# Patient Record
Sex: Male | Born: 1965 | Race: White | Hispanic: No | Marital: Married | Smoking: Never smoker
Health system: Southern US, Community
[De-identification: ages and names within clinical notes are randomized; demographics above are authoritative.]

## PROBLEM LIST (undated history)

## (undated) DIAGNOSIS — M545 Low back pain, unspecified: Secondary | ICD-10-CM

## (undated) DIAGNOSIS — F329 Major depressive disorder, single episode, unspecified: Secondary | ICD-10-CM

## (undated) DIAGNOSIS — K279 Peptic ulcer, site unspecified, unspecified as acute or chronic, without hemorrhage or perforation: Secondary | ICD-10-CM

## (undated) DIAGNOSIS — N19 Unspecified kidney failure: Secondary | ICD-10-CM

## (undated) DIAGNOSIS — D649 Anemia, unspecified: Secondary | ICD-10-CM

## (undated) DIAGNOSIS — E119 Type 2 diabetes mellitus without complications: Secondary | ICD-10-CM

## (undated) DIAGNOSIS — K219 Gastro-esophageal reflux disease without esophagitis: Secondary | ICD-10-CM

## (undated) DIAGNOSIS — I10 Essential (primary) hypertension: Secondary | ICD-10-CM

## (undated) DIAGNOSIS — F32A Depression, unspecified: Secondary | ICD-10-CM

## (undated) HISTORY — DX: Type 2 diabetes mellitus without complications: E11.9

## (undated) HISTORY — DX: Essential (primary) hypertension: I10

## (undated) HISTORY — PX: EXPLORATORY LAPAROTOMY: SUR591

## (undated) HISTORY — DX: Major depressive disorder, single episode, unspecified: F32.9

## (undated) HISTORY — DX: Low back pain: M54.5

## (undated) HISTORY — DX: Unspecified kidney failure: N19

## (undated) HISTORY — PX: LUMBAR FUSION: SHX111

## (undated) HISTORY — DX: Gastro-esophageal reflux disease without esophagitis: K21.9

## (undated) HISTORY — DX: Peptic ulcer, site unspecified, unspecified as acute or chronic, without hemorrhage or perforation: K27.9

## (undated) HISTORY — DX: Low back pain, unspecified: M54.50

## (undated) HISTORY — DX: Anemia, unspecified: D64.9

## (undated) HISTORY — DX: Depression, unspecified: F32.A

---

## 2005-11-07 ENCOUNTER — Emergency Department (HOSPITAL_COMMUNITY): Admission: EM | Admit: 2005-11-07 | Discharge: 2005-11-07 | Payer: Self-pay | Admitting: Emergency Medicine

## 2006-09-01 ENCOUNTER — Ambulatory Visit: Payer: Self-pay | Admitting: Specialist

## 2007-06-30 ENCOUNTER — Ambulatory Visit (HOSPITAL_COMMUNITY): Admission: RE | Admit: 2007-06-30 | Discharge: 2007-06-30 | Payer: Self-pay | Admitting: Neurosurgery

## 2008-10-28 ENCOUNTER — Encounter: Admission: RE | Admit: 2008-10-28 | Discharge: 2008-10-28 | Payer: Self-pay | Admitting: Neurosurgery

## 2008-12-23 ENCOUNTER — Ambulatory Visit (HOSPITAL_COMMUNITY): Admission: RE | Admit: 2008-12-23 | Discharge: 2008-12-24 | Payer: Self-pay | Admitting: Neurosurgery

## 2008-12-24 ENCOUNTER — Ambulatory Visit: Payer: Self-pay | Admitting: Pulmonary Disease

## 2008-12-24 ENCOUNTER — Ambulatory Visit: Payer: Self-pay | Admitting: Surgery

## 2008-12-24 ENCOUNTER — Inpatient Hospital Stay (HOSPITAL_COMMUNITY): Admission: EM | Admit: 2008-12-24 | Discharge: 2009-01-20 | Payer: Self-pay | Admitting: Emergency Medicine

## 2009-01-09 ENCOUNTER — Ambulatory Visit: Payer: Self-pay | Admitting: Physical Medicine & Rehabilitation

## 2009-01-12 ENCOUNTER — Ambulatory Visit: Payer: Self-pay | Admitting: Gastroenterology

## 2009-01-20 ENCOUNTER — Inpatient Hospital Stay (HOSPITAL_COMMUNITY)
Admission: RE | Admit: 2009-01-20 | Discharge: 2009-01-25 | Payer: Self-pay | Admitting: Physical Medicine & Rehabilitation

## 2009-01-21 ENCOUNTER — Ambulatory Visit: Payer: Self-pay | Admitting: Physical Medicine & Rehabilitation

## 2009-01-23 ENCOUNTER — Ambulatory Visit: Payer: Self-pay | Admitting: Vascular Surgery

## 2009-02-06 ENCOUNTER — Ambulatory Visit: Payer: Self-pay | Admitting: Surgery

## 2009-02-08 ENCOUNTER — Encounter
Admission: RE | Admit: 2009-02-08 | Discharge: 2009-05-09 | Payer: Self-pay | Admitting: Physical Medicine & Rehabilitation

## 2009-02-22 ENCOUNTER — Ambulatory Visit: Payer: Self-pay | Admitting: Internal Medicine

## 2009-02-22 DIAGNOSIS — D6489 Other specified anemias: Secondary | ICD-10-CM | POA: Insufficient documentation

## 2009-02-22 DIAGNOSIS — F418 Other specified anxiety disorders: Secondary | ICD-10-CM | POA: Insufficient documentation

## 2009-02-22 DIAGNOSIS — I10 Essential (primary) hypertension: Secondary | ICD-10-CM | POA: Insufficient documentation

## 2009-02-22 DIAGNOSIS — D649 Anemia, unspecified: Secondary | ICD-10-CM | POA: Insufficient documentation

## 2009-02-22 DIAGNOSIS — M109 Gout, unspecified: Secondary | ICD-10-CM | POA: Insufficient documentation

## 2009-02-22 DIAGNOSIS — M545 Low back pain, unspecified: Secondary | ICD-10-CM | POA: Insufficient documentation

## 2009-02-22 DIAGNOSIS — K219 Gastro-esophageal reflux disease without esophagitis: Secondary | ICD-10-CM | POA: Insufficient documentation

## 2009-02-22 LAB — CONVERTED CEMR LAB
ALT: 32 units/L (ref 0–53)
Basophils Relative: 0.5 % (ref 0.0–3.0)
Bilirubin, Direct: 0.3 mg/dL (ref 0.0–0.3)
Chloride: 110 meq/L (ref 96–112)
Eosinophils Relative: 1.8 % (ref 0.0–5.0)
HCT: 34.5 % — ABNORMAL LOW (ref 39.0–52.0)
Hemoglobin: 11.7 g/dL — ABNORMAL LOW (ref 13.0–17.0)
Leukocytes, UA: NEGATIVE
Lymphs Abs: 2.2 10*3/uL (ref 0.7–4.0)
MCV: 86.9 fL (ref 78.0–100.0)
Monocytes Absolute: 0.5 10*3/uL (ref 0.1–1.0)
Neutro Abs: 3.2 10*3/uL (ref 1.4–7.7)
Neutrophils Relative %: 52.6 % (ref 43.0–77.0)
Potassium: 4 meq/L (ref 3.5–5.1)
RBC: 3.98 M/uL — ABNORMAL LOW (ref 4.22–5.81)
Saturation Ratios: 33.1 % (ref 20.0–50.0)
Sodium: 147 meq/L — ABNORMAL HIGH (ref 135–145)
Specific Gravity, Urine: 1.02 (ref 1.000–1.030)
Total Protein: 6.8 g/dL (ref 6.0–8.3)
Urine Glucose: NEGATIVE mg/dL
Urobilinogen, UA: 0.2 (ref 0.0–1.0)
Vitamin B-12: 585 pg/mL (ref 211–911)
WBC: 6 10*3/uL (ref 4.5–10.5)

## 2009-03-06 ENCOUNTER — Encounter
Admission: RE | Admit: 2009-03-06 | Discharge: 2009-04-20 | Payer: Self-pay | Admitting: Physical Medicine & Rehabilitation

## 2009-03-07 ENCOUNTER — Ambulatory Visit: Payer: Self-pay | Admitting: Internal Medicine

## 2009-03-08 ENCOUNTER — Ambulatory Visit: Payer: Self-pay | Admitting: Physical Medicine & Rehabilitation

## 2009-03-27 ENCOUNTER — Telehealth: Payer: Self-pay | Admitting: Internal Medicine

## 2009-03-29 ENCOUNTER — Telehealth: Payer: Self-pay | Admitting: Internal Medicine

## 2009-05-10 ENCOUNTER — Ambulatory Visit: Payer: Self-pay | Admitting: Internal Medicine

## 2009-05-16 ENCOUNTER — Telehealth: Payer: Self-pay | Admitting: Internal Medicine

## 2009-06-07 ENCOUNTER — Ambulatory Visit: Payer: Self-pay | Admitting: Physical Medicine & Rehabilitation

## 2009-06-07 ENCOUNTER — Encounter
Admission: RE | Admit: 2009-06-07 | Discharge: 2009-09-05 | Payer: Self-pay | Admitting: Physical Medicine & Rehabilitation

## 2009-07-10 ENCOUNTER — Ambulatory Visit: Payer: Self-pay | Admitting: Internal Medicine

## 2009-07-13 ENCOUNTER — Ambulatory Visit: Payer: Self-pay | Admitting: Psychology

## 2009-07-24 ENCOUNTER — Ambulatory Visit: Payer: Self-pay | Admitting: Internal Medicine

## 2009-07-28 ENCOUNTER — Ambulatory Visit: Payer: Self-pay | Admitting: Psychology

## 2009-07-31 ENCOUNTER — Other Ambulatory Visit (HOSPITAL_COMMUNITY): Admission: RE | Admit: 2009-07-31 | Discharge: 2009-08-16 | Payer: Self-pay | Admitting: Psychiatry

## 2009-07-31 ENCOUNTER — Ambulatory Visit: Payer: Self-pay | Admitting: Psychiatry

## 2009-08-11 ENCOUNTER — Ambulatory Visit: Payer: Self-pay | Admitting: Psychology

## 2009-08-18 ENCOUNTER — Telehealth: Payer: Self-pay | Admitting: Internal Medicine

## 2009-08-21 ENCOUNTER — Ambulatory Visit: Payer: Self-pay | Admitting: Psychology

## 2009-09-01 ENCOUNTER — Ambulatory Visit: Payer: Self-pay | Admitting: Psychology

## 2009-09-25 ENCOUNTER — Telehealth: Payer: Self-pay | Admitting: Internal Medicine

## 2009-09-27 ENCOUNTER — Telehealth: Payer: Self-pay | Admitting: Internal Medicine

## 2009-12-11 ENCOUNTER — Encounter
Admission: RE | Admit: 2009-12-11 | Discharge: 2009-12-13 | Payer: Self-pay | Admitting: Physical Medicine & Rehabilitation

## 2009-12-13 ENCOUNTER — Ambulatory Visit: Payer: Self-pay | Admitting: Physical Medicine & Rehabilitation

## 2009-12-18 ENCOUNTER — Ambulatory Visit: Payer: Self-pay | Admitting: Internal Medicine

## 2009-12-31 IMAGING — CR DG ABDOMEN 1V
1 series · 1 of 1 positions shown · non-contrast
Comparison: [DATE]

CLINICAL DATA: Hypotension, anemia, tachycardia, postoperative
bleeding evaluate for foreign body

ABDOMEN - 1 VIEW

[view not recorded]
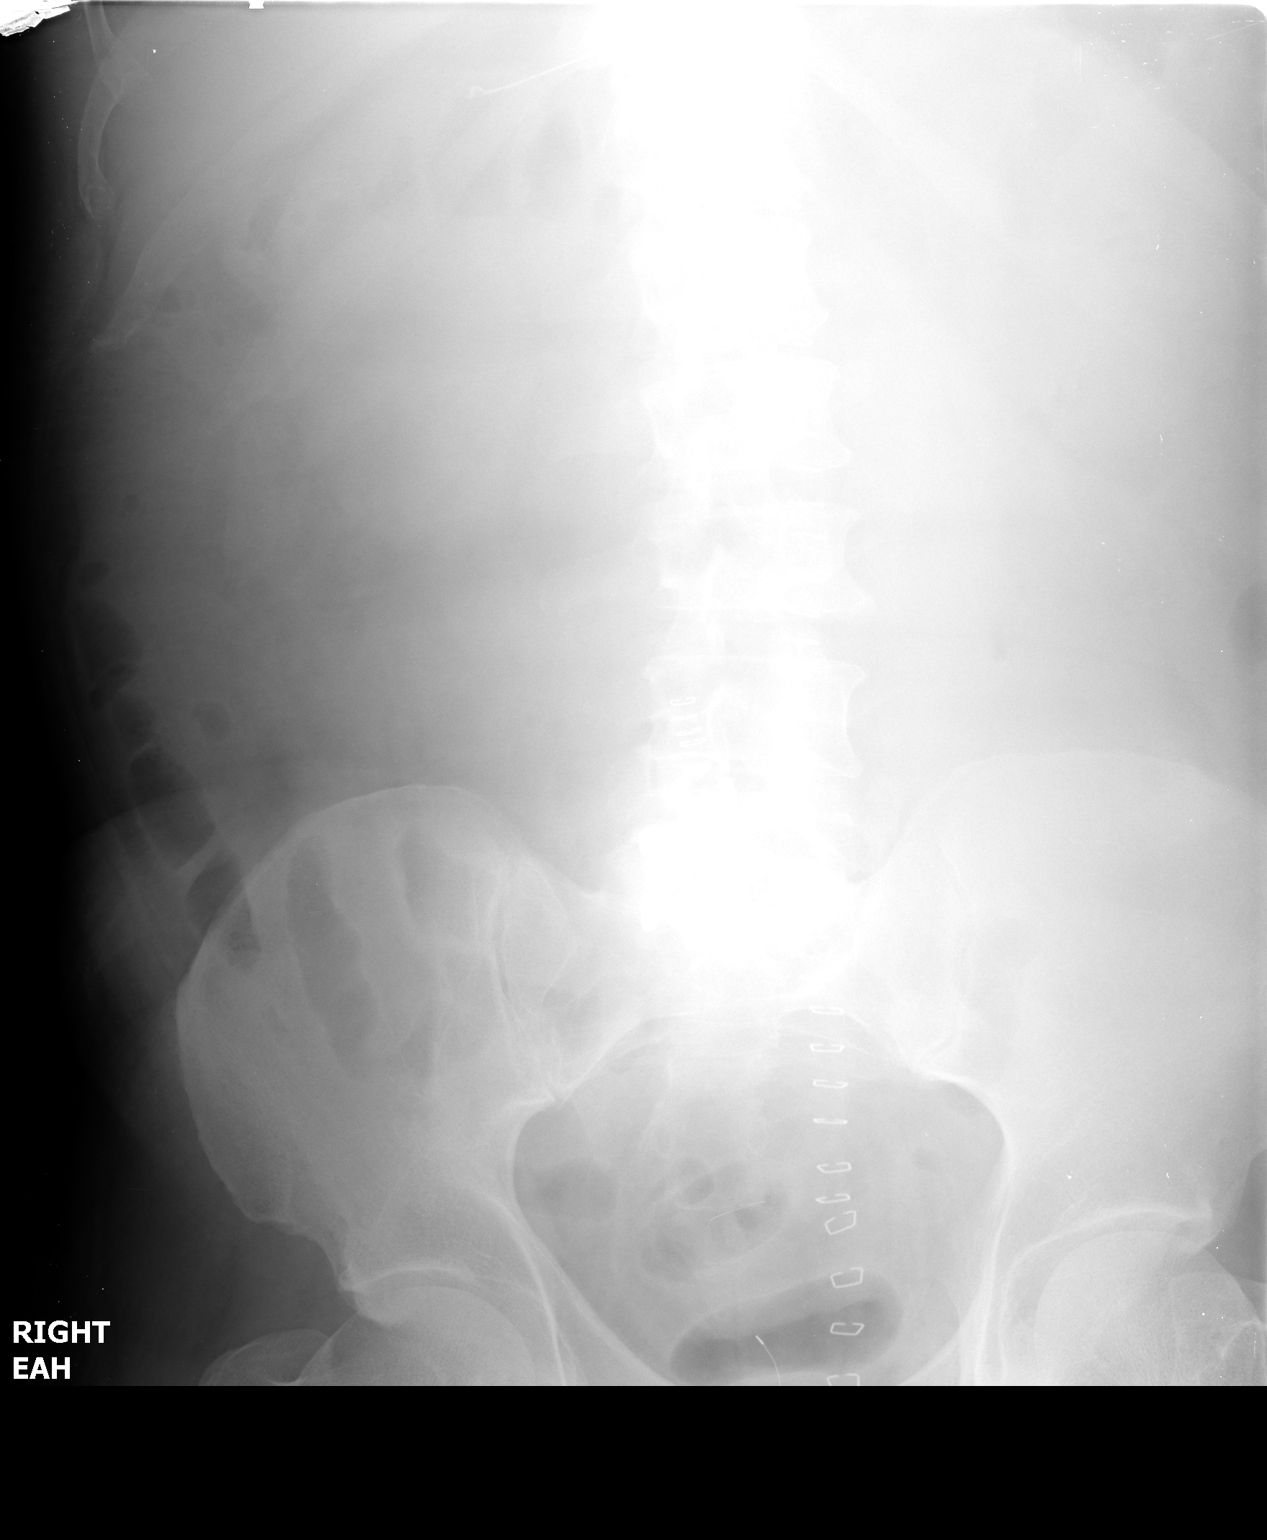

[1 of 1 positions shown; findings below may reference images not displayed]

FINDINGS: Midline staples are noted.  Previous fusion hardware at
L5- S1.  NG tube tip is seen in the distal stomach.  Imaged portion
of the abdomen demonstrates no other unexpected radiopaque foreign
body.  Entire left abdomen is not included on the study.  A rectal
temperature probe is noted.
IMPRESSION: Postop changes, as described.
No radiopaque unexpected foreign bodies in the imaged field.

## 2010-01-02 IMAGING — CR DG ABD PORTABLE 1V
1 series · 1 of 1 positions shown · non-contrast
Comparison: 12/31/2008

CLINICAL DATA: NG tube placement

ABDOMEN - 1 VIEW

[view not recorded]
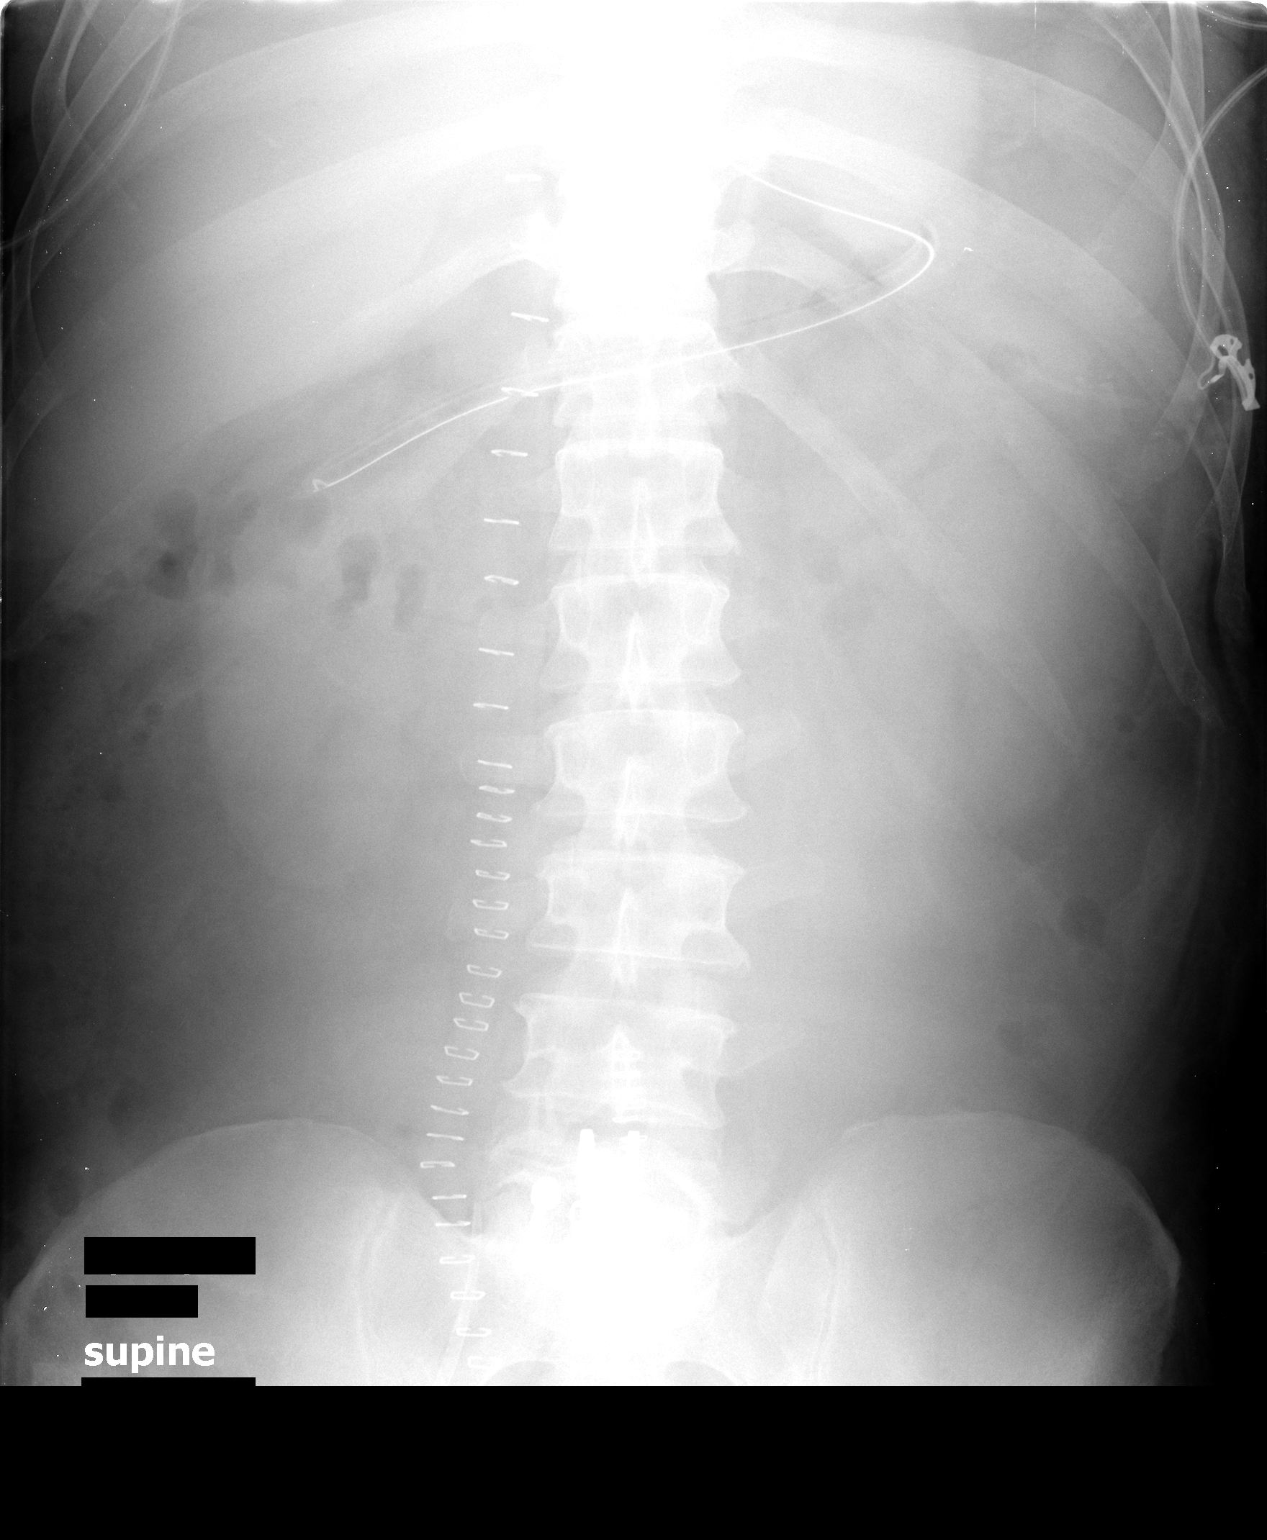

[1 of 1 positions shown; findings below may reference images not displayed]

FINDINGS: An NG tube is in place with its tip in the region of the
duodenal bulb or proximal duodenum.  New skin staples are noted
along the right abdomen and pelvis.  No acute or specific
abnormality of the bowel gas pattern.  A right femoral venous
catheter is in place with its tip in the right common iliac vein.
IMPRESSION: 1.  Tip of the NG tube in the region of the duodenal bulb or
proximal duodenum.
2.  Postoperative changes as above.
3.  No acute findings.

## 2010-01-02 IMAGING — CR DG CHEST 1V PORT
1 series · 1 of 1 positions shown · non-contrast
Comparison: 01/01/2009

CLINICAL DATA: Hypotension/tachycardia

PORTABLE CHEST - 1 VIEW

[view not recorded]
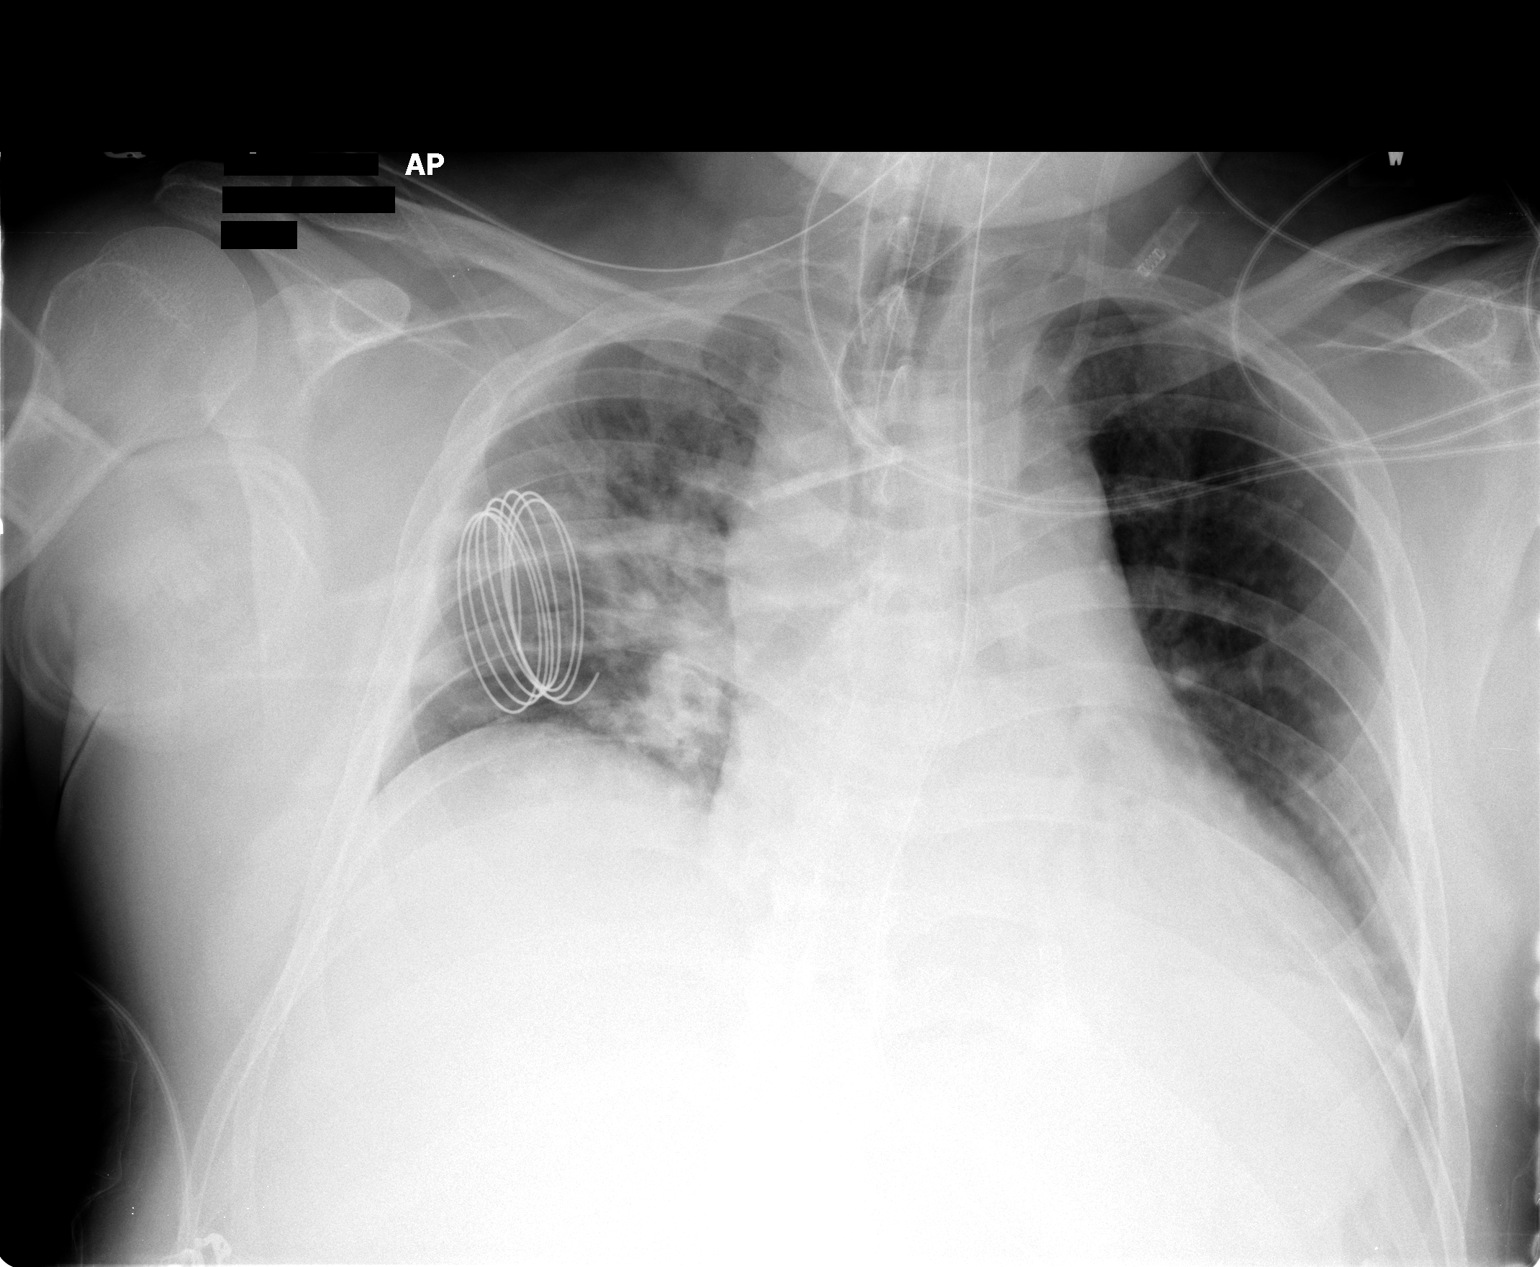

[1 of 1 positions shown; findings below may reference images not displayed]

FINDINGS: Bilateral airspace densities persist with slight interval
improvement of right basilar aeration.  Little overall change.
Support apparatus stable.
IMPRESSION: Slight interval improvement in right lung base aeration - otherwise
no significant change.

## 2010-01-05 IMAGING — CR DG CHEST 1V PORT
1 series · 1 of 1 positions shown · non-contrast
Comparison: 01/02/2009

CLINICAL DATA: Pulmonary edema.  Tachycardia.

PORTABLE CHEST - 1 VIEW

[view not recorded]
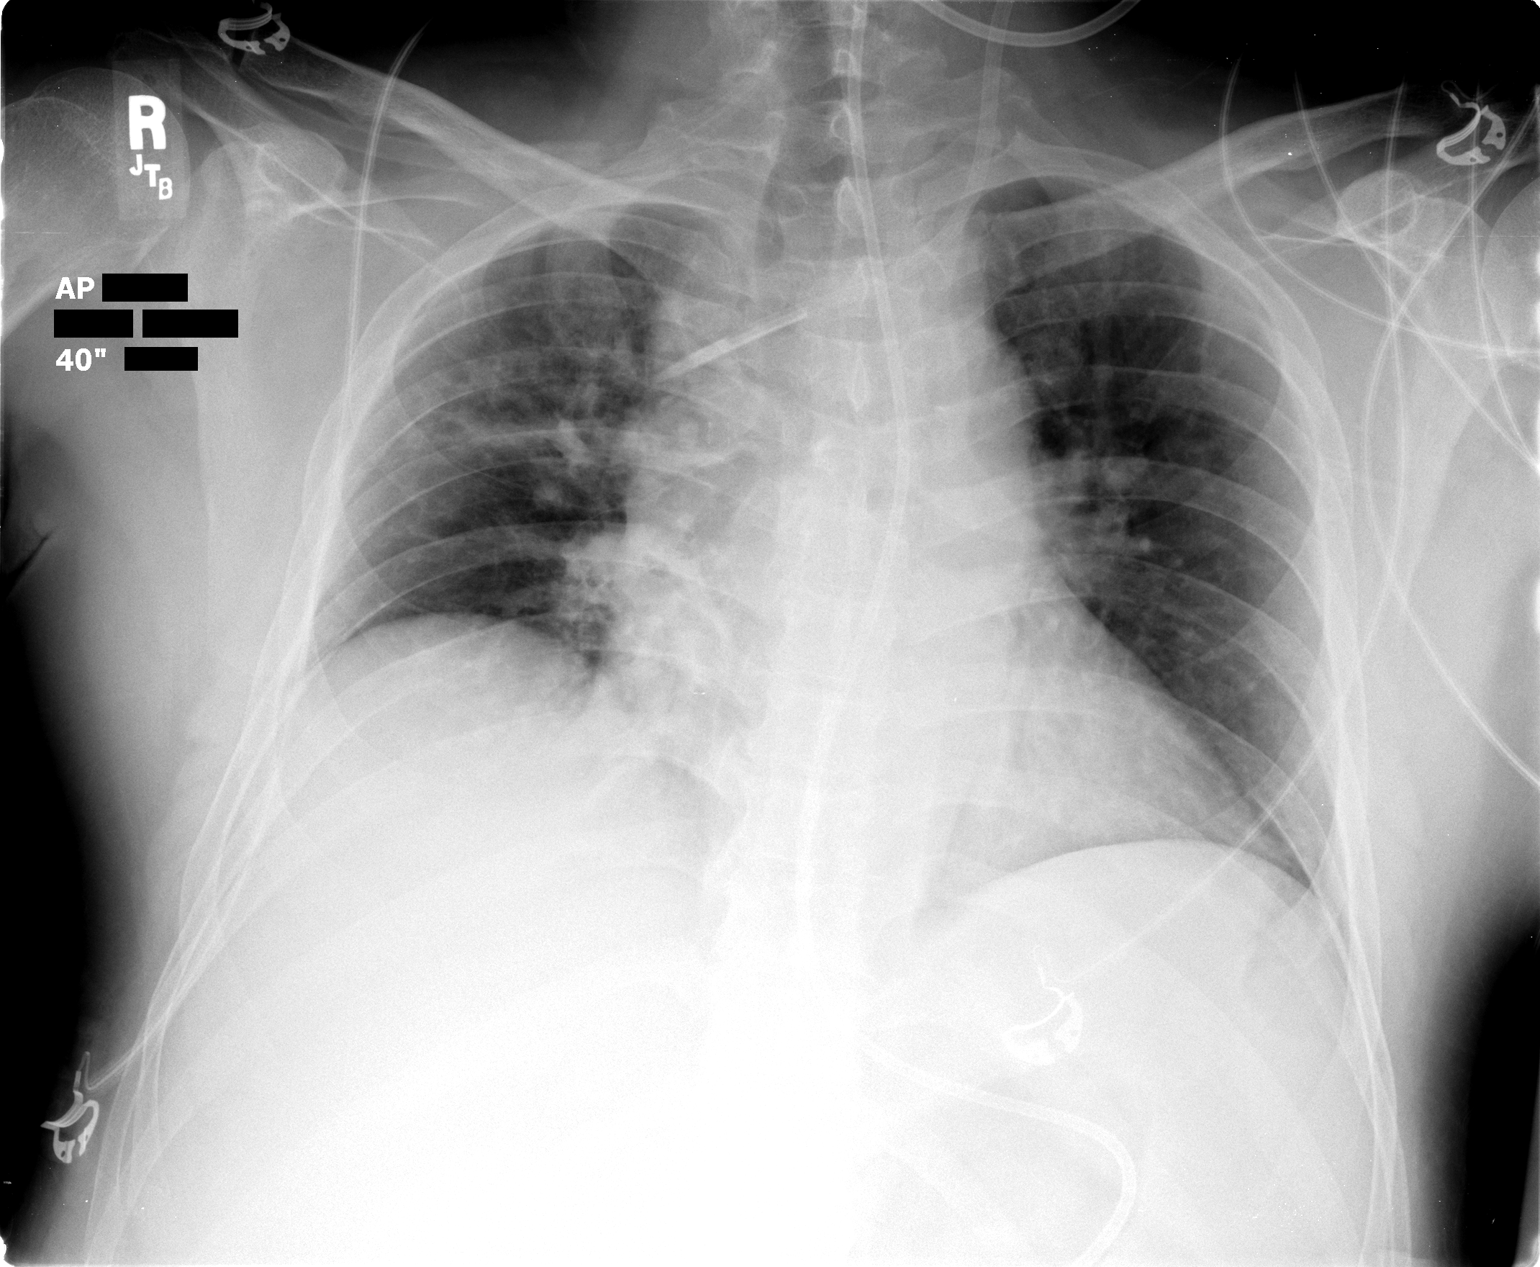

[1 of 1 positions shown; findings below may reference images not displayed]

FINDINGS: Feeding tube extends beyond the  inferior aspect of the
film.  Interval extubation.  Removal of nasogastric tube.

Left IJ central line unchanged tip at high SVC.

Midline trachea. Mild cardiomegaly.  Moderate right hemidiaphragm
elevation. No pneumothorax.  Improved left base air space disease.
Patchy right-sided airspace disease is slightly improved. No
congestive failure.
IMPRESSION: 1.  Improvement in right upper lobe and left lower lobe airspace
disease.
2.  Cardiomegaly without congestive failure.
3.  Interval extubation.

## 2010-01-08 ENCOUNTER — Telehealth: Payer: Self-pay | Admitting: Internal Medicine

## 2010-01-09 IMAGING — CR DG CHEST 1V PORT
1 series · 1 of 1 positions shown · non-contrast
Comparison: 01/05/2009

CLINICAL DATA: Shortness of breath and postoperative bleeding.

PORTABLE CHEST - 1 VIEW

[AP]
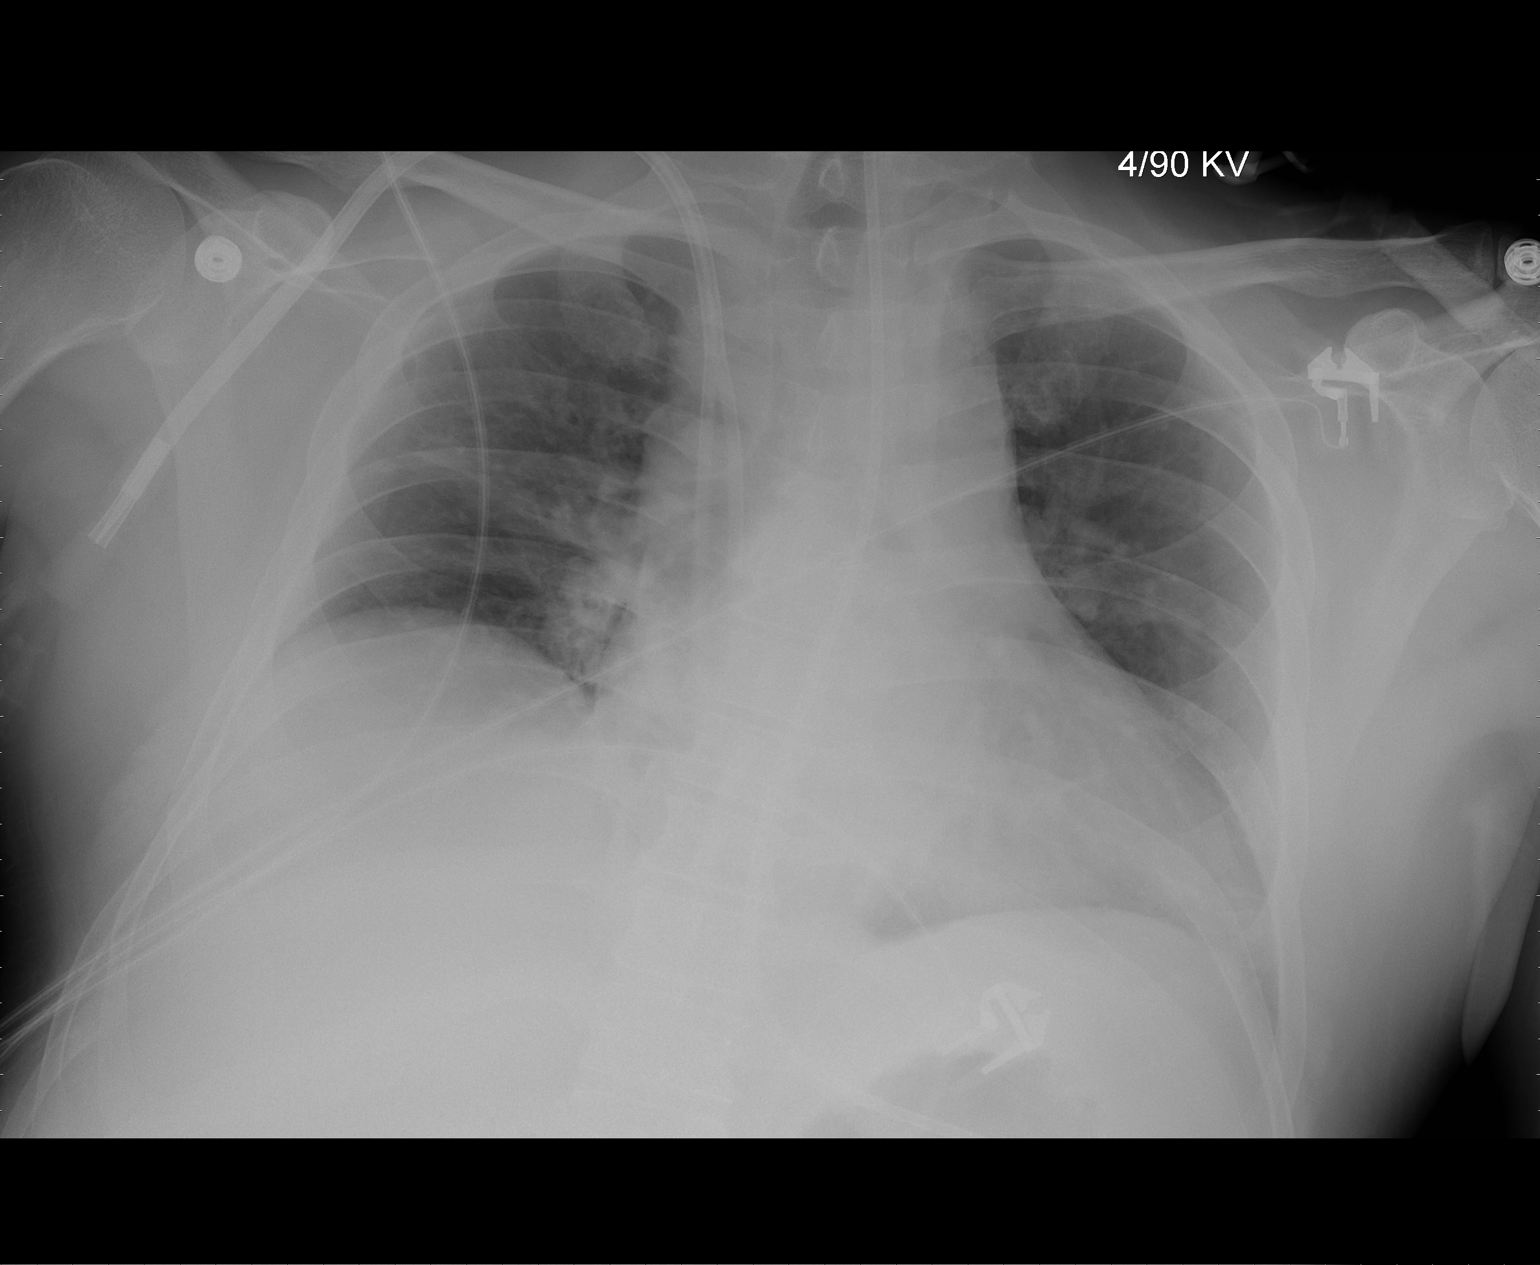

[1 of 1 positions shown; findings below may reference images not displayed]

FINDINGS: A right IJ catheter is noted with tips overlying the
lower SVC and upper right atrium.
A small bore feeding tube entering the stomach is again identified.
Slight improved aeration bilaterally is noted. Continued right lung
atelectasis again noted.
The cardiomediastinal silhouette is stable.
No large pleural effusions or evidence of pneumothorax is noted.
IMPRESSION: Slight improved bilateral aeration otherwise stable chest.

## 2010-02-19 ENCOUNTER — Ambulatory Visit: Payer: Self-pay | Admitting: Internal Medicine

## 2010-04-05 ENCOUNTER — Ambulatory Visit: Payer: Self-pay | Admitting: Internal Medicine

## 2010-04-05 DIAGNOSIS — M542 Cervicalgia: Secondary | ICD-10-CM | POA: Insufficient documentation

## 2010-04-05 DIAGNOSIS — M5412 Radiculopathy, cervical region: Secondary | ICD-10-CM | POA: Insufficient documentation

## 2010-04-06 ENCOUNTER — Telehealth: Payer: Self-pay | Admitting: Internal Medicine

## 2010-04-26 ENCOUNTER — Telehealth: Payer: Self-pay | Admitting: Internal Medicine

## 2010-05-04 ENCOUNTER — Ambulatory Visit: Payer: Self-pay | Admitting: Internal Medicine

## 2010-05-04 DIAGNOSIS — L03039 Cellulitis of unspecified toe: Secondary | ICD-10-CM

## 2010-05-04 DIAGNOSIS — L02619 Cutaneous abscess of unspecified foot: Secondary | ICD-10-CM | POA: Insufficient documentation

## 2010-05-18 ENCOUNTER — Ambulatory Visit: Payer: Self-pay | Admitting: Internal Medicine

## 2010-07-16 ENCOUNTER — Telehealth: Payer: Self-pay | Admitting: Internal Medicine

## 2010-08-01 ENCOUNTER — Telehealth: Payer: Self-pay | Admitting: Internal Medicine

## 2010-08-29 ENCOUNTER — Telehealth: Payer: Self-pay | Admitting: Internal Medicine

## 2010-09-19 ENCOUNTER — Ambulatory Visit: Payer: Self-pay | Admitting: Internal Medicine

## 2010-09-19 DIAGNOSIS — F528 Other sexual dysfunction not due to a substance or known physiological condition: Secondary | ICD-10-CM | POA: Insufficient documentation

## 2010-10-05 ENCOUNTER — Telehealth: Payer: Self-pay | Admitting: Internal Medicine

## 2010-11-13 NOTE — Progress Notes (Signed)
  Phone Note Outgoing Call   Summary of Call: LA: please let him know that his neck xray is abnormal and I think he should have an MRI done TJ Initial call taken by: Etta Grandchild MD,  April 06, 2010 6:48 AM  Follow-up for Phone Call        Patient wife notified.Marland KitchenMarland KitchenAlvy Beal Archie CMA  April 06, 2010 2:11 PM  Follow-up by: Etta Grandchild MD,  April 06, 2010 6:47 AM

## 2010-11-13 NOTE — Assessment & Plan Note (Signed)
Summary: neck & left shoulder pain/#/cd   Vital Signs:  Patient profile:   45 year old male Height:      70 inches Weight:      195.2 pounds O2 Sat:      98 % on Room air Temp:     97.7 degrees F oral Pulse rate:   70 / minute Pulse rhythm:   regular Resp:     16 per minute BP sitting:   138 / 80  (left arm) Cuff size:   regular  Vitals Entered By: Lanier Prude, CMA(AAMA) (April 05, 2010 3:51 PM)  O2 Flow:  Room air CC: lt side neck/shoulder painx 6 weeks, Neck pain Is Patient Diabetic? No Pain Assessment Patient in pain? no      Comments pt states he takes Adderrall 3 once daily.   Primary Care Provider:  Etta Grandchild MD  CC:  lt side neck/shoulder painx 6 weeks and Neck pain.  History of Present Illness:  Neck Pain      This is a 45 year old man who presents with Neck pain.  The problem began 4-8 weeks ago.  The intensity is described as moderate.  The patient reports left neck pain, but denies right neck pain and midline neck pain.  The patient denies the following associated symptoms: numbness, weakness, impaired coordination, gait disturbance, tingling/parasthesias, fever, bladder dysfunction, bowel dysfunction, locking, clicking, and impaired neck ROM.  The pain is described as dull, intermittent, and radiates to the left arm.  The pain is better with rest and muscle relaxants.    Preventive Screening-Counseling & Management  Alcohol-Tobacco     Alcohol drinks/day: 0     Smoking Status: never  Hep-HIV-STD-Contraception     Hepatitis Risk: no risk noted     HIV Risk: no risk noted     STD Risk: no risk noted      Sexual History:  currently monogamous.        Drug Use:  no.        Blood Transfusions:  yes.    Medications Prior to Update: 1)  Toprol Xl 100 Mg Xr24h-Tab (Metoprolol Succinate) .... One By Mouth Once Daily 2)  Alprazolam 0.5 Mg Tabs (Alprazolam) .... Take 1 Tab By Mouth At Bedtime 3)  Trazodone Hcl 50 Mg Tabs (Trazodone Hcl) .... 2-4 Tabs  Once Daily 4)  Adderall 20 Mg Tabs (Amphetamine-Dextroamphetamine) .... Take 2 Tablet By Mouth Two Times A Day 5)  Hydrocodone-Acetaminophen 10-650 Mg Tabs (Hydrocodone-Acetaminophen) .... One By Mouth Three Times A Day As Needed For Pain 6)  Lexapro 20 Mg Tabs (Escitalopram Oxalate) .... One By Mouth Once Daily  Current Medications (verified): 1)  Toprol Xl 100 Mg Xr24h-Tab (Metoprolol Succinate) .... One By Mouth Once Daily 2)  Alprazolam 0.5 Mg Tabs (Alprazolam) .... Take 1 Tab By Mouth At Bedtime 3)  Trazodone Hcl 50 Mg Tabs (Trazodone Hcl) .... 2-4 Tabs Once Daily 4)  Adderall 20 Mg Tabs (Amphetamine-Dextroamphetamine) .... Take 2 Tablet By Mouth Two Times A Day 5)  Hydrocodone-Acetaminophen 10-650 Mg Tabs (Hydrocodone-Acetaminophen) .... One By Mouth Three Times A Day As Needed For Pain 6)  Lexapro 20 Mg Tabs (Escitalopram Oxalate) .... One By Mouth Once Daily 7)  Amrix 15 Mg Xr24h-Cap (Cyclobenzaprine Hcl) .... One By Mouth Once Daily As Needed For Neck Pain  Allergies (verified): 1)  ! Nsaids  Past History:  Past Medical History: Last updated: 07/10/2009 Anemia-NOS GERD Gout Hypertension Low back pain- Dr. Mikal Plane Peptic ulcer  disease Renal failure- Dr. Kathrene Bongo depression  Past Surgical History: Last updated: 02/22/2009 Lumbar fusion Laparotomy-exploratory  Family History: Last updated: 02/22/2009 Family History Diabetes 1st degree relative Family History Hypertension  Social History: Last updated: 02/22/2009 Occupation: Chartered certified accountant Married Never Smoked Alcohol use-no Drug use-no Regular exercise-no  Risk Factors: Alcohol Use: 0 (04/05/2010) Exercise: no (02/22/2009)  Risk Factors: Smoking Status: never (04/05/2010)  Family History: Reviewed history from 02/22/2009 and no changes required. Family History Diabetes 1st degree relative Family History Hypertension  Social History: Reviewed history from 02/22/2009 and no changes  required. Occupation: Chartered certified accountant Married Never Smoked Alcohol use-no Drug use-no Regular exercise-no Hepatitis Risk:  no risk noted HIV Risk:  no risk noted STD Risk:  no risk noted  Review of Systems  The patient denies anorexia, fever, weight loss, chest pain, syncope, dyspnea on exertion, peripheral edema, prolonged cough, headaches, hemoptysis, abdominal pain, hematuria, suspicious skin lesions, and enlarged lymph nodes.    Physical Exam  General:  alert, well-developed, well-nourished, and cooperative to examination.   appropriate dress, normal appearance, healthy-appearing, cooperative to examination, and good hygiene.   Head:  normocephalic, atraumatic, and no abnormalities observed.   Mouth:  Oral mucosa and oropharynx without lesions or exudates.  Teeth in good repair. Neck:  supple, full ROM, and no masses.   Lungs:  normal respiratory effort, no intercostal retractions, no accessory muscle use, normal breath sounds, no dullness, and no fremitus.   Heart:  normal rate, regular rhythm, no murmur, no gallop, no rub, and no JVD.   Abdomen:  Bowel sounds positive,abdomen soft and non-tender without masses, organomegaly or hernias noted. Msk:  normal ROM, no joint tenderness, no joint swelling, no joint warmth, no redness over joints, no joint deformities, no joint instability, and no crepitation.   Pulses:  R and L carotid,radial,femoral,dorsalis pedis and posterior tibial pulses are full and equal bilaterally Extremities:  No clubbing, cyanosis, edema, or deformity noted with normal full range of motion of all joints.   Neurologic:  No cranial nerve deficits noted. Station and gait are normal. Plantar reflexes are down-going bilaterally. DTRs are symmetrical throughout. Sensory, motor and coordinative functions appear intact. Skin:  turgor normal, color normal, no rashes, no suspicious lesions, no ecchymoses, no petechiae, no purpura, no ulcerations, and no edema.   Cervical Nodes:   No lymphadenopathy noted Axillary Nodes:  No palpable lymphadenopathy Psych:  Cognition and judgment appear intact. Alert and cooperative with normal attention span and concentration. No apparent delusions, illusions, hallucinations   Detailed Back/Spine Exam  General:    Well-developed, well-nourished, in no acute distress; alert and oriented x 3.    Gait:    Normal heel-toe gait pattern bilaterally.    Skin:    Intact with no erythema; no scarring.    Cervical Exam:  Inspection-deformity:    Normal Palpation-spinal tenderness:  Normal Range of Motion:    Forward Flexion:   60 degrees    Hyperextension:   75 degrees    Right Lat. Flexion:   45 degrees    Left Lat. Flexion:   45 degrees    Right Lat. Rotation:   80 degrees    Left Lat. Rotation:   80 degrees Spurling Maneuver:    negative Hoffman's Sign:    Right:  negative    Left:  negative   Impression & Recommendations:  Problem # 1:  NECK PAIN, LEFT (ICD-723.1) Assessment New  His updated medication list for this problem includes:    Hydrocodone-acetaminophen 10-650 Mg  Tabs (Hydrocodone-acetaminophen) ..... One by mouth three times a day as needed for pain    Amrix 15 Mg Xr24h-cap (Cyclobenzaprine hcl) ..... One by mouth once daily as needed for neck pain  Orders: T-Cervical Spine Comp 4 Views (72050TC)  Problem # 2:  CERVICAL RADICULOPATHY, LEFT (ICD-723.4) Assessment: New will start with plain films but he may need an MRI Orders: T-Cervical Spine Comp 4 Views (72050TC)  Complete Medication List: 1)  Toprol Xl 100 Mg Xr24h-tab (Metoprolol succinate) .... One by mouth once daily 2)  Alprazolam 0.5 Mg Tabs (Alprazolam) .... Take 1 tab by mouth at bedtime 3)  Trazodone Hcl 50 Mg Tabs (Trazodone hcl) .... 2-4 tabs once daily 4)  Adderall 20 Mg Tabs (Amphetamine-dextroamphetamine) .... Take 2 tablet by mouth two times a day 5)  Hydrocodone-acetaminophen 10-650 Mg Tabs (Hydrocodone-acetaminophen) .... One by  mouth three times a day as needed for pain 6)  Lexapro 20 Mg Tabs (Escitalopram oxalate) .... One by mouth once daily 7)  Amrix 15 Mg Xr24h-cap (Cyclobenzaprine hcl) .... One by mouth once daily as needed for neck pain  Patient Instructions: 1)  Please schedule a follow-up appointment in 2 weeks. Prescriptions: HYDROCODONE-ACETAMINOPHEN 10-650 MG TABS (HYDROCODONE-ACETAMINOPHEN) One by mouth three times a day as needed for pain  #90 x 2   Entered and Authorized by:   Etta Grandchild MD   Signed by:   Etta Grandchild MD on 04/05/2010   Method used:   Print then Give to Patient   RxID:   [id number] AMRIX 15 MG XR24H-CAP (CYCLOBENZAPRINE HCL) One by mouth once daily as needed for neck pain  #10 x 0   Entered and Authorized by:   Etta Grandchild MD   Signed by:   Etta Grandchild MD on 04/05/2010   Method used:   Samples Given   RxID:   [id number]

## 2010-11-13 NOTE — Progress Notes (Signed)
Summary: refill request  Phone Note Refill Request Message from:  Fax from Pharmacy on July 16, 2010 3:08 PM  Refills Requested: Medication #1:  HYDROCODONE-ACETAMINOPHEN 10-650 MG TABS One by mouth three times a day as needed for pain   Dosage confirmed as above?Dosage Confirmed   Supply Requested: 1 month   Last Refilled: 06/14/2010  Is this ok to refill for pt?  [facility]  Initial call taken by: Rock Nephew CMA,  July 16, 2010 3:09 PM  Follow-up for Phone Call        ok RF X three Follow-up by: Etta Grandchild MD,  July 16, 2010 3:21 PM    Prescriptions: HYDROCODONE-ACETAMINOPHEN 10-650 MG TABS (HYDROCODONE-ACETAMINOPHEN) One by mouth three times a day as needed for pain  #90 x 3   Entered by:   Rock Nephew CMA   Authorized by:   Etta Grandchild MD   Signed by:   Rock Nephew CMA on 07/16/2010   Method used:   Telephoned to ...       [id number] ([facility]       [address]       [place], [place]  [address]       Ph: [phone]       Fax: [phone]   RxID:   [id number]

## 2010-11-13 NOTE — Assessment & Plan Note (Signed)
Summary: left big toe ?infection/inj/cd   Vital Signs:  Patient profile:   45 year old male Height:      70 inches Weight:      197 pounds BMI:     28.37 O2 Sat:      97 % on Room air Temp:     97.0 degrees F oral Pulse rate:   64 / minute Pulse rhythm:   regular Resp:     16 per minute BP sitting:   112 / 70  (left arm) Cuff size:   large  Vitals Entered By: Rock Nephew CMA (May 04, 2010 11:20 AM)  Nutrition Counseling: Patient's BMI is greater than 25 and therefore counseled on weight management options.  O2 Flow:  Room air  Primary Care Provider:  Etta Grandchild MD   History of Present Illness: He returns c/o 3 week hx. of worsening pain, redness, and swelling in his left great toe.  Current Medications (verified): 1)  Toprol Xl 100 Mg Xr24h-Tab (Metoprolol Succinate) .... One By Mouth Once Daily 2)  Alprazolam 0.5 Mg Tabs (Alprazolam) .... Take 1 Tab By Mouth At Bedtime 3)  Trazodone Hcl 50 Mg Tabs (Trazodone Hcl) .... 2-4 Tabs Once Daily 4)  Adderall 20 Mg Tabs (Amphetamine-Dextroamphetamine) .... Take 2 Tablet By Mouth Two Times A Day 5)  Hydrocodone-Acetaminophen 10-650 Mg Tabs (Hydrocodone-Acetaminophen) .... One By Mouth Three Times A Day As Needed For Pain 6)  Lexapro 20 Mg Tabs (Escitalopram Oxalate) .... One By Mouth Once Daily 7)  Amrix 15 Mg Xr24h-Cap (Cyclobenzaprine Hcl) .... One By Mouth Once Daily As Needed For Neck Pain  Allergies (verified): 1)  ! Nsaids  Review of Systems  The patient denies anorexia, fever, and abdominal pain.   General:  Denies chills, fatigue, fever, loss of appetite, and malaise.  Physical Exam  General:  Well-developed, well-nourished, in no acute distress; alert and oriented x 3.   Head:  normocephalic, atraumatic, no abnormalities observed, and no abnormalities palpated.   Eyes:  vision grossly intact and pupils equal.   Mouth:  Oral mucosa and oropharynx without lesions or exudates.  Teeth in good repair. Neck:   supple, full ROM, and no masses.   Lungs:  normal respiratory effort, no intercostal retractions, no accessory muscle use, normal breath sounds, no dullness, and no fremitus.   Heart:  normal rate, regular rhythm, no murmur, no gallop, no rub, and no JVD.   Abdomen:  Bowel sounds positive,abdomen soft and non-tender without masses, organomegaly or hernias noted. Msk:  normal ROM, no joint deformities, no joint instability, and no crepitation.   Pulses:  R and L carotid,radial,femoral,dorsalis pedis and posterior tibial pulses are full and equal bilaterally Extremities:  there is diffuse erythema and warmth on the dorsum of the left great toe but the skin is intact and there is no induration, fluctuance, paronychia, or exudate. There is no proximal streaking. The nail bed and nail plate are intact with no pus. Neurologic:  No cranial nerve deficits noted. Station and gait are normal. Plantar reflexes are down-going bilaterally. DTRs are symmetrical throughout. Sensory, motor and coordinative functions appear intact. Skin:  no rashes, no suspicious lesions, no ecchymoses, no petechiae, no purpura, no ulcerations, and no edema.   Psych:  Cognition and judgment appear intact. Alert and cooperative with normal attention span and concentration. No apparent delusions, illusions, hallucinations   Impression & Recommendations:  Problem # 1:  CELLULITIS/ABSCESS, TOE NOS (ICD-681.10) Assessment New  His updated medication list  for this problem includes:    Ceftin 500 Mg Tab (Cefuroxime axetil) .Marland Kitchen... Take one (1) tablet by mouth two (2) times a day x 14 days    Sulfamethoxazole-tmp Ds 800-160 Mg Tab (Trimethoprim-sulfamethoxazole) .Marland Kitchen... Take 1 tablet by mouth morning and night for 14 days  Elevate affected area. Warm moist compresses for 20 minutes every 2 hours while awake. Take antibiotics as directed and take acetaminophen as needed. To be seen in 48-72 hours if no improvement, sooner if  worse.  Complete Medication List: 1)  Toprol Xl 100 Mg Xr24h-tab (Metoprolol succinate) .... One by mouth once daily 2)  Alprazolam 0.5 Mg Tabs (Alprazolam) .... Take 1 tab by mouth at bedtime 3)  Trazodone Hcl 50 Mg Tabs (Trazodone hcl) .... 2-4 tabs once daily 4)  Adderall 20 Mg Tabs (Amphetamine-dextroamphetamine) .... Take 2 tablet by mouth two times a day 5)  Hydrocodone-acetaminophen 10-650 Mg Tabs (Hydrocodone-acetaminophen) .... One by mouth three times a day as needed for pain 6)  Lexapro 20 Mg Tabs (Escitalopram oxalate) .... One by mouth once daily 7)  Amrix 15 Mg Xr24h-cap (Cyclobenzaprine hcl) .... One by mouth once daily as needed for neck pain 8)  Ceftin 500 Mg Tab (Cefuroxime axetil) .... Take one (1) tablet by mouth two (2) times a day x 14 days 9)  Sulfamethoxazole-tmp Ds 800-160 Mg Tab (Trimethoprim-sulfamethoxazole) .... Take 1 tablet by mouth morning and night for 14 days  Patient Instructions: 1)  Please schedule a follow-up appointment in 2 weeks. 2)  Take your antibiotic as prescribed until ALL of it is gone, but stop if you develop a rash or swelling and contact our office as soon as possible. Prescriptions: SULFAMETHOXAZOLE-TMP DS 800-160 MG TAB (TRIMETHOPRIM-SULFAMETHOXAZOLE) Take 1 tablet by mouth morning and night for 14 days  #28 x 1   Entered and Authorized by:   Etta Grandchild MD   Signed by:   Etta Grandchild MD on 05/04/2010   Method used:   Electronically to        [facility]. [phone]* (retail)       [address]       [place], [place]  [address]       Ph: [phone]       Fax: [phone]   RxID:   [id number] CEFTIN 500 MG TAB (CEFUROXIME AXETIL) Take one (1) tablet by mouth two (2) times a day X 14 days  #28 x 1   Entered and Authorized by:   Etta Grandchild MD   Signed by:   Etta Grandchild MD on 05/04/2010   Method used:   Electronically to        [facility]. [phone]* (retail)        [address]       [place], [place]  [address]       Ph: [phone]       Fax: [phone]   RxID:   [id number]    Immunization History:  Tetanus/Td Immunization History:    Tetanus/Td:  historical (10/14/2008)

## 2010-11-13 NOTE — Assessment & Plan Note (Signed)
Summary: 2 wk rov [name]   #   Vital Signs:  Patient profile:   45 year old male Height:      70 inches Weight:      203 pounds BMI:     29.23 O2 Sat:      98 % on Room air Temp:     97.2 degrees F oral Pulse rate:   80 / minute Pulse rhythm:   regular Resp:     16 per minute BP sitting:   124 / 80  (left arm) Cuff size:   large  Vitals Entered By: Rock Nephew CMA (May 18, 2010 3:44 PM)  O2 Flow:  Room air CC: follow-up visit// Left toe recheck Is Patient Diabetic? No   Primary Care Provider:  Etta Grandchild MD  CC:  follow-up visit// Left toe recheck.  History of Present Illness: He returns for f/up on left great toe infection-it is doing better with no pain, redness, swelling, or drainage. He has tolerated the antibiotics well.  Preventive Screening-Counseling & Management  Alcohol-Tobacco     Alcohol drinks/day: 0     Smoking Status: never  Medications Prior to Update: 1)  Toprol Xl 100 Mg Xr24h-Tab (Metoprolol Succinate) .... One By Mouth Once Daily 2)  Alprazolam 0.5 Mg Tabs (Alprazolam) .... Take 1 Tab By Mouth At Bedtime 3)  Trazodone Hcl 50 Mg Tabs (Trazodone Hcl) .... 2-4 Tabs Once Daily 4)  Adderall 20 Mg Tabs (Amphetamine-Dextroamphetamine) .... Take 2 Tablet By Mouth Two Times A Day 5)  Hydrocodone-Acetaminophen 10-650 Mg Tabs (Hydrocodone-Acetaminophen) .... One By Mouth Three Times A Day As Needed For Pain 6)  Lexapro 20 Mg Tabs (Escitalopram Oxalate) .... One By Mouth Once Daily 7)  Amrix 15 Mg Xr24h-Cap (Cyclobenzaprine Hcl) .... One By Mouth Once Daily As Needed For Neck Pain  Current Medications (verified): 1)  Toprol Xl 100 Mg Xr24h-Tab (Metoprolol Succinate) .... One By Mouth Once Daily 2)  Alprazolam 0.5 Mg Tabs (Alprazolam) .... Take 1 Tab By Mouth At Bedtime 3)  Trazodone Hcl 50 Mg Tabs (Trazodone Hcl) .... 2-4 Tabs Once Daily 4)  Adderall 20 Mg Tabs (Amphetamine-Dextroamphetamine) .... Take 2 Tablet By Mouth Two Times A Day 5)   Hydrocodone-Acetaminophen 10-650 Mg Tabs (Hydrocodone-Acetaminophen) .... One By Mouth Three Times A Day As Needed For Pain 6)  Lexapro 20 Mg Tabs (Escitalopram Oxalate) .... One By Mouth Once Daily 7)  Amrix 15 Mg Xr24h-Cap (Cyclobenzaprine Hcl) .... One By Mouth Once Daily As Needed For Neck Pain  Allergies (verified): 1)  ! Nsaids  Past History:  Past Medical History: Last updated: 07/10/2009 Anemia-NOS GERD Gout Hypertension Low back pain- Dr. Mikal Plane Peptic ulcer disease Renal failure- Dr. Kathrene Bongo depression  Past Surgical History: Last updated: 02/22/2009 Lumbar fusion Laparotomy-exploratory  Family History: Last updated: 02/22/2009 Family History Diabetes 1st degree relative Family History Hypertension  Social History: Last updated: 02/22/2009 Occupation: Chartered certified accountant Married Never Smoked Alcohol use-no Drug use-no Regular exercise-no  Risk Factors: Alcohol Use: 0 (05/18/2010) Exercise: no (02/22/2009)  Risk Factors: Smoking Status: never (05/18/2010)  Family History: Reviewed history from 02/22/2009 and no changes required. Family History Diabetes 1st degree relative Family History Hypertension  Social History: Reviewed history from 02/22/2009 and no changes required. 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 MS:  Denies joint pain, joint redness, joint swelling, low back pain,  mid back pain, muscle aches, muscle weakness, and stiffness.  Physical Exam  General:  Well-developed, well-nourished, in no acute distress; alert and oriented x 3.   Head:  normocephalic, atraumatic, no abnormalities observed, and no abnormalities palpated.   Mouth:  Oral mucosa and oropharynx without lesions or exudates.  Teeth in good repair. Neck:  supple, full ROM, and no masses.     Lungs:  normal respiratory effort, no intercostal retractions, no accessory muscle use, normal breath sounds, no dullness, and no fremitus.   Heart:  normal rate, regular rhythm, no murmur, no gallop, no rub, and no JVD.   Abdomen:  Bowel sounds positive,abdomen soft and non-tender without masses, organomegaly or hernias noted. Msk:  normal ROM, no joint deformities, no joint instability, and no crepitation.   Pulses:  R and L carotid,radial,femoral,dorsalis pedis and posterior tibial pulses are full and equal bilaterally Extremities:  there is no erythema or warmth on the dorsum of the left great toe but the skin has a small area that has scaled and there is no induration, fluctuance, paronychia, or exudate. There is no proximal streaking. The nail bed and nail plate are intact with no pus. Neurologic:  No cranial nerve deficits noted. Station and gait are normal. Plantar reflexes are down-going bilaterally. DTRs are symmetrical throughout. Sensory, motor and coordinative functions appear intact. Skin:  no rashes, no suspicious lesions, no ecchymoses, no petechiae, no purpura, no ulcerations, and no edema.   Cervical Nodes:  no anterior cervical adenopathy and no posterior cervical adenopathy.   Axillary Nodes:  no R axillary adenopathy and no L axillary adenopathy.   Psych:  Cognition and judgment appear intact. Alert and cooperative with normal attention span and concentration. No apparent delusions, illusions, hallucinations   Impression & Recommendations:  Problem # 1:  CELLULITIS/ABSCESS, TOE NOS (ICD-681.10) Assessment Improved  Problem # 2:  NECK PAIN, LEFT (ICD-723.1) Assessment: Improved  His updated medication list for this problem includes:    Hydrocodone-acetaminophen 10-650 Mg Tabs (Hydrocodone-acetaminophen) ..... One by mouth three times a day as needed for pain    Amrix 15 Mg Xr24h-cap (Cyclobenzaprine hcl) ..... One by mouth once daily as needed for neck pain  Problem #  3:  HYPERTENSION (ICD-401.9) Assessment: Improved  His updated medication list for this problem includes:    Toprol Xl 100 Mg Xr24h-tab (Metoprolol succinate) ..... One by mouth once daily  BP today: 124/80 Prior BP: 112/70 (05/04/2010)  Prior 10 Yr Risk Heart Disease: Not enough information (02/22/2009)  Labs Reviewed: K+: 4.0 (02/22/2009) Creat: : 0.8 (02/22/2009)     Complete Medication List: 1)  Toprol Xl 100 Mg Xr24h-tab (Metoprolol succinate) .... One by mouth once daily 2)  Alprazolam 0.5 Mg Tabs (Alprazolam) .... Take 1 tab by mouth at bedtime 3)  Trazodone Hcl 50 Mg Tabs (Trazodone hcl) .... 2-4 tabs once daily 4)  Adderall 20 Mg Tabs (Amphetamine-dextroamphetamine) .... Take 2 tablet by mouth two times a day 5)  Hydrocodone-acetaminophen 10-650 Mg Tabs (Hydrocodone-acetaminophen) .... One by mouth three times a day as needed for pain 6)  Lexapro 20 Mg Tabs (Escitalopram oxalate) .... One by mouth once daily 7)  Amrix 15 Mg Xr24h-cap (Cyclobenzaprine hcl) .... One by mouth once daily as needed for neck pain  Patient Instructions: 1)  Please schedule a follow-up appointment in 4 months. Prescriptions: AMRIX 15 MG XR24H-CAP (CYCLOBENZAPRINE HCL) One by mouth once daily as needed for neck pain  #30 x 11   Entered and Authorized by:  Etta Grandchild MD   Signed by:   Etta Grandchild MD on 05/18/2010   Method used:   Print then Give to Patient   RxID:   [id number]

## 2010-11-13 NOTE — Assessment & Plan Note (Signed)
Summary: 2 mos f/u // # / cd   Vital Signs:  Patient profile:   45 year old male Height:      70 inches Weight:      198 pounds BMI:     28.51 O2 Sat:      98 % on Room air Temp:     98.1 degrees F oral Pulse rate:   66 / minute Pulse rhythm:   regular Resp:     16 per minute BP sitting:   140 / 84  (left arm) Cuff size:   large  Vitals Entered By: Rock Nephew CMA (Feb 19, 2010 4:03 PM)  Nutrition Counseling: Patient's BMI is greater than 25 and therefore counseled on weight management options.  O2 Flow:  Room air CC: follow-up visit   Primary Care Provider:  Etta Grandchild MD  CC:  follow-up visit.  History of Present Illness: He returns for f/up and states hat he feels well and has no complaints.  Current Medications (verified): 1)  Toprol Xl 100 Mg Xr24h-Tab (Metoprolol Succinate) .... One By Mouth Once Daily 2)  Alprazolam 0.5 Mg Tabs (Alprazolam) .... Take 1 Tab By Mouth At Bedtime 3)  Lexapro 10 Mg Tabs (Escitalopram Oxalate) .... 3qd 4)  Trazodone Hcl 50 Mg Tabs (Trazodone Hcl) .... 2-4 Tabs Once Daily 5)  Adderall 20 Mg Tabs (Amphetamine-Dextroamphetamine) .... Take 2 Tablet By Mouth Two Times A Day 6)  Hydrocodone-Acetaminophen 10-650 Mg Tabs (Hydrocodone-Acetaminophen) .... One By Mouth Three Times A Day As Needed For Pain  Allergies (verified): 1)  ! Nsaids  Past History:  Past Medical History: Reviewed history from 07/10/2009 and no changes required. Anemia-NOS GERD Gout Hypertension Low back pain- Dr. Mikal Plane Peptic ulcer disease Renal failure- Dr. Kathrene Bongo depression  Past Surgical History: Reviewed history from 02/22/2009 and no changes required. Lumbar fusion Laparotomy-exploratory  Family History: Reviewed history from 02/22/2009 and no changes required. Family History Diabetes 1st degree relative Family History Hypertension  Social History: Reviewed history from 02/22/2009 and no changes required. 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   Heme:  Denies abnormal bruising, bleeding, enlarge lymph nodes, fevers, pallor, and skin discoloration.  Physical Exam  General:  alert, well-developed, well-nourished, and cooperative to examination.   appropriate dress, normal appearance, healthy-appearing, cooperative to examination, and good hygiene.   Head:  normocephalic, atraumatic, and no abnormalities observed.   Mouth:  Oral mucosa and oropharynx without lesions or exudates.  Teeth in good repair. Neck:  supple, full ROM, and no masses.   Lungs:  normal respiratory effort, no intercostal retractions, no accessory muscle use, normal breath sounds, no dullness, and no fremitus.   Heart:  normal rate, regular rhythm, no murmur, no gallop, no rub, and no JVD.   Abdomen:  Bowel sounds positive,abdomen soft and non-tender without masses, organomegaly or hernias noted. Msk:  No deformity or scoliosis noted of thoracic or lumbar spine.   Pulses:  R and L carotid,radial,femoral,dorsalis pedis and posterior tibial pulses are full and equal bilaterally Extremities:  No clubbing, cyanosis, edema, or deformity noted with normal full range of motion of all joints.   Neurologic:  No cranial nerve deficits noted. Station and gait are normal. Plantar reflexes are  down-going bilaterally. DTRs are symmetrical throughout. Sensory, motor and coordinative functions appear intact. Skin:  Intact without suspicious lesions or rashes Cervical Nodes:  no anterior cervical adenopathy and no posterior cervical adenopathy.   Axillary Nodes:  no R axillary  adenopathy and no L axillary adenopathy.   Psych:  Cognition and judgment appear intact. Alert and cooperative with normal attention span and concentration. No apparent delusions, illusions, hallucinations   Impression & Recommendations:  Problem # 1:  HYPERTENSION (ICD-401.9) Assessment Improved  His updated medication list for this problem includes:    Toprol Xl 100 Mg Xr24h-tab (Metoprolol succinate) ..... One by mouth once daily  BP today: 140/84 Prior BP: 108/72 (12/18/2009)  Prior 10 Yr Risk Heart Disease: Not enough information (02/22/2009)  Labs Reviewed: K+: 4.0 (02/22/2009) Creat: : 0.8 (02/22/2009)     Problem # 2:  ANEMIA-NOS (ICD-285.9) Assessment: Improved  Hgb: 11.7 (02/22/2009)   Hct: 34.5 (02/22/2009)   Platelets: 291.0 (02/22/2009) RBC: 3.98 (02/22/2009)   RDW: 14.6 (02/22/2009)   WBC: 6.0 (02/22/2009) MCV: 86.9 (02/22/2009)   MCHC: 33.8 (02/22/2009) Iron: 66 (02/22/2009)   % Sat: 33.1 (02/22/2009) B12: 585 (02/22/2009)   Folate: 14.5 (02/22/2009)   TSH: 0.90 (02/22/2009)  Problem # 3:  DEPRESSIVE DISORDER (ICD-311) Assessment: Improved  The following medications were removed from the medication list:    Lexapro 10 Mg Tabs (Escitalopram oxalate) .Marland Kitchen... 3qd His updated medication list for this problem includes:    Alprazolam 0.5 Mg Tabs (Alprazolam) .Marland Kitchen... Take 1 tab by mouth at bedtime    Trazodone Hcl 50 Mg Tabs (Trazodone hcl) .Marland Kitchen... 2-4 tabs once daily    Lexapro 20 Mg Tabs (Escitalopram oxalate) ..... One by mouth once daily  Complete Medication List: 1)  Toprol Xl 100 Mg Xr24h-tab (Metoprolol succinate) .... One by mouth once daily 2)  Alprazolam 0.5 Mg Tabs (Alprazolam) .... Take 1 tab by mouth at bedtime 3)  Trazodone Hcl 50 Mg Tabs (Trazodone hcl) .... 2-4 tabs once daily 4)  Adderall 20 Mg Tabs (Amphetamine-dextroamphetamine) .... Take 2 tablet by mouth two times a day 5)  Hydrocodone-acetaminophen 10-650 Mg Tabs (Hydrocodone-acetaminophen) ....  One by mouth three times a day as needed for pain 6)  Lexapro 20 Mg Tabs (Escitalopram oxalate) .... One by mouth once daily  Patient Instructions: 1)  Please schedule a follow-up appointment in 2 months. 2)  It is important that you exercise regularly at least 20 minutes 5 times a week. If you develop chest pain, have severe difficulty breathing, or feel very tired , stop exercising immediately and seek medical attention. 3)  You need to lose weight. Consider a lower calorie diet and regular exercise.  4)  Check your Blood Pressure regularly. If it is above 140/90: you should make an appointment.

## 2010-11-13 NOTE — Assessment & Plan Note (Signed)
Summary: DISCUSS ISSUES/NWS #   Vital Signs:  Patient profile:   45 year old male Height:      70 inches Weight:      204 pounds BMI:     29.38 O2 Sat:      97 % on Room air Temp:     98.4 degrees F oral Pulse rate:   66 / minute Pulse rhythm:   regular Resp:     16 per minute BP sitting:   108 / 72  (left arm) Cuff size:   large  Vitals Entered By: Rock Nephew CMA (December 18, 2009 4:04 PM)  O2 Flow:  Room air  Primary Care Provider:  Etta Grandchild MD   History of Present Illness: He returns and reports that he has had some episodes of BP down to 105/60 and fatigue and he wants to adjust his meds.  Also, his pain doc. has changed him to Voltaren Gel and hydrocodone for LBP.  Hypertension History:      He complains of side effects from treatment, but denies headache, chest pain, palpitations, dyspnea with exertion, orthopnea, peripheral edema, and syncope.  He notes the following problems with antihypertensive medication side effects: fatigue.        Positive major cardiovascular risk factors include diabetes and hypertension.  Negative major cardiovascular risk factors include male age less than 23 years old, no history of hyperlipidemia, negative family history for ischemic heart disease, and non-tobacco-user status.        Positive history for target organ damage include renal insufficiency.  Further assessment for target organ damage reveals no history of ASHD, cardiac end-organ damage (CHF/LVH), stroke/TIA, peripheral vascular disease, or hypertensive retinopathy.     Medications Prior to Update: 1)  Norvasc 5 Mg Tabs (Amlodipine Besylate) .... Take 1 Tablet By Mouth Once A Day 2)  Toprol Xl 100 Mg Xr24h-Tab (Metoprolol Succinate) .... 2 Tabs Qd 3)  Colchicine 0.6 Mg Tabs (Colchicine) .... One By Mouth Two Times A Day For Gout 4)  Alprazolam 0.5 Mg Tabs (Alprazolam) .... Take 1 Tab By Mouth At Bedtime 5)  Oxycodone Hcl 10 Mg Tabs (Oxycodone Hcl) .... Take 1 Q 4 Hours  Prn 6)  Lexapro 10 Mg Tabs (Escitalopram Oxalate) .Marland Kitchen.. 1 By Mouth Once Daily 7)  Trazodone Hcl 100 Mg Tabs (Trazodone Hcl) .... One By Mouth Qhs 8)  Adderall Xr 10 Mg Xr24h-Cap (Amphetamine-Dextroamphetamine) .Marland Kitchen.. 1 Two Times A Day  Current Medications (verified): 1)  Toprol Xl 100 Mg Xr24h-Tab (Metoprolol Succinate) .... One By Mouth Once Daily 2)  Alprazolam 0.5 Mg Tabs (Alprazolam) .... Take 1 Tab By Mouth At Bedtime 3)  Lexapro 10 Mg Tabs (Escitalopram Oxalate) .... 3qd 4)  Trazodone Hcl 50 Mg Tabs (Trazodone Hcl) .... 2-4 Tabs Once Daily 5)  Adderall 20 Mg Tabs (Amphetamine-Dextroamphetamine) .... Take 2 Tablet By Mouth Two Times A Day  Allergies (verified): 1)  ! Nsaids  Past History:  Past Medical History: Reviewed history from 07/10/2009 and no changes required. Anemia-NOS GERD Gout Hypertension Low back pain- Dr. Mikal Plane Peptic ulcer disease Renal failure- Dr. Kathrene Bongo depression  Past Surgical History: Reviewed history from 02/22/2009 and no changes required. Lumbar fusion Laparotomy-exploratory  Family History: Reviewed history from 02/22/2009 and no changes required. Family History Diabetes 1st degree relative Family History Hypertension  Social History: Reviewed history from 02/22/2009 and no changes required. 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   GU:  Denies dysuria, hematuria, incontinence, nocturia, urinary frequency, and urinary hesitancy. MS:  Denies joint pain, joint redness, joint swelling, loss of strength, low back pain, muscle aches, muscle, cramps, and muscle weakness. Heme:  Denies abnormal bruising, bleeding, enlarge  lymph nodes, fevers, pallor, and skin discoloration.  Physical Exam  General:  alert, well-developed, well-nourished, and cooperative to examination.   wife here Head:  normocephalic and atraumatic.   Mouth:  Oral mucosa and oropharynx without lesions or exudates.  Teeth in good repair. Neck:  supple, full ROM, no masses, no carotid bruits, and no neck tenderness.   Lungs:  Normal respiratory effort, chest expands symmetrically. Lungs are clear to auscultation, no crackles or wheezes. Heart:  Normal rate and regular rhythm. S1 and S2 normal without gallop, murmur, click, rub or other extra sounds. Abdomen:  Bowel sounds positive,abdomen soft and non-tender without masses, organomegaly or hernias noted. Msk:  No deformity or scoliosis noted of thoracic or lumbar spine.   Pulses:  R and L carotid,radial,femoral,dorsalis pedis and posterior tibial pulses are full and equal bilaterally Extremities:  No clubbing, cyanosis, edema, or deformity noted with normal full range of motion of all joints.   Neurologic:  No cranial nerve deficits noted. Station and gait are normal. Plantar reflexes are down-going bilaterally. DTRs are symmetrical throughout. Sensory, motor and coordinative functions appear intact.   Impression & Recommendations:  Problem # 1:  LOW BACK PAIN (ICD-724.2) Assessment Improved  The following medications were removed from the medication list:    Oxycodone Hcl 10 Mg Tabs (Oxycodone hcl) .Marland Kitchen... Take 1 q 4 hours prn  Problem # 2:  HYPERTENSION (ICD-401.9) Assessment: Improved  The following medications were removed from the medication list:    Norvasc 5 Mg Tabs (Amlodipine besylate) .Marland Kitchen... Take 1 tablet by mouth once a day His updated medication list for this problem includes:    Toprol Xl 100 Mg Xr24h-tab (Metoprolol succinate) ..... One by mouth once daily  BP today: 108/72 Prior BP: 130/92 (07/10/2009)  Prior 10 Yr Risk Heart Disease: Not enough information  (02/22/2009)  Labs Reviewed: K+: 4.0 (02/22/2009) Creat: : 0.8 (02/22/2009)     Problem # 3:  ANEMIA-NOS (ICD-285.9) Assessment: Improved  Problem # 4:  GOUT (ICD-274.9) Assessment: Improved  The following medications were removed from the medication list:    Colchicine 0.6 Mg Tabs (Colchicine) ..... One by mouth two times a day for gout  Complete Medication List: 1)  Toprol Xl 100 Mg Xr24h-tab (Metoprolol succinate) .... One by mouth once daily 2)  Alprazolam 0.5 Mg Tabs (Alprazolam) .... Take 1 tab by mouth at bedtime 3)  Lexapro 10 Mg Tabs (Escitalopram oxalate) .... 3qd 4)  Trazodone Hcl 50 Mg Tabs (Trazodone hcl) .... 2-4 tabs once daily 5)  Adderall 20 Mg Tabs (Amphetamine-dextroamphetamine) .... Take 2 tablet by mouth two times a day  Hypertension Assessment/Plan:      The patient's hypertensive risk group is category C: Target organ damage and/or diabetes.  Today's blood pressure is 108/72.  His blood pressure goal is < 130/80.  Patient Instructions: 1)  Please schedule a follow-up appointment in 2 months. 2)  Check your Blood Pressure regularly. If it is above 140/90: you should make an appointment.

## 2010-11-13 NOTE — Assessment & Plan Note (Signed)
Summary: 4 mo rov /nws  #   Vital Signs:  Patient profile:   45 year old male Height:      70 inches Weight:      196 pounds BMI:     28.22 O2 Sat:      97 % on Room air Temp:     97.7 degrees F oral Pulse rate:   66 / minute Pulse rhythm:   regular Resp:     16 per minute BP sitting:   102 / 72  (left arm) Cuff size:   large  Vitals Entered By: Rock Nephew CMA (September 19, 2010 3:38 PM)  Nutrition Counseling: Patient's BMI is greater than 25 and therefore counseled on weight management options.  O2 Flow:  Room air  Primary Care Sary Bogie:  Etta Grandchild MD   History of Present Illness: He returns and says that he feels well for the most but complains of intermittent ED.  Hypertension History:      He complains of side effects from treatment, but denies headache, chest pain, palpitations, dyspnea with exertion, orthopnea, PND, peripheral edema, visual symptoms, neurologic problems, and syncope.  He notes the following problems with antihypertensive medication side effects: ED.        Positive major cardiovascular risk factors include diabetes and hypertension.  Negative major cardiovascular risk factors include male age less than 30 years old, no history of hyperlipidemia, negative family history for ischemic heart disease, and non-tobacco-user status.        Positive history for target organ damage include renal insufficiency.  Further assessment for target organ damage reveals no history of ASHD, cardiac end-organ damage (CHF/LVH), stroke/TIA, peripheral vascular disease, or hypertensive retinopathy.     Preventive Screening-Counseling & Management  Alcohol-Tobacco     Alcohol drinks/day: 0     Alcohol Counseling: not indicated; patient does not drink     Smoking Status: never     Tobacco Counseling: not indicated; no tobacco use  Hep-HIV-STD-Contraception     Hepatitis Risk: no risk noted     HIV Risk: no risk noted     STD Risk: no risk noted      Sexual  History:  currently monogamous.        Drug Use:  no.        Blood Transfusions:  yes.    Medications Prior to Update: 1)  Toprol Xl 100 Mg Xr24h-Tab (Metoprolol Succinate) .... One By Mouth Once Daily 2)  Alprazolam 0.5 Mg Tabs (Alprazolam) .... Take 1 Tab By Mouth At Bedtime 3)  Trazodone Hcl 50 Mg Tabs (Trazodone Hcl) .... 2-4 Tabs Once Daily 4)  Adderall 20 Mg Tabs (Amphetamine-Dextroamphetamine) .... Take 2 Tablet By Mouth Two Times A Day 5)  Hydrocodone-Acetaminophen 10-650 Mg Tabs (Hydrocodone-Acetaminophen) .... One By Mouth Three Times A Day As Needed For Pain 6)  Lexapro 20 Mg Tabs (Escitalopram Oxalate) .... One By Mouth Once Daily 7)  Robaxin-750 750 Mg Tabs (Methocarbamol) .... One By Mouth By Mouth Three Times A Day For Muscle Spasms  Current Medications (verified): 1)  Toprol Xl 100 Mg Xr24h-Tab (Metoprolol Succinate) .... One By Mouth Once Daily 2)  Alprazolam 0.5 Mg Tabs (Alprazolam) .... Take 1 Tab By Mouth At Bedtime 3)  Trazodone Hcl 50 Mg Tabs (Trazodone Hcl) .... 2-4 Tabs Once Daily 4)  Adderall 20 Mg Tabs (Amphetamine-Dextroamphetamine) .... Take 2 Tablet By Mouth Two Times A Day 5)  Hydrocodone-Acetaminophen 10-650 Mg Tabs (Hydrocodone-Acetaminophen) .... One By Mouth Three  Times A Day As Needed For Pain 6)  Lexapro 20 Mg Tabs (Escitalopram Oxalate) .... One By Mouth Once Daily 7)  Robaxin-750 750 Mg Tabs (Methocarbamol) .... One By Mouth By Mouth Three Times A Day For Muscle Spasms 8)  Cialis 5 Mg Tabs (Tadalafil) .... Take One By Mouth Once Daily As Directed  Allergies (verified): 1)  ! Nsaids  Past History:  Past Medical History: Last updated: 07/10/2009 Anemia-NOS GERD Gout Hypertension Low back pain- Dr. Mikal Plane Peptic ulcer disease Renal failure- Dr. Kathrene Bongo depression  Past Surgical History: Last updated: 02/22/2009 Lumbar fusion Laparotomy-exploratory  Family History: Last updated: 02/22/2009 Family History Diabetes 1st degree  relative Family History Hypertension  Social History: Last updated: 02/22/2009 Occupation: Chartered certified accountant Married Never Smoked Alcohol use-no Drug use-no Regular exercise-no  Risk Factors: Alcohol Use: 0 (09/19/2010) Exercise: no (02/22/2009)  Risk Factors: Smoking Status: never (09/19/2010)  Family History: Reviewed history from 02/22/2009 and no changes required. Family History Diabetes 1st degree relative Family History Hypertension  Social History: Reviewed history from 02/22/2009 and no changes required. Occupation: Chartered certified accountant Married Never Smoked Alcohol use-no Drug use-no Regular exercise-no  Review of Systems  The patient denies anorexia, fever, weight loss, weight gain, chest pain, syncope, peripheral edema, prolonged cough, headaches, hemoptysis, abdominal pain, hematuria, and depression.   GU:  Complains of erectile dysfunction; denies decreased libido, discharge, dysuria, hematuria, incontinence, nocturia, urinary frequency, and urinary hesitancy.  Physical Exam  General:  Well-developed, well-nourished, in no acute distress; alert and oriented x 3.   Mouth:  Oral mucosa and oropharynx without lesions or exudates.  Teeth in good repair. Neck:  supple, full ROM, and no masses.   Lungs:  normal respiratory effort, no intercostal retractions, no accessory muscle use, normal breath sounds, no dullness, and no fremitus.   Heart:  normal rate, regular rhythm, no murmur, no gallop, no rub, and no JVD.   Abdomen:  Bowel sounds positive,abdomen soft and non-tender without masses, organomegaly or hernias noted. Msk:  normal ROM, no joint deformities, no joint instability, and no crepitation.   Pulses:  R and L carotid,radial,femoral,dorsalis pedis and posterior tibial pulses are full and equal bilaterally Extremities:  there is no erythema or warmth on the dorsum of the left great toe but the skin has a small area that has scaled and there is no induration, fluctuance,  paronychia, or exudate. There is no proximal streaking. The nail bed and nail plate are intact with no pus. Neurologic:  No cranial nerve deficits noted. Station and gait are normal. Plantar reflexes are down-going bilaterally. DTRs are symmetrical throughout. Sensory, motor and coordinative functions appear intact. Skin:  no rashes, no suspicious lesions, no ecchymoses, no petechiae, no purpura, no ulcerations, and no edema.   Cervical Nodes:  no anterior cervical adenopathy and no posterior cervical adenopathy.   Psych:  Cognition and judgment appear intact. Alert and cooperative with normal attention span and concentration. No apparent delusions, illusions, hallucinations   Impression & Recommendations:  Problem # 1:  ERECTILE DYSFUNCTION (ICD-302.72) Assessment New  His updated medication list for this problem includes:    Cialis 5 Mg Tabs (Tadalafil) .Marland Kitchen... Take one by mouth once daily as directed  Discussed proper use of medications, as well as side effects.   Problem # 2:  HYPERTENSION (ICD-401.9) Assessment: Improved  His updated medication list for this problem includes:    Toprol Xl 100 Mg Xr24h-tab (Metoprolol succinate) ..... One by mouth once daily  BP today: 102/72 Prior BP: 124/80 (05/18/2010)  Prior 10 Yr Risk Heart Disease: Not enough information (02/22/2009)  Labs Reviewed: K+: 4.0 (02/22/2009) Creat: : 0.8 (02/22/2009)     Complete Medication List: 1)  Toprol Xl 100 Mg Xr24h-tab (Metoprolol succinate) .... One by mouth once daily 2)  Alprazolam 0.5 Mg Tabs (Alprazolam) .... Take 1 tab by mouth at bedtime 3)  Trazodone Hcl 50 Mg Tabs (Trazodone hcl) .... 2-4 tabs once daily 4)  Adderall 20 Mg Tabs (Amphetamine-dextroamphetamine) .... Take 2 tablet by mouth two times a day 5)  Hydrocodone-acetaminophen 10-650 Mg Tabs (Hydrocodone-acetaminophen) .... One by mouth three times a day as needed for pain 6)  Lexapro 20 Mg Tabs (Escitalopram oxalate) .... One by mouth  once daily 7)  Robaxin-750 750 Mg Tabs (Methocarbamol) .... One by mouth by mouth three times a day for muscle spasms 8)  Cialis 5 Mg Tabs (Tadalafil) .... Take one by mouth once daily as directed  Hypertension Assessment/Plan:      The patient's hypertensive risk group is category C: Target organ damage and/or diabetes.  Today's blood pressure is 102/72.  His blood pressure goal is < 130/80.  Patient Instructions: 1)  Please schedule a follow-up appointment in 4 months. 2)  It is important that you exercise regularly at least 20 minutes 5 times a week. If you develop chest pain, have severe difficulty breathing, or feel very tired , stop exercising immediately and seek medical attention. 3)  You need to lose weight. Consider a lower calorie diet and regular exercise.  Prescriptions: CIALIS 5 MG TABS (TADALAFIL) take one by mouth once daily as directed  #30 x 11   Entered and Authorized by:   Etta Grandchild MD   Signed by:   Etta Grandchild MD on 09/19/2010   Method used:   Print then Give to Patient   RxID:   [id number]    Orders Added: 1)  Est. Patient Level IV [43329]

## 2010-11-13 NOTE — Progress Notes (Signed)
Summary: Rf Alprazolam/Jones Pt  Phone Note Refill Request Message from:  Fax from Pharmacy  Refills Requested: Medication #1:  ALPRAZOLAM 0.5 MG TABS Take 1 tab by mouth at bedtime   Dosage confirmed as above?Dosage Confirmed   Supply Requested: 1 month   Last Refilled: 03/22/2010   Notes: 5 RF  Method Requested: Telephone to Pharmacy Next Appointment Scheduled: 05-23-10 Initial call taken by: Lanier Prude, [facility]),  April 26, 2010 8:37 AM    New/Updated Medications: ALPRAZOLAM 0.5 MG TABS (ALPRAZOLAM) Take 1 tab by mouth at bedtime Prescriptions: ALPRAZOLAM 0.5 MG TABS (ALPRAZOLAM) Take 1 tab by mouth at bedtime  #30 x 2   Entered and Authorized by:   Corwin Levins MD   Signed by:   Corwin Levins MD on 04/26/2010   Method used:   Print then Give to Patient   RxID:   [id number]   done hardcopy to LIM side B - dahlia Corwin Levins MD  April 26, 2010 9:06 AM   [facility] Margaret Pyle, [place]  April 26, 2010 9:17 AM

## 2010-11-13 NOTE — Progress Notes (Signed)
Summary: REFILL   Phone Note Call from Patient Call back at Home Phone [phone] Call back at [phone]   Summary of Call: Pt is req refill of hydrocodone 10/650 1 three times a day.  Initial call taken by: Lamar Sprinkles, CMA,  January 08, 2010 4:06 PM  Follow-up for Phone Call        his pain doctor should be prescribing this Follow-up by: Etta Grandchild MD,  January 08, 2010 4:46 PM  Additional Follow-up for Phone Call Additional follow up Details #1::        Per pt, at last office visit he discussed that he no longer was seeing his pain dr and Dr Yetta Barre was prescribing.  Additional Follow-up by: Lamar Sprinkles, CMA,  January 08, 2010 5:51 PM    Additional Follow-up for Phone Call Additional follow up Details #2::    Left mess on cell # to check w/pharm, no answer on hm #, [facility]  Follow-up by: Lamar Sprinkles, CMA,  January 09, 2010 5:33 PM  New/Updated Medications: HYDROCODONE-ACETAMINOPHEN 10-650 MG TABS (HYDROCODONE-ACETAMINOPHEN) One by mouth three times a day as needed for pain Prescriptions: HYDROCODONE-ACETAMINOPHEN 10-650 MG TABS (HYDROCODONE-ACETAMINOPHEN) One by mouth three times a day as needed for pain  #90 x 2   Entered and Authorized by:   Etta Grandchild MD   Signed by:   Etta Grandchild MD on 01/09/2010   Method used:   Historical   RxID:   [id number]

## 2010-11-13 NOTE — Progress Notes (Signed)
Summary: refill  Phone Note Refill Request Message from:  Fax from Pharmacy  Refills Requested: Medication #1:  AMRIX 15 MG XR24H-CAP One by mouth once daily as needed for neck pain.   Last Refilled: 05/18/2010 [facility] [address] that cyclobenzaprine is on backorder. Would you please advise an alternative?  Initial call taken by: Alysia Penna,  August 01, 2010 2:02 PM    New/Updated Medications: ROBAXIN-750 750 MG TABS (METHOCARBAMOL) One by mouth by mouth three times a day for muscle spasms Prescriptions: ROBAXIN-750 750 MG TABS (METHOCARBAMOL) One by mouth by mouth three times a day for muscle spasms  #75 x 11   Entered and Authorized by:   Etta Grandchild MD   Signed by:   Etta Grandchild MD on 08/01/2010   Method used:   [facility]ronically [facility]        [facility]  [address] [phone][facility] (retail)       [address]       [place], [place]  [address]       Ph: [phone]       Fax: [phone]   RxID:   [id number]

## 2010-11-13 NOTE — Progress Notes (Signed)
Summary: xanax refill request  Phone Note Refill Request Message from:  Pharmacy on August 29, 2010 8:15 AM  Refills Requested: Medication #1:  ALPRAZOLAM 0.5 MG TABS Take 1 tab by mouth at bedtime   Dosage confirmed as above?Dosage Confirmed   Supply Requested: 1 month   Last Refilled: 04/26/2010 Is this ok to refill? [facility]  Initial call taken by: Rock Nephew CMA,  August 29, 2010 8:15 AM  Follow-up for Phone Call        yes Follow-up by: Etta Grandchild MD,  August 29, 2010 8:26 AM    Prescriptions: ALPRAZOLAM 0.5 MG TABS (ALPRAZOLAM) Take 1 tab by mouth at bedtime  #30 x 5   Entered by:   Rock Nephew CMA   Authorized by:   Etta Grandchild MD   Signed by:   Rock Nephew CMA on 08/29/2010   Method used:   Telephoned to ...       [facility]  [address] [id number]       [address]       [place], [place]  [address]       Ph: [phone]       Fax: [phone]   RxID:   [id number]

## 2010-11-15 NOTE — Progress Notes (Signed)
Summary: PA - CIALIS  Phone Note From Pharmacy   Caller: PA # [phone] Pt ID# [id number]  Summary of Call: Cialis req PA.  Initial call taken by: Lamar Sprinkles, CMA,  October 05, 2010 11:40 AM  Follow-up for Phone Call        Pharm gave wrong PA #, Correct # [phone].  Follow-up by: Lamar Sprinkles, CMA,  October 12, 2010 11:19 AM  Additional Follow-up for Phone Call Additional follow up Details #1::        Spoke w/ insurance (20 min) it is going for clinical review and they will fax decision.  Additional Follow-up by: Lamar Sprinkles, CMA,  October 12, 2010 11:40 AM    Additional Follow-up for Phone Call Additional follow up Details #2::    PA was denied, Ins may cover levitra or viagra. To have cialis daily covered pt must have failed w/prn useage of alt meds........................Marland KitchenLamar Sprinkles, CMA  October 17, 2010 9:29 AM

## 2010-11-16 ENCOUNTER — Telehealth: Payer: Self-pay | Admitting: Internal Medicine

## 2010-11-21 NOTE — Progress Notes (Signed)
  Phone Note Refill Request Message from:  Fax from Pharmacy on November 16, 2010 9:24 AM  Refills Requested: Medication #1:  HYDROCODONE-ACETAMINOPHEN 10-650 MG TABS One by mouth three times a day as needed for pain   Last Refilled: 07/16/2010 Is this ok to refill?  Initial call taken by: Rock Nephew CMA,  November 16, 2010 9:25 AM  Follow-up for Phone Call        yes Follow-up by: Etta Grandchild MD,  November 16, 2010 9:26 AM    Prescriptions: HYDROCODONE-ACETAMINOPHEN 10-650 MG TABS (HYDROCODONE-ACETAMINOPHEN) One by mouth three times a day as needed for pain  #90 x 3   Entered by:   Rock Nephew CMA   Authorized by:   Etta Grandchild MD   Signed by:   Rock Nephew CMA on 11/16/2010   Method used:   Telephoned to ...       [facility]  [address] [id number]       [address]       [place], [place]  [address]       Ph: [phone]       Fax: [phone]   RxID:   [id number]

## 2011-01-23 ENCOUNTER — Ambulatory Visit (INDEPENDENT_AMBULATORY_CARE_PROVIDER_SITE_OTHER): Payer: Managed Care, Other (non HMO) | Admitting: Internal Medicine

## 2011-01-23 ENCOUNTER — Other Ambulatory Visit (INDEPENDENT_AMBULATORY_CARE_PROVIDER_SITE_OTHER): Payer: Managed Care, Other (non HMO)

## 2011-01-23 ENCOUNTER — Encounter: Payer: Self-pay | Admitting: Internal Medicine

## 2011-01-23 DIAGNOSIS — I10 Essential (primary) hypertension: Secondary | ICD-10-CM

## 2011-01-23 DIAGNOSIS — D649 Anemia, unspecified: Secondary | ICD-10-CM

## 2011-01-23 DIAGNOSIS — M5412 Radiculopathy, cervical region: Secondary | ICD-10-CM

## 2011-01-23 LAB — CBC
HCT: 24 % — ABNORMAL LOW (ref 39.0–52.0)
HCT: 27 % — ABNORMAL LOW (ref 39.0–52.0)
Hemoglobin: 9.2 g/dL — ABNORMAL LOW (ref 13.0–17.0)
MCHC: 34 g/dL (ref 30.0–36.0)
MCHC: 35.5 g/dL (ref 30.0–36.0)
MCV: 86.8 fL (ref 78.0–100.0)
MCV: 87.9 fL (ref 78.0–100.0)
MCV: 88.1 fL (ref 78.0–100.0)
Platelets: 338 10*3/uL (ref 150–400)
Platelets: 479 10*3/uL — ABNORMAL HIGH (ref 150–400)
Platelets: 518 10*3/uL — ABNORMAL HIGH (ref 150–400)
RBC: 2.77 MIL/uL — ABNORMAL LOW (ref 4.22–5.81)
RDW: 14.7 % (ref 11.5–15.5)
RDW: 15.3 % (ref 11.5–15.5)
WBC: 11 10*3/uL — ABNORMAL HIGH (ref 4.0–10.5)

## 2011-01-23 LAB — BASIC METABOLIC PANEL
BUN: 17 mg/dL (ref 6–23)
BUN: 26 mg/dL — ABNORMAL HIGH (ref 6–23)
BUN: 26 mg/dL — ABNORMAL HIGH (ref 6–23)
BUN: 55 mg/dL — ABNORMAL HIGH (ref 6–23)
BUN: 60 mg/dL — ABNORMAL HIGH (ref 6–23)
CO2: 21 mEq/L (ref 19–32)
CO2: 24 mEq/L (ref 19–32)
CO2: 24 mEq/L (ref 19–32)
Calcium: 8.5 mg/dL (ref 8.4–10.5)
Calcium: 8.7 mg/dL (ref 8.4–10.5)
Chloride: 101 mEq/L (ref 96–112)
Chloride: 100 mEq/L (ref 96–112)
Chloride: 96 mEq/L (ref 96–112)
Chloride: 99 mEq/L (ref 96–112)
Creatinine, Ser: 1 mg/dL (ref 0.4–1.5)
Creatinine, Ser: 4.12 mg/dL — ABNORMAL HIGH (ref 0.4–1.5)
Creatinine, Ser: 6.03 mg/dL — ABNORMAL HIGH (ref 0.4–1.5)
Creatinine, Ser: 6.7 mg/dL — ABNORMAL HIGH (ref 0.4–1.5)
GFR calc Af Amer: 12 mL/min — ABNORMAL LOW (ref >60)
GFR calc non Af Amer: 10 mL/min — ABNORMAL LOW (ref >60)
GFR calc non Af Amer: 11 mL/min — ABNORMAL LOW (ref >60)
GFR calc non Af Amer: 31 mL/min — ABNORMAL LOW (ref >60)
GFR: 85.01 mL/min (ref >60.00)
Glucose, Bld: 101 mg/dL — ABNORMAL HIGH (ref 70–99)
Glucose, Bld: 114 mg/dL — ABNORMAL HIGH (ref 70–99)
Glucose, Bld: 133 mg/dL — ABNORMAL HIGH (ref 70–99)
Glucose, Bld: 152 mg/dL — ABNORMAL HIGH (ref 70–99)
Potassium: 3.6 mEq/L (ref 3.5–5.1)
Potassium: 4.7 mEq/L (ref 3.5–5.1)

## 2011-01-23 LAB — RENAL FUNCTION PANEL
Albumin: 1.9 g/dL — ABNORMAL LOW (ref 3.5–5.2)
Albumin: 1.9 g/dL — ABNORMAL LOW (ref 3.5–5.2)
Albumin: 1.9 g/dL — ABNORMAL LOW (ref 3.5–5.2)
Albumin: 2 g/dL — ABNORMAL LOW (ref 3.5–5.2)
BUN: 38 mg/dL — ABNORMAL HIGH (ref 6–23)
BUN: 46 mg/dL — ABNORMAL HIGH (ref 6–23)
BUN: 65 mg/dL — ABNORMAL HIGH (ref 6–23)
CO2: 23 mEq/L (ref 19–32)
CO2: 27 mEq/L (ref 19–32)
Calcium: 8.3 mg/dL — ABNORMAL LOW (ref 8.4–10.5)
Calcium: 8.3 mg/dL — ABNORMAL LOW (ref 8.4–10.5)
Calcium: 8.4 mg/dL (ref 8.4–10.5)
Chloride: 106 mEq/L (ref 96–112)
Chloride: 92 mEq/L — ABNORMAL LOW (ref 96–112)
Creatinine, Ser: 4.68 mg/dL — ABNORMAL HIGH (ref 0.4–1.5)
Creatinine, Ser: 4.82 mg/dL — ABNORMAL HIGH (ref 0.4–1.5)
Creatinine, Ser: 6.84 mg/dL — ABNORMAL HIGH (ref 0.4–1.5)
Creatinine, Ser: 7.5 mg/dL — ABNORMAL HIGH (ref 0.4–1.5)
GFR calc Af Amer: 10 mL/min — ABNORMAL LOW (ref >60)
GFR calc Af Amer: 10 mL/min — ABNORMAL LOW (ref >60)
GFR calc Af Amer: 16 mL/min — ABNORMAL LOW (ref >60)
GFR calc non Af Amer: 13 mL/min — ABNORMAL LOW (ref >60)
GFR calc non Af Amer: 8 mL/min — ABNORMAL LOW (ref >60)
GFR calc non Af Amer: 8 mL/min — ABNORMAL LOW (ref >60)
Glucose, Bld: 100 mg/dL — ABNORMAL HIGH (ref 70–99)
Glucose, Bld: 123 mg/dL — ABNORMAL HIGH (ref 70–99)
Phosphorus: 4.7 mg/dL — ABNORMAL HIGH (ref 2.3–4.6)
Phosphorus: 6.6 mg/dL — ABNORMAL HIGH (ref 2.3–4.6)
Phosphorus: 7.4 mg/dL — ABNORMAL HIGH (ref 2.3–4.6)
Phosphorus: 7.9 mg/dL — ABNORMAL HIGH (ref 2.3–4.6)
Sodium: 133 mEq/L — ABNORMAL LOW (ref 135–145)

## 2011-01-23 LAB — DIFFERENTIAL
Basophils Relative: 0 % (ref 0–1)
Lymphocytes Relative: 17 % (ref 12–46)
Lymphs Abs: 1.3 10*3/uL (ref 0.7–4.0)
Monocytes Absolute: 0.6 10*3/uL (ref 0.1–1.0)
Monocytes Relative: 8 % (ref 3–12)
Neutro Abs: 5.9 10*3/uL (ref 1.7–7.7)
Neutrophils Relative %: 75 % (ref 43–77)

## 2011-01-23 LAB — COMPREHENSIVE METABOLIC PANEL
Albumin: 2.5 g/dL — ABNORMAL LOW (ref 3.5–5.2)
BUN: 22 mg/dL (ref 6–23)
Calcium: 8.9 mg/dL (ref 8.4–10.5)
Creatinine, Ser: 1.71 mg/dL — ABNORMAL HIGH (ref 0.4–1.5)
Glucose, Bld: 139 mg/dL — ABNORMAL HIGH (ref 70–99)
Potassium: 3.7 mEq/L (ref 3.5–5.1)
Total Protein: 6.9 g/dL (ref 6.0–8.3)

## 2011-01-23 LAB — CBC WITH DIFFERENTIAL/PLATELET
Basophils Relative: 0.2 % (ref 0.0–3.0)
Eosinophils Absolute: 0.2 10*3/uL (ref 0.0–0.7)
Eosinophils Relative: 2.3 % (ref 0.0–5.0)
HCT: 46.1 % (ref 39.0–52.0)
Hemoglobin: 16.2 g/dL (ref 13.0–17.0)
MCHC: 35.1 g/dL (ref 30.0–36.0)
MCV: 94.6 fl (ref 78.0–100.0)
Monocytes Absolute: 0.7 10*3/uL (ref 0.1–1.0)
Neutro Abs: 3.7 10*3/uL (ref 1.4–7.7)
RBC: 4.87 Mil/uL (ref 4.22–5.81)

## 2011-01-23 LAB — IBC PANEL: Iron: 61 ug/dL (ref 42–165)

## 2011-01-23 LAB — PROTIME-INR
INR: 1.3 (ref 0.00–1.49)
Prothrombin Time: 16.6 seconds — ABNORMAL HIGH (ref 11.6–15.2)

## 2011-01-23 MED ORDER — MENS MULTIVITAMIN PLUS PO TABS: 1.0000 | ORAL_TABLET | Freq: Every day | ORAL | Status: DC

## 2011-01-23 NOTE — Assessment & Plan Note (Signed)
His BP is well controlled, I will check his renal function and lytes today

## 2011-01-23 NOTE — Assessment & Plan Note (Signed)
He has no s/s of blood loss, I will check his CBC today

## 2011-01-23 NOTE — Progress Notes (Signed)
Subjective:    Patient ID: [name], male    DOB: August 11, 1966, 45 y.o.   MRN: [id number]  Hypertension This is a chronic problem. The current episode started more than 1 year ago. The problem has been rapidly improving since onset. The problem is controlled. Pertinent negatives include no anxiety, blurred vision, chest pain, headaches, malaise/fatigue, neck pain, orthopnea, palpitations, peripheral edema, PND, shortness of breath or sweats. There are no associated agents to hypertension. Risk factors for coronary artery disease include no known risk factors. Past treatments include beta blockers. The current treatment provides significant improvement. There are no compliance problems.       Review of Systems  Constitutional: Negative for fever, chills, malaise/fatigue, diaphoresis, activity change, appetite change, fatigue and unexpected weight change.  HENT: Negative for ear pain, congestion, sore throat, facial swelling, trouble swallowing, neck pain, neck stiffness, voice change and tinnitus.   Eyes: Negative for blurred vision.  Respiratory: Negative for apnea, cough, choking, chest tightness, shortness of breath, wheezing and stridor.   Cardiovascular: Negative for chest pain, palpitations, orthopnea, leg swelling and PND.  Gastrointestinal: Negative for nausea, vomiting, abdominal pain, diarrhea, constipation, blood in stool, abdominal distention, anal bleeding and rectal pain.  Genitourinary: Negative for dysuria, urgency, frequency, hematuria, flank pain, decreased urine volume and difficulty urinating.  Musculoskeletal: Negative for myalgias, back pain, joint swelling, arthralgias and gait problem.  Skin: Negative for color change, pallor and rash.  Neurological: Negative for dizziness, tremors, seizures, syncope, facial asymmetry, speech difficulty, weakness, light-headedness, numbness and headaches.  Hematological: Negative for adenopathy. Does not bruise/bleed easily.    Psychiatric/Behavioral: Negative for hallucinations, behavioral problems, confusion, self-injury, dysphoric mood, decreased concentration and agitation. The patient is not nervous/anxious and is not hyperactive.    Lab Results  Component Value Date   WBC 6.0 02/22/2009   HGB 11.7* 02/22/2009   HGB TRACE-LYSED 02/22/2009   HCT 34.5* 02/22/2009   PLT 291.0 02/22/2009   ALT 32 02/22/2009   AST 27 02/22/2009   NA 147* 02/22/2009   K 4.0 02/22/2009   CL 110 02/22/2009   CREATININE 0.8 02/22/2009   BUN 4* 02/22/2009   CO2 32 02/22/2009   TSH 0.90 02/22/2009   HGBA1C 5.4 02/22/2009      Objective:   Physical Exam  Constitutional: He is oriented to person, place, and time. He appears well-developed and well-nourished. No distress.  HENT:  Head: Normocephalic and atraumatic.  Right Ear: External ear normal.  Left Ear: External ear normal.  Nose: Nose normal.  Mouth/Throat: Oropharynx is clear and moist. No oropharyngeal exudate.  Eyes: Conjunctivae and EOM are normal. Pupils are equal, round, and reactive to light. Right eye exhibits no discharge. Left eye exhibits no discharge. No scleral icterus.  Neck: Normal range of motion. Neck supple. No JVD present. No thyromegaly present.  Cardiovascular: Normal rate, regular rhythm, normal heart sounds and intact distal pulses.  Exam reveals no gallop and no friction rub.   No murmur heard. Pulmonary/Chest: Effort normal and breath sounds normal. No respiratory distress. He has no wheezes. He has no rales. He exhibits no tenderness.  Abdominal: Soft. Bowel sounds are normal. He exhibits no distension and no mass. There is no tenderness. There is no rebound and no guarding.  Musculoskeletal: Normal range of motion. He exhibits no edema and no tenderness.  Lymphadenopathy:    He has no cervical adenopathy.  Neurological: He is alert and oriented to person, place, and time. He has normal reflexes. No cranial nerve deficit.  Coordination normal.  Skin: Skin is  warm and dry. No rash noted. He is not diaphoretic. No erythema. No pallor.  Psychiatric: He has a normal mood and affect. His behavior is normal. Judgment and thought content normal.          Assessment & Plan:

## 2011-01-23 NOTE — Patient Instructions (Signed)

## 2011-01-23 NOTE — Assessment & Plan Note (Signed)
By his report the pain has improved and he has no s/s of radiculopathy

## 2011-01-24 ENCOUNTER — Encounter: Payer: Self-pay | Admitting: Internal Medicine

## 2011-01-24 LAB — BASIC METABOLIC PANEL
BUN: 108 mg/dL — ABNORMAL HIGH (ref 6–23)
BUN: 11 mg/dL (ref 6–23)
BUN: 16 mg/dL (ref 6–23)
BUN: 68 mg/dL — ABNORMAL HIGH (ref 6–23)
BUN: 88 mg/dL — ABNORMAL HIGH (ref 6–23)
BUN: 91 mg/dL — ABNORMAL HIGH (ref 6–23)
BUN: 98 mg/dL — ABNORMAL HIGH (ref 6–23)
BUN: 10 mg/dL (ref 6–23)
BUN: 12 mg/dL (ref 6–23)
BUN: 12 mg/dL (ref 6–23)
BUN: 12 mg/dL (ref 6–23)
CO2: 19 mEq/L (ref 19–32)
CO2: 21 mEq/L (ref 19–32)
CO2: 22 mEq/L (ref 19–32)
CO2: 22 mEq/L (ref 19–32)
CO2: 24 mEq/L (ref 19–32)
CO2: 26 mEq/L (ref 19–32)
CO2: 27 mEq/L (ref 19–32)
CO2: 26 mEq/L (ref 19–32)
CO2: 27 mEq/L (ref 19–32)
CO2: 27 mEq/L (ref 19–32)
Calcium: 7.7 mg/dL — ABNORMAL LOW (ref 8.4–10.5)
Calcium: 7.9 mg/dL — ABNORMAL LOW (ref 8.4–10.5)
Calcium: 8.1 mg/dL — ABNORMAL LOW (ref 8.4–10.5)
Calcium: 8.1 mg/dL — ABNORMAL LOW (ref 8.4–10.5)
Calcium: 8.6 mg/dL (ref 8.4–10.5)
Calcium: 7.2 mg/dL — ABNORMAL LOW (ref 8.4–10.5)
Calcium: 7.6 mg/dL — ABNORMAL LOW (ref 8.4–10.5)
Chloride: 102 mEq/L (ref 96–112)
Chloride: 105 mEq/L (ref 96–112)
Chloride: 105 mEq/L (ref 96–112)
Chloride: 107 mEq/L (ref 96–112)
Chloride: 107 mEq/L (ref 96–112)
Chloride: 108 mEq/L (ref 96–112)
Chloride: 110 mEq/L (ref 96–112)
Chloride: 104 mEq/L (ref 96–112)
Chloride: 104 mEq/L (ref 96–112)
Chloride: 106 mEq/L (ref 96–112)
Chloride: 106 mEq/L (ref 96–112)
Chloride: 108 mEq/L (ref 96–112)
Creatinine, Ser: 1.28 mg/dL (ref 0.4–1.5)
Creatinine, Ser: 5.87 mg/dL — ABNORMAL HIGH (ref 0.4–1.5)
Creatinine, Ser: 6.83 mg/dL — ABNORMAL HIGH (ref 0.4–1.5)
Creatinine, Ser: 7.64 mg/dL — ABNORMAL HIGH (ref 0.4–1.5)
Creatinine, Ser: 8.27 mg/dL — ABNORMAL HIGH (ref 0.4–1.5)
Creatinine, Ser: 0.93 mg/dL (ref 0.4–1.5)
Creatinine, Ser: 0.94 mg/dL (ref 0.4–1.5)
Creatinine, Ser: 1.26 mg/dL (ref 0.4–1.5)
GFR calc Af Amer: 11 mL/min — ABNORMAL LOW (ref >60)
GFR calc Af Amer: 9 mL/min — ABNORMAL LOW (ref >60)
GFR calc Af Amer: >60 mL/min (ref >60)
GFR calc Af Amer: >60 mL/min (ref >60)
GFR calc non Af Amer: 18 mL/min — ABNORMAL LOW (ref >60)
GFR calc non Af Amer: 27 mL/min — ABNORMAL LOW (ref >60)
GFR calc non Af Amer: 9 mL/min — ABNORMAL LOW (ref >60)
GFR calc non Af Amer: >60 mL/min (ref >60)
GFR calc non Af Amer: >60 mL/min (ref >60)
GFR calc non Af Amer: >60 mL/min (ref >60)
Glucose, Bld: 111 mg/dL — ABNORMAL HIGH (ref 70–99)
Glucose, Bld: 120 mg/dL — ABNORMAL HIGH (ref 70–99)
Glucose, Bld: 125 mg/dL — ABNORMAL HIGH (ref 70–99)
Glucose, Bld: 125 mg/dL — ABNORMAL HIGH (ref 70–99)
Glucose, Bld: 127 mg/dL — ABNORMAL HIGH (ref 70–99)
Glucose, Bld: 161 mg/dL — ABNORMAL HIGH (ref 70–99)
Glucose, Bld: 88 mg/dL (ref 70–99)
Glucose, Bld: 99 mg/dL (ref 70–99)
Glucose, Bld: 119 mg/dL — ABNORMAL HIGH (ref 70–99)
Glucose, Bld: 123 mg/dL — ABNORMAL HIGH (ref 70–99)
Glucose, Bld: 134 mg/dL — ABNORMAL HIGH (ref 70–99)
Potassium: 3.7 mEq/L (ref 3.5–5.1)
Potassium: 3.7 mEq/L (ref 3.5–5.1)
Potassium: 3.7 mEq/L (ref 3.5–5.1)
Potassium: 3.8 mEq/L (ref 3.5–5.1)
Potassium: 4.1 mEq/L (ref 3.5–5.1)
Potassium: 3.3 mEq/L — ABNORMAL LOW (ref 3.5–5.1)
Potassium: 3.5 mEq/L (ref 3.5–5.1)
Potassium: 3.7 mEq/L (ref 3.5–5.1)
Potassium: 3.9 mEq/L (ref 3.5–5.1)
Potassium: 4.3 mEq/L (ref 3.5–5.1)
Sodium: 140 mEq/L (ref 135–145)
Sodium: 140 mEq/L (ref 135–145)
Sodium: 141 mEq/L (ref 135–145)
Sodium: 141 mEq/L (ref 135–145)
Sodium: 136 mEq/L (ref 135–145)
Sodium: 138 mEq/L (ref 135–145)

## 2011-01-24 LAB — CULTURE, BAL-QUANTITATIVE W GRAM STAIN

## 2011-01-24 LAB — POCT I-STAT 7, (LYTES, BLD GAS, ICA,H+H)
Acid-base deficit: 14 mmol/L — ABNORMAL HIGH (ref 0.0–2.0)
Calcium, Ion: 0.99 mmol/L — ABNORMAL LOW (ref 1.12–1.32)
HCT: 17 % — ABNORMAL LOW (ref 39.0–52.0)
Hemoglobin: 5.8 g/dL — CL (ref 13.0–17.0)
Hemoglobin: 8.5 g/dL — ABNORMAL LOW (ref 13.0–17.0)
Potassium: 4.1 mEq/L (ref 3.5–5.1)
Potassium: 5.3 mEq/L — ABNORMAL HIGH (ref 3.5–5.1)
Sodium: 138 mEq/L (ref 135–145)
Sodium: 140 mEq/L (ref 135–145)
TCO2: 15 mmol/L (ref 0–100)
pH, Arterial: 7.018 — CL (ref 7.350–7.450)
pH, Arterial: 7.148 — CL (ref 7.350–7.450)

## 2011-01-24 LAB — URINALYSIS, ROUTINE W REFLEX MICROSCOPIC
Glucose, UA: NEGATIVE mg/dL
Glucose, UA: NEGATIVE mg/dL
Glucose, UA: NEGATIVE mg/dL
Ketones, ur: NEGATIVE mg/dL
Ketones, ur: NEGATIVE mg/dL
Ketones, ur: NEGATIVE mg/dL
Leukocytes, UA: NEGATIVE
Protein, ur: 100 mg/dL — AB
Protein, ur: NEGATIVE mg/dL
pH: 5.5 (ref 5.0–8.0)
pH: 5.5 (ref 5.0–8.0)

## 2011-01-24 LAB — COMPREHENSIVE METABOLIC PANEL
ALT: 31 U/L (ref 0–53)
AST: 40 U/L — ABNORMAL HIGH (ref 0–37)
Albumin: 2 g/dL — ABNORMAL LOW (ref 3.5–5.2)
Alkaline Phosphatase: 67 U/L (ref 39–117)
Alkaline Phosphatase: 22 U/L — ABNORMAL LOW (ref 39–117)
BUN: 14 mg/dL (ref 6–23)
BUN: 14 mg/dL (ref 6–23)
CO2: 26 mEq/L (ref 19–32)
Calcium: 8.1 mg/dL — ABNORMAL LOW (ref 8.4–10.5)
Calcium: 6.9 mg/dL — ABNORMAL LOW (ref 8.4–10.5)
Chloride: 104 mEq/L (ref 96–112)
Creatinine, Ser: 1.27 mg/dL (ref 0.4–1.5)
Creatinine, Ser: 1.36 mg/dL (ref 0.4–1.5)
GFR calc Af Amer: >60 mL/min (ref >60)
GFR calc non Af Amer: 14 mL/min — ABNORMAL LOW (ref >60)
Glucose, Bld: 109 mg/dL — ABNORMAL HIGH (ref 70–99)
Glucose, Bld: 165 mg/dL — ABNORMAL HIGH (ref 70–99)
Potassium: 3.9 mEq/L (ref 3.5–5.1)
Sodium: 141 mEq/L (ref 135–145)
Total Bilirubin: 1.2 mg/dL (ref 0.3–1.2)
Total Protein: 3.3 g/dL — ABNORMAL LOW (ref 6.0–8.3)
Total Protein: 3.5 g/dL — ABNORMAL LOW (ref 6.0–8.3)

## 2011-01-24 LAB — CBC
HCT: 19.4 % — ABNORMAL LOW (ref 39.0–52.0)
HCT: 20.6 % — ABNORMAL LOW (ref 39.0–52.0)
HCT: 23 % — ABNORMAL LOW (ref 39.0–52.0)
HCT: 23.5 % — ABNORMAL LOW (ref 39.0–52.0)
HCT: 25.4 % — ABNORMAL LOW (ref 39.0–52.0)
HCT: 27.8 % — ABNORMAL LOW (ref 39.0–52.0)
HCT: 28.1 % — ABNORMAL LOW (ref 39.0–52.0)
HCT: 28.5 % — ABNORMAL LOW (ref 39.0–52.0)
HCT: 28.5 % — ABNORMAL LOW (ref 39.0–52.0)
HCT: 31.4 % — ABNORMAL LOW (ref 39.0–52.0)
HCT: 23.9 % — ABNORMAL LOW (ref 39.0–52.0)
HCT: 26.6 % — ABNORMAL LOW (ref 39.0–52.0)
HCT: 28 % — ABNORMAL LOW (ref 39.0–52.0)
HCT: 32.2 % — ABNORMAL LOW (ref 39.0–52.0)
HCT: 45.8 % (ref 39.0–52.0)
Hemoglobin: 10 g/dL — ABNORMAL LOW (ref 13.0–17.0)
Hemoglobin: 6.4 g/dL — CL (ref 13.0–17.0)
Hemoglobin: 7.3 g/dL — CL (ref 13.0–17.0)
Hemoglobin: 8 g/dL — ABNORMAL LOW (ref 13.0–17.0)
Hemoglobin: 8 g/dL — ABNORMAL LOW (ref 13.0–17.0)
Hemoglobin: 9.1 g/dL — ABNORMAL LOW (ref 13.0–17.0)
Hemoglobin: 9.3 g/dL — ABNORMAL LOW (ref 13.0–17.0)
Hemoglobin: 9.3 g/dL — ABNORMAL LOW (ref 13.0–17.0)
Hemoglobin: 9.6 g/dL — ABNORMAL LOW (ref 13.0–17.0)
Hemoglobin: 9.9 g/dL — ABNORMAL LOW (ref 13.0–17.0)
Hemoglobin: 9.9 g/dL — ABNORMAL LOW (ref 13.0–17.0)
Hemoglobin: 8.5 g/dL — ABNORMAL LOW (ref 13.0–17.0)
Hemoglobin: 8.8 g/dL — ABNORMAL LOW (ref 13.0–17.0)
Hemoglobin: 9.9 g/dL — ABNORMAL LOW (ref 13.0–17.0)
MCHC: 34.2 g/dL (ref 30.0–36.0)
MCHC: 34.4 g/dL (ref 30.0–36.0)
MCHC: 34.5 g/dL (ref 30.0–36.0)
MCHC: 34.5 g/dL (ref 30.0–36.0)
MCHC: 34.6 g/dL (ref 30.0–36.0)
MCHC: 34.7 g/dL (ref 30.0–36.0)
MCHC: 34.8 g/dL (ref 30.0–36.0)
MCHC: 34.8 g/dL (ref 30.0–36.0)
MCHC: 34.9 g/dL (ref 30.0–36.0)
MCHC: 35 g/dL (ref 30.0–36.0)
MCHC: 35.1 g/dL (ref 30.0–36.0)
MCHC: 35.3 g/dL (ref 30.0–36.0)
MCHC: 35.4 g/dL (ref 30.0–36.0)
MCHC: 34.5 g/dL (ref 30.0–36.0)
MCHC: 34.8 g/dL (ref 30.0–36.0)
MCHC: 35.1 g/dL (ref 30.0–36.0)
MCHC: 35.5 g/dL (ref 30.0–36.0)
MCHC: 35.5 g/dL (ref 30.0–36.0)
MCV: 87.5 fL (ref 78.0–100.0)
MCV: 87.7 fL (ref 78.0–100.0)
MCV: 87.8 fL (ref 78.0–100.0)
MCV: 87.9 fL (ref 78.0–100.0)
MCV: 87.9 fL (ref 78.0–100.0)
MCV: 88.4 fL (ref 78.0–100.0)
MCV: 88.6 fL (ref 78.0–100.0)
MCV: 88.9 fL (ref 78.0–100.0)
MCV: 89 fL (ref 78.0–100.0)
MCV: 89 fL (ref 78.0–100.0)
MCV: 89.1 fL (ref 78.0–100.0)
MCV: 89.6 fL (ref 78.0–100.0)
MCV: 87 fL (ref 78.0–100.0)
MCV: 87.7 fL (ref 78.0–100.0)
MCV: 88.1 fL (ref 78.0–100.0)
MCV: 88.2 fL (ref 78.0–100.0)
MCV: 88.5 fL (ref 78.0–100.0)
MCV: 89.1 fL (ref 78.0–100.0)
MCV: 96.4 fL (ref 78.0–100.0)
Platelets: 128 10*3/uL — ABNORMAL LOW (ref 150–400)
Platelets: 239 10*3/uL (ref 150–400)
Platelets: 269 10*3/uL (ref 150–400)
Platelets: 309 10*3/uL (ref 150–400)
Platelets: 360 10*3/uL (ref 150–400)
Platelets: 408 10*3/uL — ABNORMAL HIGH (ref 150–400)
Platelets: 542 10*3/uL — ABNORMAL HIGH (ref 150–400)
Platelets: 580 10*3/uL — ABNORMAL HIGH (ref 150–400)
Platelets: 616 10*3/uL — ABNORMAL HIGH (ref 150–400)
Platelets: 109 10*3/uL — ABNORMAL LOW (ref 150–400)
Platelets: 213 10*3/uL (ref 150–400)
Platelets: 241 10*3/uL (ref 150–400)
Platelets: 69 10*3/uL — ABNORMAL LOW (ref 150–400)
Platelets: 73 10*3/uL — ABNORMAL LOW (ref 150–400)
Platelets: 81 10*3/uL — ABNORMAL LOW (ref 150–400)
Platelets: 90 10*3/uL — ABNORMAL LOW (ref 150–400)
RBC: 2.19 MIL/uL — ABNORMAL LOW (ref 4.22–5.81)
RBC: 2.38 MIL/uL — ABNORMAL LOW (ref 4.22–5.81)
RBC: 2.52 MIL/uL — ABNORMAL LOW (ref 4.22–5.81)
RBC: 2.61 MIL/uL — ABNORMAL LOW (ref 4.22–5.81)
RBC: 2.8 MIL/uL — ABNORMAL LOW (ref 4.22–5.81)
RBC: 3.03 MIL/uL — ABNORMAL LOW (ref 4.22–5.81)
RBC: 3.04 MIL/uL — ABNORMAL LOW (ref 4.22–5.81)
RBC: 3.11 MIL/uL — ABNORMAL LOW (ref 4.22–5.81)
RBC: 3.12 MIL/uL — ABNORMAL LOW (ref 4.22–5.81)
RBC: 3.4 MIL/uL — ABNORMAL LOW (ref 4.22–5.81)
RBC: 3.53 MIL/uL — ABNORMAL LOW (ref 4.22–5.81)
RBC: 2.45 MIL/uL — ABNORMAL LOW (ref 4.22–5.81)
RBC: 2.87 MIL/uL — ABNORMAL LOW (ref 4.22–5.81)
RBC: 4.84 MIL/uL (ref 4.22–5.81)
RDW: 14.9 % (ref 11.5–15.5)
RDW: 14.9 % (ref 11.5–15.5)
RDW: 15.1 % (ref 11.5–15.5)
RDW: 15.1 % (ref 11.5–15.5)
RDW: 15.2 % (ref 11.5–15.5)
RDW: 15.5 % (ref 11.5–15.5)
RDW: 15.5 % (ref 11.5–15.5)
RDW: 15.6 % — ABNORMAL HIGH (ref 11.5–15.5)
RDW: 16 % — ABNORMAL HIGH (ref 11.5–15.5)
RDW: 16.4 % — ABNORMAL HIGH (ref 11.5–15.5)
RDW: 15.5 % (ref 11.5–15.5)
RDW: 15.5 % (ref 11.5–15.5)
RDW: 15.6 % — ABNORMAL HIGH (ref 11.5–15.5)
RDW: 15.7 % — ABNORMAL HIGH (ref 11.5–15.5)
RDW: 16.3 % — ABNORMAL HIGH (ref 11.5–15.5)
WBC: 13.4 10*3/uL — ABNORMAL HIGH (ref 4.0–10.5)
WBC: 13.6 10*3/uL — ABNORMAL HIGH (ref 4.0–10.5)
WBC: 14.8 10*3/uL — ABNORMAL HIGH (ref 4.0–10.5)
WBC: 16.7 10*3/uL — ABNORMAL HIGH (ref 4.0–10.5)
WBC: 18.1 10*3/uL — ABNORMAL HIGH (ref 4.0–10.5)
WBC: 19.1 10*3/uL — ABNORMAL HIGH (ref 4.0–10.5)
WBC: 20 10*3/uL — ABNORMAL HIGH (ref 4.0–10.5)
WBC: 20.5 10*3/uL — ABNORMAL HIGH (ref 4.0–10.5)
WBC: 6.7 10*3/uL (ref 4.0–10.5)
WBC: 9.4 10*3/uL (ref 4.0–10.5)
WBC: 11.9 10*3/uL — ABNORMAL HIGH (ref 4.0–10.5)
WBC: 22.8 10*3/uL — ABNORMAL HIGH (ref 4.0–10.5)
WBC: 4 10*3/uL (ref 4.0–10.5)
WBC: 4.7 10*3/uL (ref 4.0–10.5)
WBC: 5.7 10*3/uL (ref 4.0–10.5)
WBC: 7.4 10*3/uL (ref 4.0–10.5)

## 2011-01-24 LAB — DIFFERENTIAL
Basophils Relative: 0 % (ref 0–1)
Lymphocytes Relative: 6 % — ABNORMAL LOW (ref 12–46)
Lymphocytes Relative: 16 % (ref 12–46)
Lymphs Abs: 1.2 10*3/uL (ref 0.7–4.0)
Monocytes Absolute: 1.5 10*3/uL — ABNORMAL HIGH (ref 0.1–1.0)
Monocytes Relative: 7 % (ref 3–12)
Monocytes Relative: 4 % (ref 3–12)
Neutro Abs: 17.6 10*3/uL — ABNORMAL HIGH (ref 1.7–7.7)
Neutro Abs: 5.9 10*3/uL (ref 1.7–7.7)
Neutrophils Relative %: 80 % — ABNORMAL HIGH (ref 43–77)

## 2011-01-24 LAB — CROSSMATCH
ABO/RH(D): O POS
Antibody Screen: NEGATIVE

## 2011-01-24 LAB — POCT I-STAT 3, ART BLOOD GAS (G3+)
Acid-Base Excess: 1 mmol/L (ref 0.0–2.0)
Acid-Base Excess: 1 mmol/L (ref 0.0–2.0)
Acid-Base Excess: 3 mmol/L — ABNORMAL HIGH (ref 0.0–2.0)
Acid-Base Excess: 3 mmol/L — ABNORMAL HIGH (ref 0.0–2.0)
Acid-base deficit: 1 mmol/L (ref 0.0–2.0)
Acid-base deficit: 10 mmol/L — ABNORMAL HIGH (ref 0.0–2.0)
Acid-base deficit: 4 mmol/L — ABNORMAL HIGH (ref 0.0–2.0)
Bicarbonate: 25.3 mEq/L — ABNORMAL HIGH (ref 20.0–24.0)
Bicarbonate: 27 mEq/L — ABNORMAL HIGH (ref 20.0–24.0)
Bicarbonate: 23 mEq/L (ref 20.0–24.0)
Bicarbonate: 25.1 mEq/L — ABNORMAL HIGH (ref 20.0–24.0)
Bicarbonate: 26.2 mEq/L — ABNORMAL HIGH (ref 20.0–24.0)
Bicarbonate: 27.6 mEq/L — ABNORMAL HIGH (ref 20.0–24.0)
O2 Saturation: 95 %
O2 Saturation: 98 %
O2 Saturation: 94 %
O2 Saturation: 97 %
O2 Saturation: 98 %
O2 Saturation: 98 %
Patient temperature: 100.2
Patient temperature: 100.5
Patient temperature: 101.6
Patient temperature: 102.8
Patient temperature: 97.8
Patient temperature: 98.3
Patient temperature: 98.5
TCO2: 26 mmol/L (ref 0–100)
TCO2: 23 mmol/L (ref 0–100)
TCO2: 24 mmol/L (ref 0–100)
TCO2: 25 mmol/L (ref 0–100)
TCO2: 29 mmol/L (ref 0–100)
pCO2 arterial: 40.4 mmHg (ref 35.0–45.0)
pCO2 arterial: 45 mmHg (ref 35.0–45.0)
pCO2 arterial: 25 mmHg — ABNORMAL LOW (ref 35.0–45.0)
pCO2 arterial: 33.8 mmHg — ABNORMAL LOW (ref 35.0–45.0)
pCO2 arterial: 34.2 mmHg — ABNORMAL LOW (ref 35.0–45.0)
pCO2 arterial: 38.5 mmHg (ref 35.0–45.0)
pH, Arterial: 7.34 — ABNORMAL LOW (ref 7.350–7.450)
pH, Arterial: 7.291 — ABNORMAL LOW (ref 7.350–7.450)
pH, Arterial: 7.398 (ref 7.350–7.450)
pH, Arterial: 7.424 (ref 7.350–7.450)
pH, Arterial: 7.445 (ref 7.350–7.450)
pH, Arterial: 7.516 — ABNORMAL HIGH (ref 7.350–7.450)
pO2, Arterial: 116 mmHg — ABNORMAL HIGH (ref 80.0–100.0)
pO2, Arterial: 118 mmHg — ABNORMAL HIGH (ref 80.0–100.0)
pO2, Arterial: 67 mmHg — ABNORMAL LOW (ref 80.0–100.0)
pO2, Arterial: 82 mmHg (ref 80.0–100.0)
pO2, Arterial: 90 mmHg (ref 80.0–100.0)

## 2011-01-24 LAB — RENAL FUNCTION PANEL
Albumin: 1.7 g/dL — ABNORMAL LOW (ref 3.5–5.2)
Albumin: 1.8 g/dL — ABNORMAL LOW (ref 3.5–5.2)
Albumin: 1.8 g/dL — ABNORMAL LOW (ref 3.5–5.2)
Albumin: 1.9 g/dL — ABNORMAL LOW (ref 3.5–5.2)
Albumin: 1.9 g/dL — ABNORMAL LOW (ref 3.5–5.2)
Albumin: 2 g/dL — ABNORMAL LOW (ref 3.5–5.2)
Albumin: 2 g/dL — ABNORMAL LOW (ref 3.5–5.2)
Albumin: 2 g/dL — ABNORMAL LOW (ref 3.5–5.2)
BUN: 101 mg/dL — ABNORMAL HIGH (ref 6–23)
BUN: 108 mg/dL — ABNORMAL HIGH (ref 6–23)
BUN: 131 mg/dL — ABNORMAL HIGH (ref 6–23)
BUN: 54 mg/dL — ABNORMAL HIGH (ref 6–23)
BUN: 87 mg/dL — ABNORMAL HIGH (ref 6–23)
BUN: 92 mg/dL — ABNORMAL HIGH (ref 6–23)
CO2: 20 mEq/L (ref 19–32)
CO2: 21 mEq/L (ref 19–32)
CO2: 22 mEq/L (ref 19–32)
CO2: 23 mEq/L (ref 19–32)
CO2: 25 mEq/L (ref 19–32)
CO2: 28 mEq/L (ref 19–32)
Calcium: 7.9 mg/dL — ABNORMAL LOW (ref 8.4–10.5)
Calcium: 8.1 mg/dL — ABNORMAL LOW (ref 8.4–10.5)
Calcium: 8.1 mg/dL — ABNORMAL LOW (ref 8.4–10.5)
Calcium: 8.2 mg/dL — ABNORMAL LOW (ref 8.4–10.5)
Calcium: 8.3 mg/dL — ABNORMAL LOW (ref 8.4–10.5)
Calcium: 8.3 mg/dL — ABNORMAL LOW (ref 8.4–10.5)
Calcium: 8.5 mg/dL (ref 8.4–10.5)
Calcium: 8.5 mg/dL (ref 8.4–10.5)
Chloride: 103 mEq/L (ref 96–112)
Chloride: 104 mEq/L (ref 96–112)
Chloride: 109 mEq/L (ref 96–112)
Chloride: 92 mEq/L — ABNORMAL LOW (ref 96–112)
Chloride: 93 mEq/L — ABNORMAL LOW (ref 96–112)
Chloride: 99 mEq/L (ref 96–112)
Creatinine, Ser: 5.39 mg/dL — ABNORMAL HIGH (ref 0.4–1.5)
Creatinine, Ser: 6.59 mg/dL — ABNORMAL HIGH (ref 0.4–1.5)
Creatinine, Ser: 6.85 mg/dL — ABNORMAL HIGH (ref 0.4–1.5)
Creatinine, Ser: 7.55 mg/dL — ABNORMAL HIGH (ref 0.4–1.5)
Creatinine, Ser: 7.69 mg/dL — ABNORMAL HIGH (ref 0.4–1.5)
Creatinine, Ser: 7.83 mg/dL — ABNORMAL HIGH (ref 0.4–1.5)
Creatinine, Ser: 8.32 mg/dL — ABNORMAL HIGH (ref 0.4–1.5)
Creatinine, Ser: 8.79 mg/dL — ABNORMAL HIGH (ref 0.4–1.5)
Creatinine, Ser: 8.93 mg/dL — ABNORMAL HIGH (ref 0.4–1.5)
GFR calc Af Amer: 11 mL/min — ABNORMAL LOW (ref >60)
GFR calc Af Amer: 14 mL/min — ABNORMAL LOW (ref >60)
GFR calc Af Amer: 9 mL/min — ABNORMAL LOW (ref >60)
GFR calc Af Amer: 9 mL/min — ABNORMAL LOW (ref >60)
GFR calc non Af Amer: 12 mL/min — ABNORMAL LOW (ref >60)
GFR calc non Af Amer: 12 mL/min — ABNORMAL LOW (ref >60)
GFR calc non Af Amer: 7 mL/min — ABNORMAL LOW (ref >60)
GFR calc non Af Amer: 8 mL/min — ABNORMAL LOW (ref >60)
GFR calc non Af Amer: 9 mL/min — ABNORMAL LOW (ref >60)
Glucose, Bld: 104 mg/dL — ABNORMAL HIGH (ref 70–99)
Glucose, Bld: 112 mg/dL — ABNORMAL HIGH (ref 70–99)
Glucose, Bld: 121 mg/dL — ABNORMAL HIGH (ref 70–99)
Glucose, Bld: 122 mg/dL — ABNORMAL HIGH (ref 70–99)
Glucose, Bld: 125 mg/dL — ABNORMAL HIGH (ref 70–99)
Glucose, Bld: 131 mg/dL — ABNORMAL HIGH (ref 70–99)
Glucose, Bld: 136 mg/dL — ABNORMAL HIGH (ref 70–99)
Phosphorus: 10 mg/dL — ABNORMAL HIGH (ref 2.3–4.6)
Phosphorus: 11.9 mg/dL — ABNORMAL HIGH (ref 2.3–4.6)
Phosphorus: 7.8 mg/dL — ABNORMAL HIGH (ref 2.3–4.6)
Phosphorus: 8.2 mg/dL — ABNORMAL HIGH (ref 2.3–4.6)
Phosphorus: 9.6 mg/dL — ABNORMAL HIGH (ref 2.3–4.6)
Potassium: 3.9 mEq/L (ref 3.5–5.1)
Potassium: 4.1 mEq/L (ref 3.5–5.1)
Potassium: 4.1 mEq/L (ref 3.5–5.1)
Potassium: 4.2 mEq/L (ref 3.5–5.1)
Sodium: 128 mEq/L — ABNORMAL LOW (ref 135–145)
Sodium: 134 mEq/L — ABNORMAL LOW (ref 135–145)
Sodium: 134 mEq/L — ABNORMAL LOW (ref 135–145)
Sodium: 135 mEq/L (ref 135–145)
Sodium: 136 mEq/L (ref 135–145)

## 2011-01-24 LAB — GLUCOSE, CAPILLARY
Glucose-Capillary: 120 mg/dL — ABNORMAL HIGH (ref 70–99)
Glucose-Capillary: 132 mg/dL — ABNORMAL HIGH (ref 70–99)
Glucose-Capillary: 133 mg/dL — ABNORMAL HIGH (ref 70–99)
Glucose-Capillary: 139 mg/dL — ABNORMAL HIGH (ref 70–99)
Glucose-Capillary: 144 mg/dL — ABNORMAL HIGH (ref 70–99)
Glucose-Capillary: 148 mg/dL — ABNORMAL HIGH (ref 70–99)
Glucose-Capillary: 160 mg/dL — ABNORMAL HIGH (ref 70–99)
Glucose-Capillary: 164 mg/dL — ABNORMAL HIGH (ref 70–99)
Glucose-Capillary: 228 mg/dL — ABNORMAL HIGH (ref 70–99)
Glucose-Capillary: 93 mg/dL (ref 70–99)

## 2011-01-24 LAB — CULTURE, BLOOD (ROUTINE X 2)

## 2011-01-24 LAB — POCT I-STAT, CHEM 8
BUN: 17 mg/dL (ref 6–23)
Hemoglobin: 5.8 g/dL — CL (ref 13.0–17.0)
Sodium: 135 mEq/L (ref 135–145)
TCO2: 15 mmol/L (ref 0–100)

## 2011-01-24 LAB — PREPARE FRESH FROZEN PLASMA

## 2011-01-24 LAB — APTT
aPTT: 31 seconds (ref 24–37)
aPTT: 34 seconds (ref 24–37)
aPTT: 40 seconds — ABNORMAL HIGH (ref 24–37)
aPTT: 42 seconds — ABNORMAL HIGH (ref 24–37)

## 2011-01-24 LAB — MAGNESIUM
Magnesium: 2.6 mg/dL — ABNORMAL HIGH (ref 1.5–2.5)
Magnesium: 2.2 mg/dL (ref 1.5–2.5)

## 2011-01-24 LAB — BRAIN NATRIURETIC PEPTIDE: Pro B Natriuretic peptide (BNP): 129 pg/mL — ABNORMAL HIGH (ref 0.0–100.0)

## 2011-01-24 LAB — CK TOTAL AND CKMB (NOT AT ARMC)
CK, MB: 0.7 ng/mL (ref 0.3–4.0)
CK, MB: 1.5 ng/mL (ref 0.3–4.0)
CK, MB: 3.1 ng/mL (ref 0.3–4.0)
Relative Index: 0.2 (ref 0.0–2.5)
Relative Index: 0.3 (ref 0.0–2.5)
Relative Index: 0.6 (ref 0.0–2.5)
Relative Index: 0.5 (ref 0.0–2.5)
Total CK: 621 U/L — ABNORMAL HIGH (ref 7–232)
Total CK: 1311 U/L — ABNORMAL HIGH (ref 7–232)
Total CK: 576 U/L — ABNORMAL HIGH (ref 7–232)

## 2011-01-24 LAB — VANCOMYCIN, RANDOM
Vancomycin Rm: 19.2 ug/mL
Vancomycin Rm: 24.5 ug/mL

## 2011-01-24 LAB — HEPATIC FUNCTION PANEL
ALT: 68 U/L — ABNORMAL HIGH (ref 0–53)
AST: 65 U/L — ABNORMAL HIGH (ref 0–37)
AST: 71 U/L — ABNORMAL HIGH (ref 0–37)
Alkaline Phosphatase: 82 U/L (ref 39–117)
Bilirubin, Direct: 4.2 mg/dL — ABNORMAL HIGH (ref 0.0–0.3)
Bilirubin, Direct: 4.6 mg/dL — ABNORMAL HIGH (ref 0.0–0.3)
Indirect Bilirubin: 2.6 mg/dL — ABNORMAL HIGH (ref 0.3–0.9)
Indirect Bilirubin: 2.8 mg/dL — ABNORMAL HIGH (ref 0.3–0.9)
Total Bilirubin: 6.8 mg/dL — ABNORMAL HIGH (ref 0.3–1.2)
Total Protein: 6.3 g/dL (ref 6.0–8.3)

## 2011-01-24 LAB — URINE MICROSCOPIC-ADD ON

## 2011-01-24 LAB — RETICULOCYTES
RBC.: 2.3 MIL/uL — ABNORMAL LOW (ref 4.22–5.81)
Retic Count, Absolute: 66.7 10*3/uL (ref 19.0–186.0)
Retic Ct Pct: 2.9 % (ref 0.4–3.1)

## 2011-01-24 LAB — ABO/RH: ABO/RH(D): O POS

## 2011-01-24 LAB — PHOSPHORUS: Phosphorus: 3.6 mg/dL (ref 2.3–4.6)

## 2011-01-24 LAB — CARBOXYHEMOGLOBIN
Carboxyhemoglobin: 1.2 % (ref 0.5–1.5)
Methemoglobin: 0.5 % (ref 0.0–1.5)
Total hemoglobin: 9.8 g/dL — ABNORMAL LOW (ref 13.5–18.0)

## 2011-01-24 LAB — PROTIME-INR
INR: 1.3 (ref 0.00–1.49)
INR: 1.5 (ref 0.00–1.49)
INR: 1.6 — ABNORMAL HIGH (ref 0.00–1.49)
INR: 1.7 — ABNORMAL HIGH (ref 0.00–1.49)
Prothrombin Time: 16.9 seconds — ABNORMAL HIGH (ref 11.6–15.2)
Prothrombin Time: 17.5 seconds — ABNORMAL HIGH (ref 11.6–15.2)
Prothrombin Time: 18.4 seconds — ABNORMAL HIGH (ref 11.6–15.2)
Prothrombin Time: 19.1 seconds — ABNORMAL HIGH (ref 11.6–15.2)
Prothrombin Time: 20.2 seconds — ABNORMAL HIGH (ref 11.6–15.2)
Prothrombin Time: 20.9 seconds — ABNORMAL HIGH (ref 11.6–15.2)
Prothrombin Time: 24.2 seconds — ABNORMAL HIGH (ref 11.6–15.2)

## 2011-01-24 LAB — URINE CULTURE
Colony Count: NO GROWTH
Colony Count: NO GROWTH
Special Requests: NEGATIVE
Special Requests: NEGATIVE

## 2011-01-24 LAB — PREPARE PLATELETS

## 2011-01-24 LAB — CARDIAC PANEL(CRET KIN+CKTOT+MB+TROPI)
CK, MB: 0.9 ng/mL (ref 0.3–4.0)
Relative Index: INVALID (ref 0.0–2.5)
Troponin I: 0.85 ng/mL (ref 0.00–0.06)

## 2011-01-24 LAB — POCT I-STAT 4, (NA,K, GLUC, HGB,HCT)
Glucose, Bld: 194 mg/dL — ABNORMAL HIGH (ref 70–99)
HCT: 30 % — ABNORMAL LOW (ref 39.0–52.0)
Hemoglobin: 10.2 g/dL — ABNORMAL LOW (ref 13.0–17.0)
Sodium: 139 mEq/L (ref 135–145)

## 2011-01-24 LAB — CLOSTRIDIUM DIFFICILE EIA

## 2011-01-24 LAB — TROPONIN I
Troponin I: 0.16 ng/mL — ABNORMAL HIGH (ref 0.00–0.06)
Troponin I: 0.17 ng/mL — ABNORMAL HIGH (ref 0.00–0.06)
Troponin I: 0.54 ng/mL (ref 0.00–0.06)

## 2011-01-24 LAB — TYPE AND SCREEN: ABO/RH(D): O POS

## 2011-01-24 LAB — LACTIC ACID, PLASMA: Lactic Acid, Venous: 4.7 mmol/L — ABNORMAL HIGH (ref 0.5–2.2)

## 2011-01-24 LAB — BUN: BUN: 34 mg/dL — ABNORMAL HIGH (ref 6–23)

## 2011-01-24 LAB — IRON AND TIBC
Iron: 20 ug/dL — ABNORMAL LOW (ref 42–135)
UIBC: 179 ug/dL

## 2011-01-24 LAB — CALCIUM, IONIZED: Calcium, Ion: 1.14 mmol/L (ref 1.12–1.32)

## 2011-01-24 LAB — VITAMIN B12: Vitamin B-12: 374 pg/mL (ref 211–911)

## 2011-01-24 LAB — CREATININE, SERUM
Creatinine, Ser: 3.23 mg/dL — ABNORMAL HIGH (ref 0.4–1.5)
GFR calc Af Amer: 26 mL/min — ABNORMAL LOW (ref >60)

## 2011-01-24 LAB — URIC ACID: Uric Acid, Serum: 11.1 mg/dL — ABNORMAL HIGH (ref 4.0–7.8)

## 2011-02-26 ENCOUNTER — Telehealth: Payer: Self-pay

## 2011-02-26 MED ORDER — ALPRAZOLAM 0.5 MG PO TABS: 0.5000 mg | ORAL_TABLET | Freq: Every evening | ORAL | Status: DC | PRN

## 2011-02-26 NOTE — Op Note (Signed)
[name], [name]              ACCOUNT NO.:  [id number]   MEDICAL RECORD NO.:  [id number]          PATIENT TYPE:  INP   LOCATION:                               FACILITY:  [facility]   PHYSICIAN:  Larina Earthly, M.D.    DATE OF BIRTH:  [birth year]   DATE OF PROCEDURE:  12/31/2008  DATE OF DISCHARGE:                               OPERATIVE REPORT   PREOPERATIVE DIAGNOSIS:  Open abdominal wound.   POSTOPERATIVE DIAGNOSES:  Open abdominal wound with renal insufficiency.   PROCEDURES:  1. Closure of abdominal wound.  2. Placement of right femoral temporary hemodialysis catheter.   SURGEON:  Larina Earthly, MD   ASSISTANT:  Wilmon Arms, PA-C   ANESTHESIA:  General endotracheal.   COMPLICATIONS:  None.   DISPOSITION:  To recovery room, stable.   INDICATIONS FOR PROCEDURE:  The patient is a 45 year old gentleman who  was admitted on December 24, 2008, with a large retroperitoneal hematoma.  He underwent exploratory laparotomy at that time and had swelling and  was unable to close his abdominal wound.  He has had subsequent return  to the operating room for partial closure on several occasions.  He was  taken to the operating room at this time for hopeful complete closure  versus further temporary partial closure.  He also has progressive renal  insufficiency with a creatinine in the 8 range.  I discussed this with  Dr. Delano Metz with Renal service and they requested that we place a  temporary catheter at the same time.  This was discussed with wife the  morning of the procedure who understands as well.   PROCEDURE IN DETAIL:  The patient was taken to the operating room,  placed in supine position where the prior VAC sponge was removed from  the abdomen.  The abdomen was prepped and draped in the usual sterile  fashion.  The fascia was loose enough to be able to be closed without  tension.  The fascia was closed with interrupted figure-of-eight 0  Prolene sutures.  The wound  again was irrigated with saline and the skin  was closed with skin staples.  A KUB was obtained in the operating room  to confirm that there was no evidence of any retained foreign bodies.  This was negative.  Next, the right groin was imaged with ultrasound and  the femoral vein was patent.  The right groin area was prepped and  draped in usual sterile fashion and using 18-gauge needle, the femoral  vein was easily accessed.  Next, using a Seldinger technique, guidewire  was passed more centrally, and the dilator, and then the 20-  cm femoral temporary catheter was placed without difficulty.  Both  lumens were flushed and aspirated easily and were locked with 1000  units/mL heparin.  Catheter was secured at the skin with a 3-0 nylon  stitch.  Sterile dressing was applied.  The patient was transferred back  to the intensive care unit in stable condition.      Larina Earthly, M.D.  Electronically Signed     TFE/MEDQ  D:  12/31/2008  T:  12/31/2008  Job:  [id number]

## 2011-02-26 NOTE — Consult Note (Signed)
NAMEWILMON, [name]              ACCOUNT NO.:  [id number]   MEDICAL RECORD NO.:  [id number]          PATIENT TYPE:  INP   LOCATION:  2304                         FACILITY:  [facility]   PHYSICIAN:  Ollen Gross. Vernell Morgans, M.D. DATE OF BIRTH:  Oct 03, 1966   DATE OF CONSULTATION:  DATE OF DISCHARGE:                                 CONSULTATION   [name] is a 45 year old white male who underwent a lumbar fusion  yesterday.  He appeared to do well and that he was up and ambulatory  this morning and feeling fine.  He was discharged this morning.  Upon  arriving home, he developed hypotension and was brought back to the  emergency department in hypovolemic shock.  CT scan suggested a  retroperitoneal bleed probably secondary to venous injury from his  surgery the day before.  He was taken to the operating room by the  vascular surgeon is Dr. Myra Gianotti for exploration of his abdomen.  We are  here to assist in the exploration and help manage his abdominal wound.  Rest of the findings will be dictated in the operative note.      Ollen Gross. Vernell Morgans, M.D.  Electronically Signed     PST/MEDQ  D:  12/24/2008  T:  12/25/2008  Job:  [id number]

## 2011-02-26 NOTE — Assessment & Plan Note (Signed)
HISTORY OF PRESENT ILLNESS:  [name] is back regarding his lumbar spine  surgery and after his rehab stay.  [name] has made great progress.  He  has returned to work in July.  He complains of some pain in his foot and  low back.  He is using 1 or 2 Vicodin a day for breakthrough symptoms  and this seems to be doing a fair job.  He complains of some ongoing  weakness in the right foot.  Pain is 2-3/10 and described as aching in  general.  Sleep is fair.   REVIEW OF SYSTEMS:  The patient reports occasional sugar fluctuations.  His gout has been under control.  Other pertinent positives are above  and full 14-point review is in the written health and history section of  her chart.  He reports some hair loss as well.   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  He still occasionally drinks.   PHYSICAL EXAMINATION:  VITAL SIGNS:  Blood pressure 125/74, pulse 72,  respiratory rate 18.  He is sating 97% on room air.  GENERAL:  The patient is pleasant, alert, and oriented x3.  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  I was able to walk through it and to make accommodations in his  gait pattern.  He has weakness still with eversion and ankle  dorsiflexion.  Sensation is decreased in the peroneal nerve  distribution.  There is no pain in the ankle or swelling noted today.  Back is stable with postoperative scarring noted in the abdomen and  back.  Cognitively, he is well within normal limits.  HEART:  Regular.  CHEST:  Clear.  ABDOMEN:  Soft, nontender.   ASSESSMENT:  1. History of lumbar spine fusion with secondary complications      including retroperitoneal hematoma and deconditioning.  2. History of gouty arthritis.  3. Likely L5 radiculopathy.   PLAN:  1. Continue gout maintenance with allopurinol and p.r.n. colchicine.  2. Encouraged ankle dorsiflexion and eversion exercises.  He  should      wear supportive shoe wear for his foot as well as the ankle.  3. We discussed this hair loss which is likely stress related.  He may      be having some improvement there.  If it persists, I might suggest      checking testosterone as well as thyroid function tests.  4. I will see him back in 3 months as he continues on hydrocodone for      breakthrough pain.  He needed no refills today.      Ranelle Oyster, M.D.  Electronically Signed     ZTS/MedQ  D:  06/07/2009 15:01:00  T:  06/08/2009 05:18:47  Job #:  [id number]

## 2011-02-26 NOTE — Op Note (Signed)
NAME:  [name], [name]              ACCOUNT NO.:  [id number]   MEDICAL RECORD NO.:  [id number]          PATIENT TYPE:  INP   LOCATION:  3030                         FACILITY:  [facility]   PHYSICIAN:  Reinaldo Meeker, M.D. DATE OF BIRTH:  Feb 26, 1966   DATE OF PROCEDURE:  12/23/2008  DATE OF DISCHARGE:                               OPERATIVE REPORT   PREOPERATIVE DIAGNOSIS:  Foraminal stenosis L5-S1, left, secondary to a  spur off the superior facet of S1.   POSTOPERATIVE DIAGNOSIS:  Foraminal stenosis L5-S1, left, secondary to a  spur off the superior facet of S1.   PROCEDURE:  Left L5-S1 decompressive laminotomy with decompression of L5  and S1 nerve roots more so than needed for transforaminal lumbar  interbody fusion, followed by left L5-S1 diskectomy, followed by left L5-  S1 transforaminal lumbar interbody fusion with PEEK interbody cage,  followed by nonsegmental instrumentation L5-S1 with right transfacet  screw and spinous process plate, followed by L5-S1 posterior fusion.   SURGEON:  Reinaldo Meeker, MD   ASSISTANT:  Kathaleen Maser. Pool, MD   PROCEDURE IN DETAIL:  After placing in the prone position, the patient's  back was prepped and draped in usual sterile fashion.  Previous lumbar  incision was opened and carried down to the spinous processes of L5 and  S1.  Subperiosteal dissection was then carried out bilaterally to  identify the spinous processes, lamina, and facet joints bilaterally.  A  self-retaining retractor was placed for exposure, and x-ray showed  approach to the appropriate level.  On the left side, previous  laminotomy was enlarged to the point where virtually the inferior three-  quarters of the L5 lamina, the medial 80% of the facet joint, and the  superior third of the S1 lamina were removed.  A scar tissue was removed  to identify the S1 nerve root going out its foramen.  The L5 nerve root  was identified.  There was a large amount of bony overgrowth with  the  spur coming off the superior facet of S1 compressing directly into the  L5 nerve.  This spur was removed in a piecemeal fashion until the L5  nerve root was completely free going out its foramen and the foramen was  completely free of any residual bony tissue.  Soft tissue within the  foramen was also removed.  Disk at L5-S1 was then entered and thoroughly  cleaned out using variety of pituitary rongeurs and curettes.  The disk  was then prepared for a transforaminal lumbar interbody fusion.  This  was distracted up to 10-mm size which was felt to be a good fit.  Rotating cutter was then used followed by a variety of curettes to  remove any residual soft tissue from the inferior edge of L5 and  superior edge of S1.  Prior to placing the PEEK interbody cage,  autologous bone and Osteocel Plus were placed deep within the interspace  to help with the interbody fusion.  The PEEK cage was then placed  without difficulty and kicked into a sideways position.  AP and lateral  fluoroscopy, it  appeared to be in excellent position.  Irrigation was  carried out at this time.  At this time, a fixation was carried out.  On  the patient's right side, a transfacet screw was placed in standard  fashion.  Drill holes were placed followed by drilling with a guidewire  followed by tapping and placement of 30-mm screw with the washer  attached.  We used intraoperative EMG monitoring to confirm no breech of  the pedicle and the electrical numbers were excellent.  AP and lateral  fluoroscopy also showed the screw to be in good position.  A 45-mm  interspinous plate was then chosen as well.  After drilling a small  trough within the sacrum, we were able to get good purchase upon the  rudimentary spinous process of S1 and large spinous process of L5.  At  this time, decortication was carried out of the lamina and facet joint  on the right S1 and a large amount of autologous bone and Osteocel Plus  were  placed in that region for posterior and posterolateral fusion.  This was done after large amounts of irrigation had carried out once  more.  Gelfoam was then placed over the dural exposure on the patient's  left side.  Final fluoroscopy in AP and lateral direction showed  excellent placement of bony spacer, transfacet screw, and spinous  process plate.  An epidural drain was then left on the patient's left  side, brought out through a separate stab wound incision.  The wound was  then closed in multiple layers of Vicryl on the muscle fascia,  subcutaneous, subcuticular tissues, and staples were placed on the skin.  A sterile dressing was then applied.  The patient was extubated and  taken to the recovery room in stable condition.           ______________________________  Reinaldo Meeker, M.D.     ROK/MEDQ  D:  12/23/2008  T:  12/24/2008  Job:  [id number]

## 2011-02-26 NOTE — Consult Note (Signed)
[name], [name]              ACCOUNT NO.:  [id number]   MEDICAL RECORD NO.:  [id number]          PATIENT TYPE:  INP   LOCATION:  2304                         FACILITY:  [facility]   PHYSICIAN:  [name], M.D.DATE OF BIRTH:  July 06, 1966   DATE OF CONSULTATION:  12/29/2008  DATE OF DISCHARGE:                                 CONSULTATION   REFERRING PHYSICIAN:  Charlcie Cradle. Delford Field, MD, FCCP   REASON FOR CONSULTATION:  Acute renal failure.   HISTORY OF PRESENT ILLNESS:  [name] is a 45 year old male with past  medical history of hypertension, gout, chronic low back pain secondary  to a S1 bone spur causing foraminal stenosis who underwent a L5/S1  fusion surgery on December 23, 2008 and was discharged home the following  morning.  Upon arriving home, the patient developed hypotension and  presyncope symptoms per the patient's wife and EMS was subsequently  called.  The patient arrived to the [facility] Emergency Department in  hypovolemic shock and developed respiratory distress/refractory  hypoxemia requiring intubation.  CT scan suggested retroperitoneal bleed  secondary to venous injury requiring an exploratory laparotomy and  packing of existing abdominal hematoma with subsequent wound VAC  placement.  Hypotension required Levophed for 24 hours and was thought  secondary to a combination of hypovolemia and possible sepsis as the  patient was noted to have a temperature spike of 102 degrees on hospital  day #2.  The patient's hemoglobin on admission was found to be 6.4 and  he required 9 units of packed red blood cells and several units of fresh  frozen plasma.  The patient's creatinine started to increase from 0.8 to  1.26 on the morning of December 27, 2008 and continued to increase  thereafter with subsequent decrease in urine output.  This coincides  with a decrease in IV fluid rate and also one dose of Lasix that was  given on the morning of December 27, 2008 and a  decrease in systolic blood  pressure to the low 100s from the 140s range.  Please note that the  majority of history was obtained from review of records and also the  patient's wife who was at bedside since the patient is currently  intubated and unresponsive.   PAST MEDICAL HISTORY:  1. Hypertension.  2. Gout, recently diagnosed by PCP.  3. History of bone spur in left elbow and lumbar spine.   ALLERGIES:  No known drug allergies.   MEDICATIONS:  1. Micardis 40 mg p.o. daily.  2. Hydrocodone 10/650 mg as needed for back pain.  3. Multivitamin 1 tablet p.o. daily.  4. Allopurinol, dose unknown.  5. Nabumetone p.r.n. for back pain.   SOCIAL HISTORY:  The patient lives with his wife and works as a  Chartered certified accountant.  He is a resident at [employer].  He has two children, both  healthy.  He denies tobacco and drug use, but drinks approximately 6  beers per week on average.   FAMILY HISTORY:  Significant for mother with diabetes mellitus and  father with hypertension.  There is no family history of  kidney disease.   REVIEW OF SYSTEMS:  Unable to be obtained secondary to patient being  intubated and sedated.   PHYSICAL EXAMINATION:  VITAL SIGNS:  Temperature of 100.3, pulse of 96,  respiratory rate of 18, blood pressure of 143/77, oxygen saturation of  98% on 40% FIO2 via ventilator.  GENERAL:  The patient was intubated and sedated, not arousable to voice.  CARDIOVASCULAR:  Tachycardia, but regular rhythm, no murmurs, rubs, or  gallops.  LUNGS:  Clear to auscultation bilaterally.  GI:  Abdomen to be somewhat tense with hypoactive bowel sounds.  The  patient had a wound VAC in place and was status post exploratory  laparotomy.  EXTREMITY:  No clubbing, cyanosis, or edema and a left internal jugular  central line.  NEUROLOGIC:  The patient to be sedated and not responsive to questions.   LABORATORY DATA:  White blood cell count of 11.1, hemoglobin of 9.6,  platelets of 190.  Sodium  141, potassium 4.1, chloride 107, bicarb 26,  BUN 55, creatinine 4.71, glucose 109, calcium 8.1, magnesium 2.6,  phosphorus 6.4, total bilirubin 4.2, alk phos 67, AST 34, ALT 31,  albumin 1.8.  Blood cultures remained negative, urine cultures are  negative, BAL culture is negative.  A renal ultrasound performed on  December 28, 2008 showed no hydronephrosis and both kidneys were within  normal limits.  A urinalysis performed on December 25, 2008 was also within  normal limits.  Of note, the patient had a creatinine trend as follows;  0.94 on December 24, 2008, 1.3 on December 25, 2008, 1.1 on December 25, 2008,  1.26 on December 26, 2008, 0.8 on December 26, 2008, 1.28 on December 27, 2008,  2.6 on December 28, 2008, 3.7 on December 28, 2008, 4.7 on December 29, 2008.  The patient's urine output was also noted to be decreased starting from  December 27, 2008 when the patient was putting out approximately 3.5  liters, which subsequently decreased to about 680 mL on the December 28, 2008 and thus far about 340 mL today.   ASSESSMENT:  1. Acute renal failure secondary to ischemic acute tubular necrosis in      the setting of relative hypotension.  2. Ventilator-dependent respiratory failure of unclear etiology.  3. Possible aspiration pneumonia and sepsis.  4. Acute blood loss anemia secondary to retroperitoneal bleed.  5. Hypertension.  6. Status post lumbar decompression surgery on December 23, 2008.   PLAN:  The patient's acute rise in creatinine and subsequent decrease in  urine output sees more temporarily related to a period of relative  hypotension resulting in ischemic acute tubular necrosis on December 27, 2008.  Although the patient's renal function is worsening acutely, he is  still producing an acceptable amount of urine.  Would continue to keep  the patient tanked up and maintain CVPs greater than 10-12 and continue  with IV Lopressor as you are currently doing for the patient's  hypertension, but would monitor his  blood pressure closely to avoid any  further episodes of hypotension.  Although the urinalysis obtained on  December 25, 2008 was largely unremarkable, will recheck again today to  look for active sediment since there has been a change from his  baseline.  Would refrain from  using Lasix for now unless urine output decreases significantly, at  which point, this could be used to stimulate further urine production.  For now, would continue supportive care and avoid nephrotoxins.  We will  continue to  follow with you and hopefully the patient's renal function  will improve as quickly as it worsened.      Lucy Antigua, MD  Electronically Signed     ______________________________  [name], M.D.    RK/MEDQ  D:  12/29/2008  T:  12/30/2008  Job:  [id number]

## 2011-02-26 NOTE — Op Note (Signed)
[name], [name]              ACCOUNT NO.:  [id number]   MEDICAL RECORD NO.:  [id number]          PATIENT TYPE:  INP   LOCATION:  2304                         FACILITY:  [facility]   PHYSICIAN:  Juleen China IV, MDDATE OF BIRTH:  [birth year]   DATE OF PROCEDURE:  12/29/2008  DATE OF DISCHARGE:                               OPERATIVE REPORT   PREOPERATIVE DIAGNOSIS:  Open abdomen.   POSTOPERATIVE DIAGNOSIS:  Open abdomen.   PROCEDURES PERFORMED:  1. Exchange of abdominal wound vac.  2. Partial abdominal closure.   SURGEON:  1. Durene Cal IV, MD   TYPE OF ANESTHESIA:  General.   ESTIMATED BLOOD LOSS:  Minimal.   FINDINGS:  Approximately two-thirds of the abdomen is now closed.   DRAINS:  None.   DRESSINGS:  Wound vac.   INDICATIONS:  This a 45 year old gentleman who presented with a  retroperitoneal hematoma following spine surgery.  He was taken  emergently to the operating room.  We elected to observe his hematoma.  We were unable to close his abdomen due to swelling.  He has had a wound  vac placed.  This is his second attempt at abdominal closure.   PROCEDURE:  The patient was taken from the surgical intensive care unit  to room #6.  He was placed supine on the table.  General anesthesia was  induced.  The patient's abdomen was prepped and draped in standard  sterile fashion.  A time-out was called.  The backing device was  removed.  All visualized bowel appeared viable.  A running #1 PDS was  used to close approximately 5 cm of the superior aspect of his incision.  A running #1 PDS  suture was used to close approximately 3 cm of the inferior aspect of  his incision.  Approximately two-thirds of the abdomen is now closed.  A  wound vacuum abdominal device was then placed back within the abdomen  and secured and then placed to suction.  The patient was returned to the  surgical intensive care unit in stable condition.            ______________________________  V. Charlena Cross, MD  Electronically Signed     VWB/MEDQ  D:  12/29/2008  T:  12/30/2008  Job:  [id number]

## 2011-02-26 NOTE — Assessment & Plan Note (Signed)
OFFICE VISIT   Kavanagh, Mukesh  DOB:  28-Jan-1966                                       02/06/2009  ZOXWR#:[id number]   REASON FOR VISIT:  Followup.   HISTORY:  This is a 45 year old gentleman who presented in extremis  following L5-S1 fusion.  He was found to have a left retroperitoneal  hematoma.  He was taken to the operating room emergently.  I was unable  to close his wound at that time.  He underwent progressive closure with  abdominal VAC system.  We were able to ultimately close the abdomen.  He  was finally discharged home.  He comes back in today for his first  postoperative visit.   He has been well.  Staples have been removed.  He has no evidence of  abdominal hernia.  His incision is well healed.   I feel the patient needs to be on weight-lifting restriction for the  next 2 weeks.  I will not schedule him to come back to see me; however,  if he has any questions, he will contact our office.  Overall from my  perspective, he is doing very well.   Jorge Ny, MD  Electronically Signed   VWB/MEDQ  D:  02/06/2009  T:  02/07/2009  Job:  [id number]   cc:   Coletta Memos, M.D.

## 2011-02-26 NOTE — Op Note (Signed)
NAMESYLVAIN, [name]              ACCOUNT NO.:  [id number]   MEDICAL RECORD NO.:  [id number]          PATIENT TYPE:  INP   LOCATION:  2304                         FACILITY:  [facility]   PHYSICIAN:  Ollen Gross. Vernell Morgans, M.D. DATE OF BIRTH:  07-28-1966   DATE OF PROCEDURE:  12/24/2008  DATE OF DISCHARGE:                               OPERATIVE REPORT   PREOPERATIVE DIAGNOSIS:  Retroperitoneal hematoma one day after lumbar  fusion surgery.   POSTOPERATIVE DIAGNOSIS:  Retroperitoneal hematoma one day after lumbar  fusion surgery.   PROCEDURES:  Exploratory laparotomy, abdominal vacuum closure.   SURGEON:  1. Charlena Cross, MD  Ollen Gross. Vernell Morgans, MD   ANESTHESIA:  General endotracheal.   PROCEDURE:  The patient was brought to the operating room by the  vascular surgeon Dr. Myra Gianotti for exploration of the abdomen after  concern for an intra-abdominal hemorrhage after lumbar fusion surgery  the day before.  Upon entering the abdomen, the patient was found to  have a large retroperitoneal hematoma that appeared to be stable and not  expanding, not pulsatile.  After much consultation with everybody  involved, it was felt that this was probably a venous injury and that  opening the retroperitoneal hematoma may cause uncontrollable hemorrhage  where as currently the hemorrhages contained and does not appear to be  ongoing.  Unfortunately, the patient had significant distention,  swelling of the abdomen and at this point the, abdomen is not able to be  closed.  We are here to assist with abdominal vacuum closure of the  abdomen.  A large plastic perforated separation device.  The vacuum  system was applied to the abdomen.  The contained adhesive strips were  not large enough to cover the vacuum device, and we had to use an Ioban  drape for coverage of the sponges and vacuum device, but once we had the  Ioban on, it appeared as though we had a good seal.  The vacuum closure  was placed to  suction via the suction device that comes with the vacuum  system and again we had a good seal.  At this point, the patient will be  taken straight to the ICU for further resuscitation measures.      Ollen Gross. Vernell Morgans, M.D.  Electronically Signed     PST/MEDQ  D:  12/24/2008  T:  12/25/2008  Job:  [id number]

## 2011-02-26 NOTE — Op Note (Signed)
NAMEARNIE, [name]              ACCOUNT NO.:  [id number]   MEDICAL RECORD NO.:  [id number]          PATIENT TYPE:  AMB   LOCATION:  SDS                          FACILITY:  [facility]   PHYSICIAN:  Reinaldo Meeker, M.D. DATE OF BIRTH:  04-28-1966   DATE OF PROCEDURE:  06/30/2007  DATE OF DISCHARGE:                               OPERATIVE REPORT   PREOPERATIVE DIAGNOSIS:  Herniated disk L5-S1 left.   POSTOPERATIVE DIAGNOSIS:  Herniated disk L5-S1 left.   PROCEDURE:  1. Left L5-S1 interlaminar laminotomy for excision of herniated disk      with operative microscope.  2. Microdissection L5-S1 disk, S1 nerve roots.   SURGEON:  Reinaldo Meeker, M.D.   ASSISTANT:  Kathaleen Maser. Pool, M.D.  or Dr. Gerlene Fee.  A Henry Pool   PROCEDURE IN DETAIL:  After being placed in the prone position, the  patient's back was prepped and draped in usual sterile fashion.  Localizing x-rays taken prior to incision to identify the appropriate  level.  Midline incision was made above the spinous processes of L5-S1.  Using Bovie cutting current, the incision was carried down the spinous  processes.  Subperiosteal dissection was then carried out on the left-  sided spinous processes and lamina.  Self-retaining retractor was placed  for exposure.  X-ray showed approach to the appropriate level.  Using a  high-speed drill the inferior one-third of the L5 lamina, medial one-  third facet joint and the superior third of the S1 lamina were removed.  Residual bone and ligamentum flavum removed in a piecemeal fashion.  Microscope was draped, brought into the field used the remainder of the  case.  Using microdissection technique, the lateral aspect of the thecal  sac and S1 nerve were identified.  Further coagulation was carried out,  down toward the canal to identify the L5-S1 disk which was markedly  herniated.  After coagulating on the annulus, this was incised with a 15-  blade.  Using pituitary rongeurs and  curettes, thorough disk space clean-  out was carried out.  At the same time great care was taken to avoid  injury to the neural elements.  This was successfully done.  At this  point space was carried out all directions for any evidence of residual  compression and none identified.  Large amounts irrigation was carried  out.  Any bleeding controlled with bipolar coagulation and Gelfoam.  The  wound was then closed in multiple layers of Vicryl in the muscle fascia,  subcutaneous and subcuticular tissues.  Staples were placed on the skin.  Sterile dressing was then applied.  The patient was extubated taken to,  in stable condition.           ______________________________  Reinaldo Meeker, M.D.     ROK/MEDQ  D:  06/30/2007  T:  06/30/2007  Job:  [id number]

## 2011-02-26 NOTE — Discharge Summary (Signed)
NAMEMONTAY, [name]              ACCOUNT NO.:  [id number]   MEDICAL RECORD NO.:  [id number]          PATIENT TYPE:  IPS   LOCATION:  4029                         FACILITY:  [facility]   PHYSICIAN:  Ranelle Oyster, M.D.DATE OF BIRTH:  05-03-1966   DATE OF ADMISSION:  01/20/2009  DATE OF DISCHARGE:  01/25/2009                               DISCHARGE SUMMARY   DISCHARGE DIAGNOSES:  1. Lumbar L5-S1 fusion on December 23, 2008, with postoperative left      retroperitoneal hematoma.  2. Acute renal failure, improved.  3. Anemia.  4. Hypertension.  5. Gout.  6. Pain management.   PROCEDURE:  Status post exploratory laparotomy and delayed closure on  December 27, 2008, of left retroperitoneal hematoma.   This is a 45 year old white male with history of chronic low back pain  with lumbar L5-S1 fusion on December 23, 2008, per Dr. Gerlene Fee who was  discharged home.  Upon arriving home, he developed hypotension, was  readmitted to the emergency department where CT scan of the abdomen and  pelvis showed a large left retroperitoneal hematoma.  He underwent  exploratory laparotomy on December 24, 2008, per Dr. Myra Gianotti and hematoma  was found not to be expanding.  Wound was left open for a time with  delayed closure secondary to distention of abdomen with a VAC applied  and later course of the acute renal failure, creatinine greater than 7.  Hemodialysis was initiated with a right IJ catheter placed on January 05, 2009, per Dr. Edilia Bo.  Renal function steadily improved with  hemodialysis held on January 13, 2009.  Creatinine improved to 4.68.  He  had been maintained on intravenous fluids since his last hemodialysis  discontinued on January 19, 2009.  Postoperative anemia 6.4, transfused on  January 11, 2009, with 2 units of packed red blood cells.  He had been on  subcutaneous Lovenox for deep vein thrombosis prophylaxis.  This was  held secondary to decreased hemoglobin.  He was attending therapies with  some  fatigue factors, pain management ongoing with a fentanyl patch, and  oxycodone as needed.  The patient was seen by Inpatient Rehab Service  and felt to be a good candidate for comprehensive rehab program.   PAST MEDICAL HISTORY:  See discharge diagnoses.   ALLERGIES:  None.   SOCIAL HISTORY:  No tobacco.  Occasional alcohol.  Lives with his wife  as well as 28- and 55 year old children.  He works as a Chartered certified accountant.  Wife  works from home and can assist as needed.  Local family check as needed.  A 1-level home, 8 steps to entry.   FUNCTIONAL HISTORY:  Prior to admission was independent.   FUNCTIONAL STATUS:  Upon admission to Rehab Services was total assist 16  feet x1 for ambulation with a Huntley Dec Plus.  The patient had 80% moderate-  assist bed mobility, +2 total assist transfers, total assist activities  of daily living.  The patient participating 40%.   MEDICATIONS PRIOR TO ADMISSION:  1. Micardis 40 mg daily.  2. Hydrocodone as needed.  3. Multivitamin daily.  4. Vitamin C.  5. Allopurinol daily.  6. Tramadol as needed.   PHYSICAL EXAMINATION:  VITAL SIGNS:  Blood pressure 145/88, pulse 84,  temperature 95, respirations 18.  GENERAL:  This is an alert male in no acute distress, oriented x3.  Deep  tendon reflexes were hypoactive.  Calves remained cool without any  swelling, erythema, and nontender.  Noted hypersensitivity to bilateral  feet.  Abdominal wound was well healed.  Back incision clean and dry.  LUNGS: Clear to auscultation.  CARDIAC:  Regular rate and rhythm.  ABDOMEN:  Soft, nontender.  Good bowel sounds.   REHABILITATION HOSPITAL COURSE:  The patient was admitted to Inpatient  Rehab Services with therapies initiated on a 3-hour daily basis  consisting of physical therapy, occupational therapy, and rehabilitation  nursing.  The following issues were addressed during the patient's  rehabilitation stay.  Pertaining to [name]' recent lumbar L5-S1  fusion on December 23, 2008, per Dr. Gerlene Fee.  Surgical site was healing  nicely.  Postoperative left retroperitoneal hematoma, exploratory  laparotomy with delayed closure on December 27, 2008, with plan to follow  up with Dr. Myra Gianotti.  He had no nausea, vomiting.  His appetite slowly  improved.  Surgical site healing nicely.  Acute renal failure again with  a steady improvement, creatinine trending downwards to 1.71.  His right  IJ catheter had been removed on January 23, 2009, without issue.  Blood  pressure monitored with Norvasc, clonidine, and Toprol with diastolic  pressures 78-89.  He denied any headache or dizziness.  He continued on  Duragesic patch as well as oxycodone for pain management.  He did have  somewhat of a gout flare-up, which improved with the use of a prednisone  taper and ongoing colchicine.   Weekly collaborative interdisciplinary team conferences were held to  discuss the patient's estimated length of stay, ongoing family teaching,  and barriers to discharge.  Functionally, he was supervisioned to  minimal assist for activities of daily living, walking short household  distances with a walker greater than 140 feet pushing a wheelchair at  times, moderate assist for standing balance.  The patient's endurance  and fatigue factors continued to improve dramatically.  His wife would  be home for the necessary assistance he would need, thus discharge was  arranged for January 25, 2009.   Latest labs showed a BUN 22, creatinine of 1.7, hemoglobin 9.2,  hematocrit 27, sodium 139, potassium 3.7.  The patient was discharged to  home.   DISCHARGE MEDICATIONS AT TIME OF DICTATION:  1. Duragesic patch 25 mcg change every 72 hours.  2. Norvasc 5 mg daily.  3. Protonix 40 mg daily.  4. Toprol-XL 200 mg daily.  5. Colchicine 0.6 mg as directed.  6. Prednisone 20 mg 3 times daily with taper.  7. Oxycodone immediate release 5 mg 1-2 tablets every 4 hours as      needed for pain, dispense of #90  tablets.   Diet was regular.   SPECIAL INSTRUCTIONS:  The patient was to follow up with Dr. Patty Sermons,  Vascular Surgery will call for appointment; continue to followed by  Renal Services as advised; Dr. Faith Rogue at the Outpatient Rehab  Service as needed.      Mariam Dollar, P.A.      Ranelle Oyster, M.D.  Electronically Signed    DA/MEDQ  D:  01/24/2009  T:  01/25/2009  Job:  [id number]   cc:   Jorge Ny, MD  Reinaldo Meeker,  M.D.  Cecille Aver, M.D.

## 2011-02-26 NOTE — Op Note (Signed)
NAMESABRE, [name]              ACCOUNT NO.:  [id number]   MEDICAL RECORD NO.:  [id number]          PATIENT TYPE:  INP   LOCATION:  2304                         FACILITY:  [facility]   PHYSICIAN:  Juleen China IV, MDDATE OF BIRTH:  10-20-1965   DATE OF PROCEDURE:  12/27/2008  DATE OF DISCHARGE:                               OPERATIVE REPORT   PREOPERATIVE DIAGNOSIS:  Open abdominal incision.   POSTOPERATIVE DIAGNOSIS:  Open abdominal incision.   PROCEDURES PERFORMED:  1. Reopening of recent laparotomy.  2. Wound VAC closure.   SURGEON:  1. Charlena Cross, MD.   Mammie LorenzoCurrie Paris, M.D.   BLOOD LOSS:  Minimal.   FINDINGS:  All laps were removed.  We were able to partially close the  superior and inferior aspect of the incision.   INDICATIONS:  This is a 45 year old gentleman who is recently status  post lumbar L5-S1 laminectomy.  He suffered a large retroperitoneal  hematoma, which was explored 2 days ago.  The hematoma was found to not  be expanding and we elected to pack this area.  His abdomen and bowel  were distended and swollen and therefore we could not close his abdomen  primarily.  A wound VAC was placed.  In addition, 9 lap pads were  placed.  The patient has been intubated and paralyzed in the ICU.  He  comes back in today for a wound VAC change.   PROCEDURE:  The patient was taken from the surgical intensive care unit  to room 8.  He was placed supine on the table.  General anesthesia was  administered.  The patient was prepped and draped in standard sterile  fashion.  A time-out was called.  Antibiotics were given.  The patient's  wound VAC was removed.  All 9 lap pads previously replaced were also  removed.  We were careful not to disrupt the bowel.  We irrigated the  wound and there was no active bleeding.  We then tried to close a  portion of the abdomen.  These were interrupted #1 PDS.  Several sutures  were placed in the inferior and  superior aspect of the wound.  The  abdomen was again too tight to close and the bowels were still  moderately distended.  We replaced the wound VAC.  The patient was taken  to recovery room and was taken back to the intensive care unit.  He  remained in stable condition.           ______________________________  V. Charlena Cross, MD  Electronically Signed    VWB/MEDQ  D:  12/28/2008  T:  12/28/2008  Job:  [id number]

## 2011-02-26 NOTE — Op Note (Signed)
NAMESAVOY, SOMERVILLE              ACCOUNT NO.:  [id number]   MEDICAL RECORD NO.:  [id number]          PATIENT TYPE:  INP   LOCATION:                               FACILITY:  [facility]   PHYSICIAN:  Di Kindle. Edilia Bo, M.D.DATE OF BIRTH:  1966-01-19   DATE OF PROCEDURE:  DATE OF DISCHARGE:                               OPERATIVE REPORT   PREOPERATIVE DIAGNOSIS:  Renal failure.   POSTOPERATIVE DIAGNOSIS:  Renal failure.   PROCEDURE:  Ultrasound-guided access placement of a right IJ Diatek  catheter (28 cm).   SURGEON:  Di Kindle. Edilia Bo, MD.   ANESTHESIA:  Local with sedation.   TECHNIQUE:  The patient was taken to the operating room, sedated by  Anesthesia.  The neck and upper chest were prepped and draped in usual  sterile fashion.  After the skin was infiltrated with 1% lidocaine and  under ultrasound guidance, the right IJ was cannulated and a guidewire  introduced into the superior vena cava under fluoroscopic control.  Tract over the wire was dilated and then a dilator and peel-away sheath  were advanced over the wire and the wire and dilator removed.  The  catheter was advanced through the peel-away sheath and positioned in the  right atrium.  The exit site for the catheter was selected and the skin  anesthetized between the two areas and the catheter was then brought  through the tunnel, cut to the appropriate length, and the distal ports  were attached.  Both ports withdrew easily.  We then flushed with  heparinized saline and filled with concentrated heparin.  Catheter was  secured at its exit site with 3-0 nylon suture.  The IJ cannulation site  was closed with a 4-0 subcuticular stitch.  Sterile dressing was  applied.  The patient tolerated the procedure well, was transferred to  the recovery room in satisfactory condition.  All needle and sponge  counts were correct.      Di Kindle. Edilia Bo, M.D.  Electronically Signed     CSD/MEDQ  D:  01/05/2009   T:  01/06/2009  Job:  [id number]

## 2011-02-26 NOTE — Discharge Summary (Signed)
NAMESERVANDO, KYLLONEN              ACCOUNT NO.:  [id number]   MEDICAL RECORD NO.:  [id number]          PATIENT TYPE:  INP   LOCATION:  2023                         FACILITY:  [facility]   PHYSICIAN:  Juleen China IV, MDDATE OF BIRTH:  1966/05/18   DATE OF ADMISSION:  12/24/2008  DATE OF DISCHARGE:                               DISCHARGE SUMMARY   FINAL DISCHARGE DIAGNOSES:  1. Retroperitoneal hematoma following L5-6 laminectomy resulting in      hypovolemic shock.  2. Hypertension.  3. Gout.  4. Postoperative acute renal failure.   PROCEDURES PERFORMED:  1. Exploratory laparotomy with findings of large retroperitoneal      hematoma and packing of abdominal wound December 24, 2008, by Dr.      Myra Gianotti and Dr. Chevis Pretty.  2. Staged closure of abdominal wound.  First procedure December 27, 2008,      by Dr. Myra Gianotti.  Second procedure December 29, 2008, by Dr. Myra Gianotti.      Last procedure December 31, 2008, by Dr. Arbie Cookey.  3. Placement of Diatek catheter January 05, 2009, by Dr. Edilia Bo.   COMPLICATIONS:  None.   CONDITION AT TRANSFER:  Steadily improving.  Wound is healing well.   TRANSFER MEDICATIONS:  At this time, his transfer medications are  anticipated to be:  1. Clonidine 0.1 mg p.o. b.i.d.  2. Fentanyl patch 25 mcg per hour, __________ every 72 hours.  3. Toprol XL 200 mg p.o. daily.  4. Ambien 10 mg p.o. q.h.s.  5. Protonix 40 mg p.o. daily.  6. Colace 100 mg p.o. b.i.d.  7. Nutritional supplement p.o. b.i.d. with meals.  8. Tylenol 1000 mg p.o. q.6 h. p.r.n. pain.  9. Mechanical DVT prophylaxis.  10.Xanax 0.5 mg p.o. b.i.d. p.r.n.  11.Oxycodone 5/325 p.o. q.4 h. p.r.n. pain.  12.MiraLax 17 g in 8 ounces of water p.o. daily p.r.n. constipation.  13.Amlodipine 2.5 mg p.o. daily.   DISPOSITION:  He is being transferred to the inpatient rehabilitation  facility at [facility]. His wounds are healing well.  He is  in stable condition.  He is expected to continue his  recovery through  rehabilitation services.  We will obtain an appointment for him in 1  month at [facility].  The office will arrange the  visit.   BRIEF IDENTIFYING STATEMENT:  For complete details, please refer to the  history and physical.  Briefly, this very pleasant 45 year old gentleman  had undergone an L5-6 laminectomy the day prior to his presentation to  the emergency room.  The procedure was uneventful.  He was discharged  home.  At home, he developed back pain.  He developed a feeling of  lightheadedness.  He returned to the emergency department.  A CT scan  was obtained, which demonstrated a retroperitoneal hematoma.  He was  evaluated by Dr. Myra Gianotti and Dr. Carolynne Edouard.  At Dr. Estanislado Spire evaluation, he  was found to be in extremis.  Dr. Myra Gianotti recommended emergency  laparotomy for control of bleeding.  His family was informed of the  risks and benefits of the procedure and after  careful consideration,  they elected to proceed with surgery.   HOSPITAL COURSE:  Preoperative workup continued on an urgent basis.  He  was taken to the operating room as an emergency case.  An exploratory  laparotomy was performed on December 24, 2008.  For complete details,  please refer to the typed operative report.  Essentially, the hematoma  was contained within the retroperitoneal space.  That space was not  entered.  He was resuscitated in the operating room.  His abdomen was  packed closed with a Wound VAC in place.  He was returned to the  intensive care unit in hemodynamically unstable condition.  Over the  early morning hours, his hemodynamics improved and he did become stable.  He remained in the intensive care unit over the next several days.  During this time, he underwent partial closures of his abdominal wound,  the first as listed above.  His abdomen was fully closed by December 31, 2008.  He tolerated closure very well.   Postoperatively, he developed acute renal  failure.  We asked the renal  physicians to evaluate this and appreciate their diligent assistance.  He did require several rounds of hemodialysis.  For this purpose, a  Diatek catheter was placed on January 05, 2009, by Dr. Edilia Bo.  He  displayed a slow but steady recovery.  We were able to transfer him from  the intensive care unit to a bed in a surgical step-down unit and then  to a bed on a surgical convalescent floor.  His diet was advanced as  tolerated.  His kidney function was returning.  He was improving with  physical and occupational therapy.  We felt him to be a suitable  candidate for rehabilitation and asked the [facility] inpatient rehabilitation  facility to evaluate him.  They concurred with his evaluation as a  candidate for rehab and when he is medically stable will be transferred  to them.      Wilmon Arms, PA      V. Charlena Cross, MD  Electronically Signed    KEL/MEDQ  D:  01/19/2009  T:  01/19/2009  Job:  [id number]

## 2011-02-26 NOTE — H&P (Signed)
[name], [name]              ACCOUNT NO.:  [id number]   MEDICAL RECORD NO.:  [id number]          PATIENT TYPE:  IPS   LOCATION:  4029                         FACILITY:  [facility]   PHYSICIAN:  Ranelle Oyster, M.D.DATE OF BIRTH:  06-Aug-1966   DATE OF ADMISSION:  01/20/2009  DATE OF DISCHARGE:                              HISTORY & PHYSICAL   CHIEF COMPLAINT:  Back pain, deconditioning.   HISTORY OF PRESENT ILLNESS:  This is a 45 year old white male with a  complaint of chronic low back pain, who had an L5-S1 fusion by Dr.  Gerlene Fee on December 23, 2008, and was discharged home.  Upon arriving home  he developed hypotension and was readmitted to the emergency department.  He had a large left retroperitoneal hematoma on CAT scan, and he  underwent exploratory lap on December 24, 2008, by Dr. Myra Gianotti.  Wound was  left open for a time with delayed closure secondary to distention of the  abdomen with vacuum applied later and then the patient's wound was  ultimately closed on December 27, 2008.  Hospital course was complicated by  acute renal failure and creatinine increasing to 7.0.  Hemodialysis was  initiated via right IJ catheter, placed by Dr. Edilia Bo.  Renal functions  steadily improved.  He had been on IV fluids for a period time, which  were discontinued today.  He was transfused 2 units of packed red blood  cells for a hemoglobin of 6.3 on January 11, 2009.  He had also been on  Lovenox for DVT prophylaxis, which was held due to decreased hemoglobin.  Fatigue has been an issue as well as pain in his feet.  He has been  placed on a fentanyl patch as well as oxycodone for better pain control.  Rehab is followed along with the patient since initial evaluation on  January 09, 2009, and ultimately after observing this patient, it was felt  that he could benefit from an inpatient stay once his endurance  improved.   REVIEW OF SYSTEMS:  Notable for low back pain and pain in his feet and  toes,  particularly with ambulation.  Appetite slowly improving.  Other  pertinent positives are listed above and full review of 14 point review  is in the written H and P.   PAST MEDICAL HISTORY:  Positive for hypertension, gout, bone spurs on  left elbow, positive alcohol use, and occasional tobacco.   FAMILY HISTORY:  Positive for CAD.   SOCIAL HISTORY:  The patient lives with his wife and 75 and 38 year old  children.  He works as a Chartered certified accountant.  Wife works from home and can assist  as needed.  Other local family also can assist.  He has 1-level house  with 8 steps to enter.   ALLERGIES:  None.   HOME MEDICATIONS:  Micardis, hydrocodone, multivitamin, vitamin C,  allopurinol, and tramadol p.r.n.   LABORATORY DATA:  Hemoglobin 8.4, white count 11, platelets 479.  Sodium  137, potassium 3.2, BUN 46, creatinine 4.68.   PHYSICAL EXAMINATION:  VITAL SIGNS:  Blood pressure 145/88, pulse is 84,  respiratory rate  18, temperature 98.5.  GENERAL:  The patient is generally pleasant, alert, and oriented x3.  HEENT:  Pupils equal, round, and reactive to light and accommodation.  Ear, nose, and throat exam is notable for intact dentition and pink  moist mucosa.  NECK:  Supple without JVD or lymphadenopathy.  CHEST:  Clear to auscultation bilaterally without wheezes, rales, or  rhonchi.  HEART:  Regular rate and rhythm without murmur or gallops.  ABDOMEN:  Large sagittal incision, which is clean and intact.  Bowel  sounds are positive and area is nontender, nondistended.  SKIN:  Otherwise, skin was generally intact throughout without any signs  of breakdown.  MUSCULOSKELETAL:  Notable for pain.  There is some swelling over both  MTPs.  He had a lesser amount of swelling and pain over the other digits  of the feet.  NEUROLOGIC:  Cranial nerves II through XII are grossly intact.  Reflexes  are 1+.  Sensation is grossly intact throughout.  Judgment, orientation,  memory, and mood were all  functional today.  Strength is generally 4/5  in proximal upper extremities to 4+/5 distally.  Lower extremity  strength is 2+ to 3/5 proximally with tremor and weakness with leg lift.  Knee and ankle movement was near 3+ to 4/5.   POST ADMISSION PHYSICIAN EVALUATION:  1. Functional deficit secondary to deconditioning after L5-S1 fusion      with left retroperitoneal hematoma.  2. The patient is admitted to receive collaborative interdisciplinary      care between the physiatrist, rehab nursing staff, and therapy      team.  3. The patient's level of medical complexity and substantial therapy      needs in context of that medical necessity cannot be provided at a      lesser intensity of care.  4. The patient has experienced substantial functional loss from his      baseline.  Prior to arrival, the patient was independent.  Upon      rehab evaluation, on January 09, 2009, the patient was total      assistance for basic transfers and bed mobility.  Most recently, he      is total assist 16 feet for gait using a SARA Plus.  He has been      mod assist for bed mobility with +2 total assist for transfers from      the bed.  He has been max assist for ADLs.  Based on the patient's      diagnosis, physical exam, and functional history, he has potential      for functional progress, which will result in measurable gains      while inpatient rehab.  These gains will be of substantial and      practical use upon discharge to home in facilitating mobility and      self-care.  Interim changes since our preadmission consult are      detailed in the history of present illness above.  5. Physiatrist will provide 24-hour management of medical needs, as      well as oversight of therapy plan/treatment, and provide guidance      as appropriate regarding interaction of the 2.  Medical problem      list and plan are listed below.  6. 24-hour rehab nursing will assist in the management of the       patient's pain issues, as well as nutrition, skin care, bowel and      bladder, safety awareness,  integration of therapy concepts and      techniques.  7. PT will assess and treat for lower extremity strength, functional      mobility, gait, range of motion, strengthening, neuromuscular      reeducation, adaptive equipment, and family education with goals,      supervision to modified independent.  8. OT will assess and treat for upper extremity use and ADLs, as well      as safety awareness, adaptive equipment, and family education with      goals at a supervision to occasional min assist level.  9. Case management and social worker will assess and treat for      psychosocial issues and discharge planning.  10.Team conferences will be held weekly to assess progress towards      goals and to determine barriers at discharge.  11.The patient had to demonstrate sufficient medical stability and      exercise capacity to tolerate at least 3 hours of therapy per day,      at least 5 days per week.  12.Estimated length of stay is 2-1/2 weeks.  Prognosis is fair to      good.   MEDICAL PROBLEM LIST AND PLAN:  1. Acute renal failure:  The patient has been off hemodialysis since      January 13, 2009.  We will watch Is and Os, and the patient's overall      nutrition seems to be improving as well.  2. Anemia:  Continue Aranesp per routine, and we will check a serial      CBCs.  No active signs of bleeding on exam today.  3. Blood pressure control:  Continue Norvasc, clonidine, and Toprol.      The patient is borderline high this afternoon.  We will continue to      follow on a daily basis.  4. Pain control:  Continue Duragesic patch and oxycodone IR p.r.n.      Need to look at prednisone and colchicine combination for gouty      flare as this seems to be inhibiting his functional abilities and      therapy tolerance as well.  5. Deep venous thrombosis prophylaxis with sequential compression       devices.      Ranelle Oyster, M.D.  Electronically Signed     ZTS/MEDQ  D:  01/20/2009  T:  01/21/2009  Job:  [id number]

## 2011-02-26 NOTE — Op Note (Signed)
[name], [name]              ACCOUNT NO.:  [id number]   MEDICAL RECORD NO.:  [id number]          PATIENT TYPE:  INP   LOCATION:  2304                         FACILITY:  [facility]   PHYSICIAN:  Juleen China IV, MDDATE OF BIRTH:  November 10, 1965   DATE OF PROCEDURE:  12/24/2008  DATE OF DISCHARGE:                               OPERATIVE REPORT   PREOPERATIVE DIAGNOSIS:  Retroperitoneal hematoma.   POSTOPERATIVE DIAGNOSIS:  Retroperitoneal hematoma.   PROCEDURE PERFORMED:  Exploratory laparotomy.   SURGEON:  1. Charlena Cross, MD   Tina GriffithsOllen Gross. Vernell Morgans, MD   ANESTHESIA:  General.   ASSISTANT:  None.   DRESSINGS:  Abdominal VAC.   FINDINGS:  Large left-sided nonexpanding retroperitoneal hematoma.   INDICATIONS:  This is a 45 year old gentleman who 1 day prior had  undergone a L5-L6 laminectomy through a posterior approach.  The surgery  was uncomplicated, and the patient was discharged home earlier this  morning.  He re-presented today in extremis.  He has undergone a CT  scan, which shows a large retroperitoneal hematoma.  The patient was  intubated in the emergency department and was taken emergently to the  operating room for exploration.   PROCEDURE:  The patient was taken from the emergency department to OR  #6.  He had previously been intubated.  He was placed in supine on the  table.  Additional hemodynamic monitoring was performed by Anesthesia.  The patient was then prepped and draped in standard sterile fashion and  a time-out was called.  A midline incision was made from the xiphoid to  the pubis.  Subcutaneous tissue was divided with cautery.  The midline  abdominal fascia was then opened with cautery.  This was done throughout  the length of the incision.  The abdomen was then entered, and the  incision was opened throughout its length.  The abdomen was packed and  then explored.  The patient was found to have a large nonexpanding left-  sided  retroperitoneal hematoma.  The patient's bowel was extremely  distended and his abdomen was very tight.  On exploration of the  abdomen, the retroperitoneal hematoma was primarily left sided.  There  was minimal evidence of hematoma crossing over the midline to the right  side.  An Omni-Tract retractor was then placed.  The colon was lifted  cephalad and the small bowel mobilized to the patient's right side of  the body.  We observed this hematoma.  Anesthesia continued to  resuscitate the patient.  Once the patient was better resuscitated, his  hemodynamic profile stabilized.  We observed this hematoma for  approximately 30 minutes and it did not appear to be expanding.  Dr.  Jordan Likes was also in the operating room observing, and after discussion with  Dr. Carolynne Edouard, Myself, and Dr. Jordan Likes, we felt that it was best not to enter  this retroperitoneal hematoma for fear of not being able to access the  source of bleeding, as it was likely venous and that was also  controlled and not expanding at this time.  We felt it was best  to pack  the patient's abdomen to facilitate hemostasis and place a temporary  closure.  Dr. Carolynne Edouard placed an abdominal VAC dressing.  Once this was  completed, the patient was taken to the Surgical Intensive Care Unit  with continued resuscitation.           ______________________________  V. Charlena Cross, MD  Electronically Signed     VWB/MEDQ  D:  12/25/2008  T:  12/26/2008  Job:  [id number]

## 2011-02-26 NOTE — Assessment & Plan Note (Signed)
[name] is back regarding his lumbar spine surgery with retroperitoneal  hematoma and subsequent deconditioning.  The patient was discharged from  Rehab on January 25, 2009, and has been home receiving outpatient therapy  and doing quite well.  His gout has been his only real problem, although  this is improved quite a bit.  He had a flare about a week ago which is  improved.  His low back is stiff in the mornings.  Wife notes some  rotation of his right leg when he walks, but Staton states this is more  to compensate for some of the ankle discomfort he still has from the  gout.  He is sleeping well.  He is using 2 or 3 Vicodin a day for  breakthrough pain.  He is on colchicine b.i.d.  He is not back on an  allopurinol as of yet.   REVIEW OF SYSTEMS:  Notable for weakness, trouble walking, spasms, some  depression, anxiety, nausea, vomiting, weight loss.  Other pertinent  positives are above, and full review is in the written health and  history section of the chart.   SOCIAL HISTORY:  The patient is married and wife is supportive.  He  would like to go back to work in July.  He works as a Chartered certified accountant.   PHYSICAL EXAMINATION:  Blood pressure 117/74, pulse 70, respiratory rate  18.  He is sating 100% on room air.  The patient is pleasant, alert, and  oriented x3.  Affect is generally bright and appropriate.  Strength is  really 5/5 in all 4 extremities.  He does walk with a bit of external  rotation at the right leg but when cued can improve to a more standard  gait pattern.  Sensation is intact in all four.  He has some pain still  at the right ankle with minimal swelling and no redness is seen.  Cognitively, he is appropriate.  He has good insight, awareness,  attention, etc.  Heart is regular.  Chest is clear.  Abdomen is soft,  nontender.  He has an abdominal wound which is now well healed.   ASSESSMENT:  1. History of lumbar spine fusion and secondary complications      including  retroperitoneal hematoma with deconditioning.  2. Acute renal failure.  3. Gouty arthritis.   PLAN:  1. I think he can restart his allopurinol.  I do not have his home      dose and we will resume.  He will call with any questions.  It      sounds as if it may be 100 or 200 mg daily.  2. He can use his colchicine on a p.r.n. basis or scheduled if he      desires and feels better this way.  3. I gave him prednisone 20 mg q.12 hours p.r.n. for any breakthrough      gout symptoms.  4. Discussed dietary intake exercise.  5. I want Physical Therapy to work on work stimulation to help improve      tolerance and other activities.  6. I refilled his hydrocodone 10/650 one q.6 hours p.r.n. #90.  7. We will see him back in about 3 months' time.  The patient has      signed controlled substance agreement today in the office, and      urine specimen was collected.      Ranelle Oyster, M.D.  Electronically Signed     ZTS/MedQ  D:  03/08/2009 12:33:45  T:  03/09/2009 03:16:47  Job #:  [id number]

## 2011-02-26 NOTE — Telephone Encounter (Signed)
Refill request

## 2011-04-01 ENCOUNTER — Telehealth: Payer: Self-pay

## 2011-04-01 ENCOUNTER — Other Ambulatory Visit: Payer: Self-pay | Admitting: Internal Medicine

## 2011-04-01 MED ORDER — HYDROCODONE-ACETAMINOPHEN 10-650 MG PO TABS: 1.0000 | ORAL_TABLET | Freq: Three times a day (TID) | ORAL | Status: DC | PRN

## 2011-04-01 NOTE — Telephone Encounter (Signed)
Received fax from pharmacy requesting hydrocodone 10-650. Patient last seen 01/23/11 and last rx given 11/16/10 #90/3rf. Please advise if ok to refill? Thanks

## 2011-04-01 NOTE — Telephone Encounter (Signed)
Refill once then f/up

## 2011-05-01 ENCOUNTER — Telehealth: Payer: Self-pay

## 2011-05-01 NOTE — Telephone Encounter (Signed)
Please advise if ok to refill. 

## 2011-05-02 MED ORDER — HYDROCODONE-ACETAMINOPHEN 10-650 MG PO TABS: 1.0000 | ORAL_TABLET | Freq: Three times a day (TID) | ORAL | Status: DC | PRN

## 2011-05-02 NOTE — Telephone Encounter (Signed)
yes

## 2011-05-28 ENCOUNTER — Encounter: Payer: Self-pay | Admitting: Internal Medicine

## 2011-05-28 ENCOUNTER — Ambulatory Visit (INDEPENDENT_AMBULATORY_CARE_PROVIDER_SITE_OTHER): Payer: Managed Care, Other (non HMO) | Admitting: Internal Medicine

## 2011-05-28 DIAGNOSIS — F329 Major depressive disorder, single episode, unspecified: Secondary | ICD-10-CM

## 2011-05-28 DIAGNOSIS — I10 Essential (primary) hypertension: Secondary | ICD-10-CM

## 2011-05-28 DIAGNOSIS — M109 Gout, unspecified: Secondary | ICD-10-CM

## 2011-05-28 DIAGNOSIS — F3289 Other specified depressive episodes: Secondary | ICD-10-CM

## 2011-05-28 MED ORDER — COLCHICINE 0.6 MG PO TABS: 0.6000 mg | ORAL_TABLET | Freq: Two times a day (BID) | ORAL | Status: DC

## 2011-05-28 MED ORDER — BUPROPION HCL ER (XL) 150 MG PO TB24: 150.0000 mg | ORAL_TABLET | Freq: Every day | ORAL | Status: DC

## 2011-05-28 NOTE — Patient Instructions (Signed)
Gout  Gout is an inflammatory condition (arthritis) caused by a buildup of uric acid crystals in the joints. Uric acid is a chemical that is normally present in the blood. Under some circumstances, uric acid can form into crystals in your joints. This causes joint redness, soreness, and swelling (inflammation). Repeat attacks are common. Over time, uric acid crystals can form into masses (tophi) near a joint, causing disfigurement. Gout is treatable and often preventable.  CAUSES  The disease begins with elevated levels of uric acid in the blood. Uric acid is produced by your body when it breaks down a naturally found substance called purines. This also happens when you eat certain foods such as meats and fish. Causes of an elevated uric acid level include:   Being passed down from parent to child (heredity).    Diseases that cause increased uric acid production (obesity, psoriasis, some cancers).    Excessive alcohol use.    Diet, especially diets rich in meat and seafood.    Medicines, including certain cancer-fighting drugs (chemotherapy), diuretics, and aspirin.    Chronic kidney disease. The kidneys are no longer able to remove uric acid well.    Problems with metabolism.   Conditions strongly associated with gout include:   Obesity.    High blood pressure.    High cholesterol.    Diabetes.   Not everyone with elevated uric acid levels gets gout. It is not understood why some people get gout and others do not. Surgery, joint injury, and eating too much of certain foods are some of the factors that can lead to gout.  SYMPTOMS   An attack of gout comes on quickly. It causes intense pain with redness, swelling, and warmth in a joint.    Fever can occur.    Often, only one joint is involved. Certain joints are more commonly involved:    Base of the big toe.    Knee.    Ankle.    Wrist.    Finger.    Without treatment, an attack usually goes away in a few days to weeks. Between attacks, you usually will not have symptoms, which is different from many other forms of arthritis.  DIAGNOSIS  Your caregiver will suspect gout based on your symptoms and exam. Removal of fluid from the joint (arthrocentesis) is done to check for uric acid crystals. Your caregiver will give you a medicine that numbs the area (local anesthetic) and use a needle to remove joint fluid for exam. Gout is confirmed when uric acid crystals are seen in joint fluid, using a special microscope. Sometimes, blood, urine, and X-ray tests are also used.  TREATMENT  There are 2 phases to gout treatment: treating the sudden onset (acute) attack and preventing attacks (prophylaxis).  Treatment of an Acute Attack   Medicines are used. These include anti-inflammatory medicines or steroid medicines.    An injection of steroid medicine into the affected joint is sometimes necessary.    The painful joint is rested. Movement can worsen the arthritis.    You may use warm or cold treatments on painful joints, depending which works best for you.    Discuss the use of coffee, vitamin C, or cherries with your caregiver. These may be helpful treatment options.   Treatment to Prevent Attacks  After the acute attack subsides, your caregiver may advise prophylactic medicine. These medicines either help your kidneys eliminate uric acid from your body or decrease your uric acid production. You may need to stay   on these medicines for a very long time.  The early phase of treatment with prophylactic medicine can be associated with an increase in acute gout attacks. For this reason, during the first few months of treatment, your caregiver may also advise you to take medicines usually used for acute gout treatment. Be sure you understand your caregiver's directions.   You should also discuss dietary treatment with your caregiver. Certain foods such as meats and fish can increase uric acid levels. Other foods such as dairy can decrease levels. Your caregiver can give you a list of foods to avoid.  HOME CARE INSTRUCTIONS   Do not take aspirin to relieve pain. This raises uric acid levels.    Only take over-the-counter or prescription medicines for pain, discomfort, or fever as directed by your caregiver.    Rest the joint as much as possible. When in bed, keep sheets and blankets off painful areas.    Keep the affected joint raised (elevated).    Use crutches if the painful joint is in your leg.    Drink enough water and fluids to keep your urine clear or pale yellow. This helps your body get rid of uric acid. Do not drink alcoholic beverages. They slow the passage of uric acid.    Follow your caregiver's dietary instructions. Pay careful attention to the amount of protein you eat. Your daily diet should emphasize fruits, vegetables, whole grains, and fat-free or low-fat milk products.    Maintain a healthy body weight.   SEEK MEDICAL CARE IF:   You have an oral temperature above 100.5.    You develop diarrhea, vomiting, or any side effects from medicines.    You do not feel better in 24 hours, or you are getting worse.   SEEK IMMEDIATE MEDICAL CARE IF:   Your joint becomes suddenly more tender and you have:    Chills.    An oral temperature above 100.5, not controlled by medicine.   MAKE SURE YOU:   Understand these instructions.    Will watch your condition.    Will get help right away if you are not doing well or get worse.   Document Released: 09/27/2000 Document Re-Released: 03/20/2010  Exit[employer] Patient Information 2011 [employer].

## 2011-05-28 NOTE — Progress Notes (Signed)
Subjective:    Patient ID: [name], male    DOB: 13-Apr-1966, 45 y.o.   MRN: [id number]  Toe Pain  The incident occurred 3 to 5 days ago. There was no injury mechanism. The pain is present in the right foot. The quality of the pain is described as stabbing. The pain is at a severity of 4/10. The pain is mild. The pain has been constant since onset. Pertinent negatives include no inability to bear weight, loss of motion, loss of sensation, muscle weakness, numbness or tingling. He reports no foreign bodies present. The symptoms are aggravated by weight bearing.  Also, he wants to stop taking adderall and change to an antidepressant that offers more for help with motivation and low energy.    Review of Systems  Constitutional: Positive for fatigue. Negative for fever, chills, diaphoresis, activity change, appetite change and unexpected weight change.  HENT: Negative for sore throat, facial swelling, trouble swallowing, neck pain, neck stiffness and voice change.   Eyes: Negative for photophobia, redness and visual disturbance.  Respiratory: Negative for apnea, cough, choking, chest tightness, shortness of breath, wheezing and stridor.   Gastrointestinal: Negative for nausea, vomiting, abdominal pain, diarrhea, constipation, blood in stool, abdominal distention and anal bleeding.  Genitourinary: Negative for dysuria, urgency, frequency, hematuria, flank pain, decreased urine volume and difficulty urinating.  Musculoskeletal: Positive for joint swelling (same) and arthralgias (right foot 1st MTP joint). Negative for myalgias, back pain and gait problem.  Skin: Negative for color change, pallor, rash and wound.  Neurological: Negative for dizziness, tingling, tremors, seizures, syncope, facial asymmetry, speech difficulty, weakness, light-headedness, numbness and headaches.  Hematological: Negative for adenopathy. Does not bruise/bleed easily.  Psychiatric/Behavioral: Positive for dysphoric  mood and decreased concentration. Negative for suicidal ideas, hallucinations, behavioral problems, confusion, sleep disturbance, self-injury and agitation. The patient is not nervous/anxious and is not hyperactive.        Objective:   Physical Exam  Vitals reviewed. Constitutional: He is oriented to person, place, and time. He appears well-developed and well-nourished. No distress.  HENT:  Head: Normocephalic and atraumatic.  Right Ear: External ear normal.  Left Ear: External ear normal.  Nose: Nose normal.  Mouth/Throat: Oropharynx is clear and moist. No oropharyngeal exudate.  Eyes: Conjunctivae and EOM are normal. Pupils are equal, round, and reactive to light. Right eye exhibits no discharge. Left eye exhibits no discharge. No scleral icterus.  Neck: Normal range of motion. Neck supple. No JVD present. No tracheal deviation present. No thyromegaly present.  Cardiovascular: Normal rate, regular rhythm, normal heart sounds and intact distal pulses.  Exam reveals no gallop and no friction rub.   No murmur heard. Pulmonary/Chest: Effort normal and breath sounds normal. No stridor. No respiratory distress. He has no wheezes. He has no rales. He exhibits no tenderness.  Abdominal: Soft. Bowel sounds are normal. He exhibits no distension and no mass. There is no tenderness. There is no rebound and no guarding.  Musculoskeletal: Normal range of motion. He exhibits tenderness. He exhibits no edema.       Right foot: He exhibits tenderness, bony tenderness and swelling. He exhibits normal range of motion, normal capillary refill, no crepitus and no deformity.       Feet:       Right foot shows erythema, swelling, warmth, ttp over the 1st MTP joint  Lymphadenopathy:    He has no cervical adenopathy.  Neurological: He is alert and oriented to person, place, and time. He has normal reflexes. He  displays normal reflexes. No cranial nerve deficit. He exhibits normal muscle tone. Coordination  normal.  Skin: Skin is warm and dry. No rash noted. He is not diaphoretic. No erythema. No pallor.  Psychiatric: He has a normal mood and affect. His behavior is normal. Judgment and thought content normal.          Lab Results  Component Value Date   WBC 7.0 01/23/2011   HGB 16.2 01/23/2011   HCT 46.1 01/23/2011   PLT 197.0 01/23/2011   ALT 32 02/22/2009   AST 27 02/22/2009   NA 140 01/23/2011   K 3.9 01/23/2011   CL 101 01/23/2011   CREATININE 1.0 01/23/2011   BUN 17 01/23/2011   CO2 29 01/23/2011   TSH 0.90 02/22/2009   INR 1.3 01/13/2009   HGBA1C 5.4 02/22/2009   Assessment & Plan:

## 2011-05-29 NOTE — Assessment & Plan Note (Signed)
Will stop adderall and start wellbutrin

## 2011-05-29 NOTE — Assessment & Plan Note (Signed)
BP is well controlled 

## 2011-05-29 NOTE — Assessment & Plan Note (Signed)
He was given an injection of depo-medrol IM to reduce the pain and swelling and will start colchicine for the pain and inflammation

## 2011-06-03 ENCOUNTER — Other Ambulatory Visit: Payer: Self-pay | Admitting: *Deleted

## 2011-06-03 NOTE — Telephone Encounter (Signed)
Fax from pharmacy requesting refill on hydrocodone 10-650.  Last filled on 05/02/11 #90 w/6 refills.  Per Abby at [facility] there system shows 0 (zero) refills.  Review of hardcopy on file at [facility] shows 6 refills.  Abby will fix RX in there system.  No further follow up needed by our office.

## 2011-06-27 ENCOUNTER — Ambulatory Visit (INDEPENDENT_AMBULATORY_CARE_PROVIDER_SITE_OTHER): Payer: Managed Care, Other (non HMO) | Admitting: Internal Medicine

## 2011-06-27 ENCOUNTER — Encounter: Payer: Self-pay | Admitting: Internal Medicine

## 2011-06-27 VITALS — BP 100/64 | HR 61 | Temp 98.8°F | Resp 16 | Wt 202.0 lb

## 2011-06-27 DIAGNOSIS — F3289 Other specified depressive episodes: Secondary | ICD-10-CM

## 2011-06-27 DIAGNOSIS — M109 Gout, unspecified: Secondary | ICD-10-CM

## 2011-06-27 DIAGNOSIS — M545 Low back pain, unspecified: Secondary | ICD-10-CM

## 2011-06-27 DIAGNOSIS — R5381 Other malaise: Secondary | ICD-10-CM

## 2011-06-27 DIAGNOSIS — R5383 Other fatigue: Secondary | ICD-10-CM | POA: Insufficient documentation

## 2011-06-27 DIAGNOSIS — F329 Major depressive disorder, single episode, unspecified: Secondary | ICD-10-CM

## 2011-06-27 DIAGNOSIS — I10 Essential (primary) hypertension: Secondary | ICD-10-CM

## 2011-06-27 MED ORDER — ARMODAFINIL 150 MG PO TABS: 150.0000 mg | ORAL_TABLET | Freq: Every day | ORAL | Status: AC

## 2011-06-27 MED ORDER — ALLOPURINOL 100 MG PO TABS: 100.0000 mg | ORAL_TABLET | Freq: Every day | ORAL | Status: DC

## 2011-06-27 NOTE — Patient Instructions (Signed)
Fatigue  Fatigue is a feeling of tiredness, lack of energy, lack of motivation, or feeling tired all the time. Having enough rest, good nutrition, and reducing stress will normally reduce fatigue. Consult your caregiver if it persists. The nature of your fatigue will help your caregiver to find out its cause. The treatment is based on the cause.   CAUSES  There are many causes for fatigue. Most of the time, fatigue can be traced to one or more of your habits or routines. Most causes fit into one or more of three general areas. They are:  Lifestyle problems  · Sleep disturbances.  · Overwork.   · Physical exertion.  · Unhealthy habits  · Poor eating habits or eating disorders   · Alcohol and/or drug use   · Lack of proper nutrition (malnutrition).    Psychological problems  · Stress and/or anxiety problems.  · Depression.  · Grief.  · Boredom.    Medical Problems or Conditions  · Anemia.  · Pregnancy.   · Thyroid gland problems.   · Recovery from major surgery.   · Continuous pain.   · Emphysema or asthma that is not well controlled   · Allergic conditions.   · Diabetes.   · Infections (such as mononucleosis).   · Obesity.  · Sleep disorders, such as sleep apnea.  · Heart failure or other heart-related problems.   · Cancer.   · Kidney disease.   · Liver disease.   · Effects of certain medicines such as antihistamines, cough and cold remedies, prescription pain medicines, heart and blood pressure medicines, drugs used for treatment of cancer, and some antidepressants.    SYMPTOMS  The symptoms of fatigue include:   · Lack of energy.  · Lack of drive (motivation).  · Drowsiness.  · Feeling of indifference to the surroundings.    DIAGNOSIS  The details of how you feel help guide your caregiver in finding out what is causing the fatigue. You will be asked about your present and past health condition. It is important to review all medicines that you take, including prescription and non-prescription items. A thorough exam  will be done. You will be questioned about your feelings, habits, and normal lifestyle. Your caregiver may suggest blood tests, urine tests, or other tests to look for common medical causes of fatigue.   TREATMENT  Fatigue is treated by correcting the underlying cause. For example, if you have continuous pain or depression, treating these causes will improve how you feel. Similarly, adjusting the dose of certain medicines will help in reducing fatigue.   HOME CARE INSTRUCTIONS  · Try to get the required amount of good sleep every night.   · Eat a healthy and nutritious diet, and drink enough water throughout the day.   · Practice ways of relaxing (including yoga or meditation).   · Exercise regularly.   · Make plans to change situations that cause stress. Act on those plans so that stresses decrease over time. Keep your work and personal routine reasonable.   · Avoid street drugs and minimize use of alcohol.   · Start taking a daily multivitamin after consulting your caregiver.   SEEK MEDICAL CARE IF:  · You have persistent tiredness, which cannot be accounted for.   · You have fever.   · You have unintentional weight loss.   · You have headaches.   · You have disturbed sleep throughout the night.   · You are feeling sad.   ·   You have constipation.   · You have dry skin.   · You have gained weight.   · You are taking any new or different medicines that you suspect are causing fatigue.   · You are unable to sleep at night.   · You develop any unusual swelling of your legs or other parts of your body.   SEEK IMMEDIATE MEDICAL CARE IF:  · You are feeling confused.   · Your vision is blurred.   · You feel faint or pass out.   · You develop severe headache.   · You develop severe abdominal, pelvic, or back pain.   · You develop chest pain, shortness of breath, or an irregular or fast heartbeat.   · You are unable to pass a normal amount of urine.   · You develop abnormal bleeding such as bleeding from the rectum or you  vomit blood.   · You have thoughts about harming yourself or committing suicide.   · You are worried that you might harm someone else.   MAKE SURE YOU:   · Understand these instructions.   · Will watch your condition.   · Will get help right away if you are not doing well or get worse.   REFERENCES   · National Library of Medicine   http://www.nlm.nih.gov/medlineplus/ency/article/003088.htm  · [employer] Institute   http://www.cancer.gov/cancertopics/pdq/supportivecare/fatigue/Patient  Document Released: 07/28/2007 Document Re-Released: 09/12/2008  [employer]® Patient Information ©2011 [employer].

## 2011-06-27 NOTE — Assessment & Plan Note (Signed)
His BP is well controlled 

## 2011-06-27 NOTE — Progress Notes (Signed)
  Subjective:    Patient ID: [name], male    DOB: 03/06/1966, 45 y.o.   MRN: [id number]  HPI  He returns and complains of fatigue and sleepiness during the day. He had to stop taking wellbutrin because he developed rage, and he wants to stop taking lexapro because he does not feel depressed or anxious anymore.  Review of Systems  Constitutional: Positive for fatigue and unexpected weight change (mild weight gain). Negative for fever, chills, diaphoresis, activity change and appetite change.  HENT: Negative for sore throat, facial swelling, neck pain, neck stiffness and voice change.   Eyes: Negative for photophobia, pain, discharge, redness, itching and visual disturbance.  Respiratory: Negative for apnea, cough, choking, chest tightness, shortness of breath, wheezing and stridor.   Cardiovascular: Negative for chest pain, palpitations and leg swelling.  Gastrointestinal: Negative for nausea, vomiting, abdominal pain, diarrhea, constipation, blood in stool, abdominal distention, anal bleeding and rectal pain.  Genitourinary: Negative for dysuria, urgency, frequency, hematuria, flank pain, decreased urine volume, enuresis and difficulty urinating.  Musculoskeletal: Negative for myalgias, back pain, joint swelling, arthralgias and gait problem.  Skin: Negative for color change, pallor, rash and wound.  Neurological: Negative for dizziness, tremors, seizures, syncope, facial asymmetry, speech difficulty, weakness, light-headedness, numbness and headaches.  Hematological: Negative for adenopathy. Does not bruise/bleed easily.  Psychiatric/Behavioral: Positive for sleep disturbance (he is a heavy snorer). Negative for suicidal ideas, hallucinations, behavioral problems, confusion, self-injury, dysphoric mood, decreased concentration and agitation. The patient is not nervous/anxious and is not hyperactive.        Objective:   Physical Exam  Vitals reviewed. Constitutional: He is oriented  to person, place, and time. He appears well-developed and well-nourished. No distress.  HENT:  Mouth/Throat: Oropharynx is clear and moist. No oropharyngeal exudate.  Eyes: Conjunctivae are normal. Right eye exhibits no discharge. Left eye exhibits no discharge. No scleral icterus.  Neck: Normal range of motion. Neck supple. No JVD present. No tracheal deviation present. No thyromegaly present.  Cardiovascular: Normal rate, regular rhythm, normal heart sounds and intact distal pulses.  Exam reveals no gallop and no friction rub.   No murmur heard. Pulmonary/Chest: Effort normal and breath sounds normal. No stridor. No respiratory distress. He has no wheezes. He has no rales. He exhibits no tenderness.  Abdominal: Soft. Bowel sounds are normal. He exhibits no distension and no mass. There is no tenderness. There is no rebound and no guarding.  Musculoskeletal: Normal range of motion. He exhibits no edema and no tenderness.  Lymphadenopathy:    He has no cervical adenopathy.  Neurological: He is oriented to person, place, and time. He displays normal reflexes. No cranial nerve deficit. He exhibits normal muscle tone. Coordination normal.  Skin: Skin is warm and dry. No rash noted. He is not diaphoretic. No erythema. No pallor.  Psychiatric: He has a normal mood and affect. His behavior is normal. Judgment and thought content normal.      Lab Results  Component Value Date   WBC 7.0 01/23/2011   HGB 16.2 01/23/2011   HCT 46.1 01/23/2011   PLT 197.0 01/23/2011   ALT 32 02/22/2009   AST 27 02/22/2009   NA 140 01/23/2011   K 3.9 01/23/2011   CL 101 01/23/2011   CREATININE 1.0 01/23/2011   BUN 17 01/23/2011   CO2 29 01/23/2011   TSH 0.90 02/22/2009   INR 1.3 01/13/2009   HGBA1C 5.4 02/22/2009      Assessment & Plan:

## 2011-06-27 NOTE — Assessment & Plan Note (Signed)
Will try nuvigil and will see if that helps, may consider a sleep study to test for apnea, he and his wife do not sleep together due to heavy snoring, also will taper him off of the SSRI to see if that helps his energy level

## 2011-06-27 NOTE — Assessment & Plan Note (Signed)
Will start allopurinol since he is not in the midst or a gout flare

## 2011-06-27 NOTE — Assessment & Plan Note (Signed)
No changes in his pain

## 2011-06-27 NOTE — Assessment & Plan Note (Signed)
He has stopped wellbutrin due to side effects and he was instructed in a slow taper of lexapro over the next 8 weeks

## 2011-07-18 ENCOUNTER — Other Ambulatory Visit: Payer: Self-pay | Admitting: Internal Medicine

## 2011-07-26 LAB — CBC
MCHC: 34.6
MCV: 92.7
RBC: 4.8
RDW: 12.3

## 2011-07-26 LAB — BASIC METABOLIC PANEL
CO2: 30
Chloride: 105
Creatinine, Ser: 1
GFR calc Af Amer: >60
Glucose, Bld: 101 — ABNORMAL HIGH

## 2011-07-26 LAB — DIFFERENTIAL
Basophils Absolute: 0
Basophils Relative: 0
Eosinophils Absolute: 0.1
Monocytes Absolute: 0.5
Monocytes Relative: 7
Neutrophils Relative %: 53

## 2011-08-26 ENCOUNTER — Ambulatory Visit: Payer: Managed Care, Other (non HMO) | Admitting: Internal Medicine

## 2011-08-26 DIAGNOSIS — Z0289 Encounter for other administrative examinations: Secondary | ICD-10-CM

## 2011-08-29 ENCOUNTER — Telehealth: Payer: Self-pay

## 2011-08-29 MED ORDER — ALPRAZOLAM 0.5 MG PO TABS: 0.5000 mg | ORAL_TABLET | Freq: Every evening | ORAL | Status: DC | PRN

## 2011-08-29 NOTE — Telephone Encounter (Signed)
Received refill request from pharmacy for xanax 0.5mg . Patient last seen 05/28/11 and med last refill 5/12. please advise if ok to refill Thanks

## 2011-08-29 NOTE — Telephone Encounter (Signed)
Pharmacy notified//rx called in per MD

## 2011-08-29 NOTE — Telephone Encounter (Signed)
yes

## 2011-10-24 ENCOUNTER — Other Ambulatory Visit: Payer: Self-pay | Admitting: Internal Medicine

## 2011-11-01 ENCOUNTER — Telehealth: Payer: Self-pay

## 2011-11-01 MED ORDER — HYDROCODONE-ACETAMINOPHEN 10-650 MG PO TABS: 1.0000 | ORAL_TABLET | Freq: Three times a day (TID) | ORAL | Status: DC | PRN

## 2011-11-01 NOTE — Telephone Encounter (Signed)
RX called in .

## 2011-11-01 NOTE — Telephone Encounter (Signed)
Please advise if ok to refill hydrocodone 10-650 for pt. RX last filled  05/02/11 #[id number]/6rf and pt last seen 06/27/11

## 2011-11-01 NOTE — Telephone Encounter (Signed)
yes

## 2012-02-13 ENCOUNTER — Other Ambulatory Visit: Payer: Self-pay

## 2012-02-13 MED ORDER — METOPROLOL SUCCINATE ER 100 MG PO TB24: ORAL_TABLET | ORAL | Status: DC

## 2012-04-29 ENCOUNTER — Telehealth: Payer: Self-pay

## 2012-04-29 NOTE — Telephone Encounter (Signed)
Pharmacy notified.

## 2012-04-29 NOTE — Telephone Encounter (Signed)
He needs to be seen

## 2012-04-29 NOTE — Telephone Encounter (Signed)
Please advise if ok to refill hydrocodone 10-650 tid prn, pt last seen 06/27/11 with no follow up indicated. Thanks

## 2012-05-13 ENCOUNTER — Other Ambulatory Visit (INDEPENDENT_AMBULATORY_CARE_PROVIDER_SITE_OTHER): Payer: Managed Care, Other (non HMO)

## 2012-05-13 ENCOUNTER — Ambulatory Visit (INDEPENDENT_AMBULATORY_CARE_PROVIDER_SITE_OTHER)
Admission: RE | Admit: 2012-05-13 | Discharge: 2012-05-13 | Disposition: A | Discharge status: Home / Self Care | Payer: Managed Care, Other (non HMO) | Source: Ambulatory Visit | Attending: Internal Medicine | Admitting: Internal Medicine

## 2012-05-13 ENCOUNTER — Encounter: Payer: Self-pay | Admitting: Internal Medicine

## 2012-05-13 ENCOUNTER — Ambulatory Visit (INDEPENDENT_AMBULATORY_CARE_PROVIDER_SITE_OTHER): Payer: Managed Care, Other (non HMO) | Admitting: Internal Medicine

## 2012-05-13 VITALS — BP 112/70 | HR 72 | Temp 97.4°F | Resp 16 | Wt 206.0 lb

## 2012-05-13 DIAGNOSIS — E118 Type 2 diabetes mellitus with unspecified complications: Secondary | ICD-10-CM | POA: Insufficient documentation

## 2012-05-13 DIAGNOSIS — M5412 Radiculopathy, cervical region: Secondary | ICD-10-CM

## 2012-05-13 DIAGNOSIS — M109 Gout, unspecified: Secondary | ICD-10-CM

## 2012-05-13 DIAGNOSIS — M542 Cervicalgia: Secondary | ICD-10-CM

## 2012-05-13 DIAGNOSIS — M546 Pain in thoracic spine: Secondary | ICD-10-CM

## 2012-05-13 DIAGNOSIS — M7712 Lateral epicondylitis, left elbow: Secondary | ICD-10-CM

## 2012-05-13 DIAGNOSIS — I10 Essential (primary) hypertension: Secondary | ICD-10-CM

## 2012-05-13 DIAGNOSIS — M7710 Lateral epicondylitis, unspecified elbow: Secondary | ICD-10-CM

## 2012-05-13 DIAGNOSIS — R7309 Other abnormal glucose: Secondary | ICD-10-CM

## 2012-05-13 DIAGNOSIS — G8929 Other chronic pain: Secondary | ICD-10-CM | POA: Insufficient documentation

## 2012-05-13 DIAGNOSIS — M549 Dorsalgia, unspecified: Secondary | ICD-10-CM | POA: Insufficient documentation

## 2012-05-13 LAB — COMPREHENSIVE METABOLIC PANEL
Albumin: 4.1 g/dL (ref 3.5–5.2)
Alkaline Phosphatase: 61 U/L (ref 39–117)
BUN: 11 mg/dL (ref 6–23)
Creatinine, Ser: 0.9 mg/dL (ref 0.4–1.5)
Glucose, Bld: 118 mg/dL — ABNORMAL HIGH (ref 70–99)
Total Bilirubin: 0.6 mg/dL (ref 0.3–1.2)

## 2012-05-13 LAB — URINALYSIS, ROUTINE W REFLEX MICROSCOPIC
Ketones, ur: NEGATIVE
Leukocytes, UA: NEGATIVE
Specific Gravity, Urine: 1.025 (ref 1.000–1.030)
Urobilinogen, UA: 0.2 (ref 0.0–1.0)

## 2012-05-13 LAB — CBC WITH DIFFERENTIAL/PLATELET
Basophils Relative: 0.2 % (ref 0.0–3.0)
Eosinophils Relative: 2.4 % (ref 0.0–5.0)
HCT: 47.5 % (ref 39.0–52.0)
Lymphs Abs: 1.9 10*3/uL (ref 0.7–4.0)
MCV: 95.5 fl (ref 78.0–100.0)
Monocytes Absolute: 0.4 10*3/uL (ref 0.1–1.0)
Neutro Abs: 3.8 10*3/uL (ref 1.4–7.7)
RBC: 4.97 Mil/uL (ref 4.22–5.81)
WBC: 6.3 10*3/uL (ref 4.5–10.5)

## 2012-05-13 LAB — LIPID PANEL: Triglycerides: 337 mg/dL — ABNORMAL HIGH (ref 0.0–149.0)

## 2012-05-13 LAB — TSH: TSH: 0.71 u[IU]/mL (ref 0.35–5.50)

## 2012-05-13 LAB — HEMOGLOBIN A1C: Hgb A1c MFr Bld: 5.5 % (ref 4.6–6.5)

## 2012-05-13 LAB — URIC ACID: Uric Acid, Serum: 7.8 mg/dL (ref 4.0–7.8)

## 2012-05-13 MED ORDER — HYDROCODONE-ACETAMINOPHEN 10-650 MG PO TABS: 1.0000 | ORAL_TABLET | Freq: Three times a day (TID) | ORAL | Status: DC | PRN

## 2012-05-13 NOTE — Assessment & Plan Note (Signed)
His BP is well controlled, I will check his lytes and renal function today 

## 2012-05-13 NOTE — Progress Notes (Signed)
Subjective:    Patient ID: [name], male    DOB: 1966/10/08, 46 y.o.   MRN: [id number]  Back Pain This is a new problem. The current episode started more than 1 month ago. The problem occurs intermittently. The problem is unchanged. The pain is present in the thoracic spine. The quality of the pain is described as aching. The pain does not radiate. The pain is at a severity of 6/10. The pain is mild. The pain is worse during the day. The symptoms are aggravated by bending and twisting. Stiffness is present all day. Pertinent negatives include no abdominal pain, bladder incontinence, bowel incontinence, chest pain, dysuria, fever, headaches, leg pain, numbness, paresis, paresthesias, pelvic pain, perianal numbness, tingling, weakness or weight loss. He has tried analgesics for the symptoms. The treatment provided moderate relief.      Review of Systems  Constitutional: Negative for fever, chills, weight loss, diaphoresis, activity change, appetite change, fatigue and unexpected weight change.  HENT: Positive for neck pain and neck stiffness. Negative for trouble swallowing and voice change.   Eyes: Negative.   Respiratory: Negative for apnea, cough, chest tightness, shortness of breath, wheezing and stridor.   Cardiovascular: Negative for chest pain, palpitations and leg swelling.  Gastrointestinal: Negative for nausea, vomiting, abdominal pain, diarrhea, constipation, blood in stool, abdominal distention and bowel incontinence.  Genitourinary: Negative.  Negative for bladder incontinence, dysuria and pelvic pain.  Musculoskeletal: Positive for back pain and arthralgias (left elbow pain for 3 months). Negative for myalgias, joint swelling and gait problem.  Skin: Negative for color change, pallor, rash and wound.  Neurological: Negative.  Negative for tingling, tremors, weakness, numbness, headaches and paresthesias.  Hematological: Negative for adenopathy. Does not bruise/bleed easily.    Psychiatric/Behavioral: Negative.        Objective:   Physical Exam  Vitals reviewed. Constitutional: He is oriented to person, place, and time. He appears well-developed and well-nourished. No distress.  HENT:  Head: Normocephalic and atraumatic.  Mouth/Throat: Oropharynx is clear and moist. No oropharyngeal exudate.  Eyes: Conjunctivae are normal. Right eye exhibits no discharge. Left eye exhibits no discharge. No scleral icterus.  Neck: Normal range of motion. Neck supple. No JVD present. No tracheal deviation present. No thyromegaly present.  Cardiovascular: Normal rate, regular rhythm, normal heart sounds and intact distal pulses.  Exam reveals no gallop and no friction rub.   No murmur heard. Pulmonary/Chest: Effort normal and breath sounds normal. No stridor. No respiratory distress. He has no wheezes. He has no rales. He exhibits no tenderness.  Abdominal: Soft. Bowel sounds are normal. He exhibits no distension and no mass. There is no tenderness. There is no rebound and no guarding.  Musculoskeletal: Normal range of motion. He exhibits no edema and no tenderness.       Left elbow: He exhibits normal range of motion, no swelling, no effusion, no deformity and no laceration. tenderness found. Lateral epicondyle tenderness noted. No radial head, no medial epicondyle and no olecranon process tenderness noted.       Cervical back: Normal. He exhibits normal range of motion, no tenderness, no bony tenderness, no swelling, no edema, no deformity, no laceration, no pain, no spasm and normal pulse.       Thoracic back: Normal. He exhibits normal range of motion, no tenderness, no bony tenderness, no swelling, no edema, no deformity, no laceration, no pain, no spasm and normal pulse.  Lymphadenopathy:    He has no cervical adenopathy.  Neurological: He is alert  and oriented to person, place, and time. He has normal strength and normal reflexes. He displays no atrophy, no tremor and normal  reflexes. No cranial nerve deficit or sensory deficit. He exhibits normal muscle tone. He displays a negative Romberg sign. He displays no seizure activity. Coordination and gait normal.  Skin: Skin is warm and dry. No rash noted. He is not diaphoretic. No erythema. No pallor.  Psychiatric: He has a normal mood and affect. His behavior is normal. Judgment and thought content normal.      Lab Results  Component Value Date   WBC 7.0 01/23/2011   HGB 16.2 01/23/2011   HCT 46.1 01/23/2011   PLT 197.0 01/23/2011   GLUCOSE 101* 01/23/2011   ALT 32 02/22/2009   AST 27 02/22/2009   NA 140 01/23/2011   K 3.9 01/23/2011   CL 101 01/23/2011   CREATININE 1.0 01/23/2011   BUN 17 01/23/2011   CO2 29 01/23/2011   TSH 0.90 02/22/2009   INR 1.3 01/13/2009   HGBA1C 5.4 02/22/2009      Assessment & Plan:

## 2012-05-13 NOTE — Assessment & Plan Note (Signed)
I will check a plain xray of this area today

## 2012-05-13 NOTE — Assessment & Plan Note (Signed)
He will try exercises at home for this

## 2012-05-13 NOTE — Assessment & Plan Note (Signed)
I will check his uric acid level and his renal function today

## 2012-05-13 NOTE — Assessment & Plan Note (Signed)
I will check his A1C to see if he has developed DM II 

## 2012-05-13 NOTE — Assessment & Plan Note (Signed)
Plain films today 

## 2012-05-13 NOTE — Assessment & Plan Note (Addendum)
I will check a plain film of his neck to see if there has been a progression of the disease, He will continue the current meds for pain

## 2012-05-13 NOTE — Patient Instructions (Addendum)
Hypertension As your heart beats, it forces blood through your arteries. This force is your blood pressure. If the pressure is too high, it is called hypertension (HTN) or high blood pressure. HTN is dangerous because you may have it and not know it. High blood pressure may mean that your heart has to work harder to pump blood. Your arteries may be narrow or stiff. The extra work puts you at risk for heart disease, stroke, and other problems.  Blood pressure consists of two numbers, a higher number over a lower, 110/72, for example. It is stated as "110 over 72." The ideal is below 120 for the top number (systolic) and under 80 for the bottom (diastolic). Write down your blood pressure today. You should pay close attention to your blood pressure if you have certain conditions such as:  Heart failure.   Prior heart attack.   Diabetes   Chronic kidney disease.   Prior stroke.   Multiple risk factors for heart disease.  To see if you have HTN, your blood pressure should be measured while you are seated with your arm held at the level of the heart. It should be measured at least twice. A one-time elevated blood pressure reading (especially in the Emergency Department) does not mean that you need treatment. There may be conditions in which the blood pressure is different between your right and left arms. It is important to see your caregiver soon for a recheck. Most people have essential hypertension which means that there is not a specific cause. This type of high blood pressure may be lowered by changing lifestyle factors such as:  Stress.   Smoking.   Lack of exercise.   Excessive weight.   Drug/tobacco/alcohol use.   Eating less salt.  Most people do not have symptoms from high blood pressure until it has caused damage to the body. Effective treatment can often prevent, delay or reduce that damage. TREATMENT  When a cause has been identified, treatment for high blood pressure is  directed at the cause. There are a large number of medications to treat HTN. These fall into several categories, and your caregiver will help you select the medicines that are best for you. Medications may have side effects. You should review side effects with your caregiver. If your blood pressure stays high after you have made lifestyle changes or started on medicines,   Your medication(s) may need to be changed.   Other problems may need to be addressed.   Be certain you understand your prescriptions, and know how and when to take your medicine.   Be sure to follow up with your caregiver within the time frame advised (usually within two weeks) to have your blood pressure rechecked and to review your medications.   If you are taking more than one medicine to lower your blood pressure, make sure you know how and at what times they should be taken. Taking two medicines at the same time can result in blood pressure that is too low.  SEEK IMMEDIATE MEDICAL CARE IF:  You develop a severe headache, blurred or changing vision, or confusion.   You have unusual weakness or numbness, or a faint feeling.   You have severe chest or abdominal pain, vomiting, or breathing problems.  MAKE SURE YOU:   Understand these instructions.   Will watch your condition.   Will get help right away if you are not doing well or get worse.  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 Although the condition is often called tennis or golfer's elbow, it is caused by any repetitive action performed by your elbow. HOME CARE INSTRUCTIONS  If the condition has been short lived, rest may be the only treatment  required. Using your opposite hand or arm to perform the task may help. Even changing your grip may help rest the extremity. These may even prevent the condition from recurring.   Longer standing problems, however, will often be relieved faster by:   Using anti-inflammatory agents.   Applying ice packs for 30 minutes at the end of the working day, at bed time, or when activities are finished.   Your caregiver may also have you wear a splint or sling. This will allow the inflamed tendon to heal.  At times, steroid injections aided with a local anesthetic will be required along with splinting for 1 to 2 weeks. Two to three steroid injections will often solve the problem. In some long standing cases, the inflamed tendon does not respond to conservative (non-surgical) therapy. Then surgery may be required to repair it. MAKE SURE YOU:   Understand these instructions.   Will watch your condition.   Will get help right away if you are not doing well or get worse.  Document Released: 09/30/2005 Document Revised: 09/19/2011 Document Reviewed: 05/18/2008 [facility] Patient Information 2012 [place], [place].

## 2012-07-24 ENCOUNTER — Other Ambulatory Visit: Payer: Self-pay | Admitting: Internal Medicine

## 2012-09-17 ENCOUNTER — Ambulatory Visit (INDEPENDENT_AMBULATORY_CARE_PROVIDER_SITE_OTHER): Payer: Managed Care, Other (non HMO) | Admitting: Internal Medicine

## 2012-09-17 ENCOUNTER — Encounter: Payer: Self-pay | Admitting: Internal Medicine

## 2012-09-17 ENCOUNTER — Other Ambulatory Visit (INDEPENDENT_AMBULATORY_CARE_PROVIDER_SITE_OTHER): Payer: Managed Care, Other (non HMO)

## 2012-09-17 VITALS — BP 108/60 | HR 70 | Temp 97.6°F | Resp 16 | Wt 199.2 lb

## 2012-09-17 DIAGNOSIS — M542 Cervicalgia: Secondary | ICD-10-CM

## 2012-09-17 DIAGNOSIS — G894 Chronic pain syndrome: Secondary | ICD-10-CM | POA: Insufficient documentation

## 2012-09-17 DIAGNOSIS — R7309 Other abnormal glucose: Secondary | ICD-10-CM

## 2012-09-17 DIAGNOSIS — R5381 Other malaise: Secondary | ICD-10-CM

## 2012-09-17 DIAGNOSIS — F528 Other sexual dysfunction not due to a substance or known physiological condition: Secondary | ICD-10-CM

## 2012-09-17 DIAGNOSIS — I10 Essential (primary) hypertension: Secondary | ICD-10-CM

## 2012-09-17 DIAGNOSIS — F329 Major depressive disorder, single episode, unspecified: Secondary | ICD-10-CM

## 2012-09-17 DIAGNOSIS — G8929 Other chronic pain: Secondary | ICD-10-CM

## 2012-09-17 DIAGNOSIS — F3289 Other specified depressive episodes: Secondary | ICD-10-CM

## 2012-09-17 DIAGNOSIS — R5383 Other fatigue: Secondary | ICD-10-CM

## 2012-09-17 LAB — COMPREHENSIVE METABOLIC PANEL
Albumin: 4.2 g/dL (ref 3.5–5.2)
Alkaline Phosphatase: 68 U/L (ref 39–117)
BUN: 14 mg/dL (ref 6–23)
CO2: 34 mEq/L — ABNORMAL HIGH (ref 19–32)
Calcium: 9.2 mg/dL (ref 8.4–10.5)
Chloride: 101 mEq/L (ref 96–112)
GFR: 90.57 mL/min (ref >60.00)
Glucose, Bld: 88 mg/dL (ref 70–99)
Potassium: 4.5 mEq/L (ref 3.5–5.1)
Sodium: 141 mEq/L (ref 135–145)
Total Protein: 7.4 g/dL (ref 6.0–8.3)

## 2012-09-17 LAB — CBC WITH DIFFERENTIAL/PLATELET
Basophils Relative: 0.2 % (ref 0.0–3.0)
Eosinophils Relative: 2 % (ref 0.0–5.0)
Lymphocytes Relative: 30.9 % (ref 12.0–46.0)
MCV: 96 fl (ref 78.0–100.0)
Monocytes Absolute: 0.5 10*3/uL (ref 0.1–1.0)
Monocytes Relative: 8.3 % (ref 3.0–12.0)
Neutrophils Relative %: 58.6 % (ref 43.0–77.0)
Platelets: 224 10*3/uL (ref 150.0–400.0)
RBC: 5.09 Mil/uL (ref 4.22–5.81)
WBC: 6.2 10*3/uL (ref 4.5–10.5)

## 2012-09-17 LAB — URINALYSIS, ROUTINE W REFLEX MICROSCOPIC
Bilirubin Urine: NEGATIVE
Hgb urine dipstick: NEGATIVE
Ketones, ur: NEGATIVE
Leukocytes, UA: NEGATIVE
Nitrite: NEGATIVE
pH: 6 (ref 5.0–8.0)

## 2012-09-17 LAB — TSH: TSH: 0.72 u[IU]/mL (ref 0.35–5.50)

## 2012-09-17 MED ORDER — TADALAFIL 20 MG PO TABS: 20.0000 mg | ORAL_TABLET | Freq: Every day | ORAL | Status: DC | PRN

## 2012-09-17 NOTE — Patient Instructions (Signed)
Erectile Dysfunction  Erectile dysfunction (ED) is the inability to get a good enough erection to have sexual intercourse. ED may involve:  · Inability to get an erection.  · Lack of enough hardness to allow penetration.  · Loss of the erection before sex is finished.  · Premature ejaculation.  · Any combination of these problems if they occur more than 25% of the time.  CAUSES  · Certain drugs, such as:  · Pain relievers.  · Antihistamines.  · Antidepressants.  · Blood pressure medicines.  · Water pills.  · Ulcer medicines.  · Muscle relaxants.  · Illegal drugs.  · Excessive drinking.  · Psychological causes, such as:  · Anxiety.  · Depression.  · Sadness.  · Exhaustion.  · Performance fear.  · Stress.  · Physical causes, such as:  · Artery problems. This may include diabetes, smoking, liver disease, or atherosclerosis.  · High blood pressure.  · Hormonal problems, such as low testosterone.  · Obesity.  · Nerve problems. This may include back or pelvic injuries, diabetes, multiple sclerosis, Parkinson's disease, or some surgeries.  SYMPTOMS  · Inability to get an erection.  · Lack of enough hardness to allow penetration.  · Loss of the erection before sex is finished.  · Premature ejaculation.  · Normal erections at some times, but with frequent unsatisfactory episodes.  · Orgasms that are not satisfactory in sensation or frequency.  · Low sexual satisfaction in either partner because of erection problems.  · A curved penis occurring with erection. The curve may cause pain or may be too curved to allow for intercourse.  · Never having nighttime erections.  DIAGNOSIS  Your caregiver can often diagnose this condition by:  · Performing a physical exam to find other diseases or specific problems with the penis.  · Asking you detailed questions about the problem.  · Performing blood tests to check for diabetes or to measure hormone levels.  · Performing urine tests to find other underlying health  conditions.  · Performing an ultrasound to check for scarring.  · Performing a test to check blood flow to the penis.  · Doing a sleep study at home to measure nighttime erections.  TREATMENT   · You may be prescribed medicines by mouth.  · You may be given medicine injections into the penis.  · You may be prescribed a vacuum pump with a ring.  · Penile implant surgery may be performed. You may receive:  · An inflatable implant.  · A semi-rigid implant.  · Blood vessel surgery may be performed.  HOME CARE INSTRUCTIONS  · Take all medicine as directed by your caregiver. Do not take any other medicines without talking to your caregiver first.  · Follow your caregiver's directions for specific treatments as prescribed.  · Follow up with your caregiver as directed.  Document Released: 09/27/2000 Document Revised: 12/23/2011 Document Reviewed: 01/20/2011  [employer]® Patient Information ©2013 [employer].

## 2012-09-17 NOTE — Assessment & Plan Note (Signed)
Will continue the current meds for pain 

## 2012-09-17 NOTE — Assessment & Plan Note (Signed)
I will check his labs to see if he has a low T, anemia, thyroid disease, renal/liver disease

## 2012-09-17 NOTE — Assessment & Plan Note (Signed)
Restart cialis Check labs today to look for secondary causes

## 2012-09-17 NOTE — Assessment & Plan Note (Signed)
I will check his A1C to see if he has developed DM II 

## 2012-09-17 NOTE — Assessment & Plan Note (Signed)
His BP is well controlled 

## 2012-09-17 NOTE — Assessment & Plan Note (Signed)
He has been seeing Dr. Evelene Croon and is taking "a lot" of adderall

## 2012-09-17 NOTE — Progress Notes (Signed)
Subjective:    Patient ID: [name], male    DOB: 27-Feb-1966, 46 y.o.   MRN: [id number]  Neck Pain  This is a chronic problem. The current episode started more than 1 year ago. The problem occurs intermittently. The problem has been unchanged. The pain is associated with nothing. The pain is present in the left side. The quality of the pain is described as aching. The pain is at a severity of 3/10. The pain is mild. The symptoms are aggravated by position. The pain is same all the time. Pertinent negatives include no chest pain, fever, headaches, leg pain, numbness, pain with swallowing, paresis, photophobia, syncope, tingling, trouble swallowing, visual change, weakness or weight loss. Treatments tried: lorcet. The treatment provided significant relief.  Erectile Dysfunction This is a chronic problem. The current episode started more than 1 year ago. The problem is unchanged. The nature of his difficulty is achieving erection, maintaining erection and penetration. Non-physiologic factors contributing to erectile dysfunction are performance anxiety. He reports no anxiety or decreased libido. He reports his erection duration to be 1 to 5 minutes. Irritative symptoms do not include frequency, nocturia or urgency. Obstructive symptoms do not include dribbling, incomplete emptying, an intermittent stream, a slower stream, straining or a weak stream. Pertinent negatives include no chills, dysuria, genital pain, hematuria, hesitancy or inability to urinate. The symptoms are aggravated by stress. Past treatments include tadalafil. The treatment provided significant relief. He has been using treatment for 6 to 12 months. He has had no adverse reactions caused by medications. Risk factors include hypertension.      Review of Systems  Constitutional: Positive for fatigue. Negative for fever, chills, weight loss, diaphoresis, activity change, appetite change and unexpected weight change.  HENT: Positive for  neck pain. Negative for sore throat, facial swelling, trouble swallowing and voice change.   Eyes: Negative.  Negative for photophobia.  Respiratory: Negative for apnea, cough, choking, chest tightness, shortness of breath, wheezing and stridor.   Cardiovascular: Negative for chest pain, palpitations, leg swelling and syncope.  Gastrointestinal: Negative for nausea, vomiting, abdominal pain, diarrhea, constipation and blood in stool.  Genitourinary: Negative.  Negative for dysuria, hesitancy, urgency, frequency, hematuria, decreased libido, incomplete emptying and nocturia.  Musculoskeletal: Negative for myalgias, back pain, joint swelling, arthralgias and gait problem.  Skin: Negative for color change, pallor, rash and wound.  Neurological: Negative for dizziness, tingling, tremors, seizures, syncope, facial asymmetry, speech difficulty, weakness, light-headedness, numbness and headaches.  Hematological: Negative for adenopathy. Does not bruise/bleed easily.  Psychiatric/Behavioral: Negative.  Negative for hallucinations, confusion, sleep disturbance, dysphoric mood, decreased concentration and agitation. The patient is not nervous/anxious.        Objective:   Physical Exam  Vitals reviewed. Constitutional: He is oriented to person, place, and time. He appears well-developed and well-nourished. No distress.  HENT:  Head: Normocephalic and atraumatic.  Mouth/Throat: Oropharynx is clear and moist. No oropharyngeal exudate.  Eyes: Conjunctivae normal are normal. Right eye exhibits no discharge. Left eye exhibits no discharge. No scleral icterus.  Neck: Normal range of motion. Neck supple. No JVD present. No tracheal deviation present. No thyromegaly present.  Cardiovascular: Normal rate, regular rhythm, normal heart sounds and intact distal pulses.  Exam reveals no gallop and no friction rub.   No murmur heard. Pulmonary/Chest: Effort normal and breath sounds normal. No stridor. No respiratory  distress. He has no wheezes. He has no rales. He exhibits no tenderness.  Abdominal: Soft. Bowel sounds are normal. He exhibits no distension  and no mass. There is no tenderness. There is no rebound and no guarding.  Musculoskeletal: Normal range of motion. He exhibits no edema and no tenderness.       Cervical back: Normal. He exhibits normal range of motion, no tenderness, no bony tenderness, no swelling, no edema, no deformity, no laceration, no pain, no spasm and normal pulse.  Lymphadenopathy:    He has no cervical adenopathy.  Neurological: He is alert and oriented to person, place, and time. He has normal reflexes. He displays no atrophy, no tremor and normal reflexes. No cranial nerve deficit or sensory deficit. He exhibits normal muscle tone. He displays no seizure activity. Coordination and gait normal.  Skin: Skin is warm and dry. No rash noted. He is not diaphoretic. No erythema. No pallor.  Psychiatric: He has a normal mood and affect. His speech is normal and behavior is normal. Judgment and thought content normal. Cognition and memory are normal.     Lab Results  Component Value Date   WBC 6.3 05/13/2012   HGB 16.2 05/13/2012   HCT 47.5 05/13/2012   PLT 208.0 05/13/2012   GLUCOSE 118* 05/13/2012   CHOL 184 05/13/2012   TRIG 337.0* 05/13/2012   HDL 41.70 05/13/2012   LDLDIRECT 94.3 05/13/2012   ALT 25 05/13/2012   AST 21 05/13/2012   NA 142 05/13/2012   K 4.5 05/13/2012   CL 102 05/13/2012   CREATININE 0.9 05/13/2012   BUN 11 05/13/2012   CO2 33* 05/13/2012   TSH 0.71 05/13/2012   INR 1.3 01/13/2009   HGBA1C 5.5 05/13/2012      Assessment & Plan:

## 2012-09-17 NOTE — Assessment & Plan Note (Signed)
I will check his UDS to see if he has been compliant with listed meds and to screen for illicit drug abuse

## 2012-09-18 ENCOUNTER — Encounter: Payer: Self-pay | Admitting: Internal Medicine

## 2012-09-18 LAB — TESTOSTERONE, FREE, TOTAL, SHBG
Sex Hormone Binding: 31 nmol/L (ref 13–71)
Testosterone-% Free: 2 % (ref 1.6–2.9)
Testosterone: 265.37 ng/dL — ABNORMAL LOW (ref 300–890)

## 2012-09-18 LAB — DRUGS OF ABUSE SCREEN W/O ALC, ROUTINE URINE
Amphetamine Screen, Ur: POSITIVE — AB
Barbiturate Quant, Ur: NEGATIVE
Marijuana Metabolite: NEGATIVE
Methadone: NEGATIVE
Propoxyphene: NEGATIVE

## 2012-09-18 LAB — HEMOGLOBIN A1C: Hgb A1c MFr Bld: 5.5 % (ref 4.6–6.5)

## 2012-09-21 LAB — BENZODIAZEPINES (GC/LC/MS), URINE
Alprazolam (GC/LC/MS), ur confirm: 74 ng/mL
Alprazolam metabolite (GC/LC/MS), ur confirm: 300 ng/mL
Flurazepam metabolite (GC/LC/MS), ur confirm: NEGATIVE ng/mL
Midazolam (GC/LC/MS), ur confirm: NEGATIVE ng/mL
Oxazepam (GC/LC/MS), ur confirm: NEGATIVE ng/mL

## 2012-09-21 LAB — OPIATES/OPIOIDS (LC/MS-MS)
Hydromorphone: 155 ng/mL
Morphine Urine: NEGATIVE ng/mL
Norhydrocodone, Ur: 5124 ng/mL
Noroxycodone, Ur: NEGATIVE ng/mL
Oxycodone, ur: NEGATIVE ng/mL
Oxymorphone: NEGATIVE ng/mL

## 2012-09-21 LAB — AMPHETAMINES (GC/LC/MS), URINE
MDEA GC/MS Conf: NEGATIVE ng/mL
Methamphetamine Quant, Ur: NEGATIVE ng/mL

## 2012-09-23 ENCOUNTER — Telehealth: Payer: Self-pay

## 2012-09-23 NOTE — Telephone Encounter (Signed)
Received fax from insurance stating Cialis not covered without a PA. Called [employer] at [phone]), initiated PA over the phone. Per insurance, PA pending clinical review which can take up to 72 hrs, pharmacy notified.

## 2012-10-05 ENCOUNTER — Other Ambulatory Visit: Payer: Self-pay

## 2012-10-05 MED ORDER — METOPROLOL SUCCINATE ER 100 MG PO TB24: ORAL_TABLET | ORAL | Status: DC

## 2012-11-30 ENCOUNTER — Telehealth: Payer: Self-pay

## 2012-11-30 DIAGNOSIS — M5412 Radiculopathy, cervical region: Secondary | ICD-10-CM

## 2012-11-30 DIAGNOSIS — M546 Pain in thoracic spine: Secondary | ICD-10-CM

## 2012-11-30 DIAGNOSIS — M542 Cervicalgia: Secondary | ICD-10-CM

## 2012-11-30 MED ORDER — HYDROCODONE-ACETAMINOPHEN 10-650 MG PO TABS: 1.0000 | ORAL_TABLET | Freq: Three times a day (TID) | ORAL | Status: DC | PRN

## 2012-11-30 NOTE — Telephone Encounter (Signed)
OK to fill this prescription with additional refills x0 Thank you!  

## 2012-11-30 NOTE — Telephone Encounter (Signed)
Pharmacy notified via returned fax 

## 2012-11-30 NOTE — Telephone Encounter (Signed)
Please advise if ok to refill hydrocodone 10-650 TID prn. Patient last seen 09/17/12 and medication last filled 05/13/12 #[id number]/rf.

## 2012-12-08 ENCOUNTER — Telehealth: Payer: Self-pay | Admitting: *Deleted

## 2012-12-08 NOTE — Telephone Encounter (Signed)
Left msg on triage requesting status on hydrocodone refill. Per chart rx was [facility] called pharmacy spoke with amber she stated they never filled any med for pt. Request was not sent from them. Sending msg to [facility] to review since was fax by her,,,,/lmb

## 2012-12-09 ENCOUNTER — Telehealth: Payer: Self-pay | Admitting: Internal Medicine

## 2012-12-09 MED ORDER — HYDROCODONE-ACETAMINOPHEN 10-325 MG PO TABS: 1.0000 | ORAL_TABLET | Freq: Three times a day (TID) | ORAL | Status: DC | PRN

## 2012-12-09 NOTE — Telephone Encounter (Signed)
Patients wife is calling stating that [facility]  [address] says that they never received the refill, now the patient is wanting this [facility] [place], requesting we [facility] to [facility] [place]

## 2012-12-09 NOTE — Telephone Encounter (Signed)
Per pharmacy due to acetaminophen warning, medication must be decreased to 10-325. New dose called to [facility] [place] at [phone] and med list updated

## 2012-12-09 NOTE — Telephone Encounter (Signed)
[facility]

## 2013-01-06 ENCOUNTER — Encounter: Payer: Self-pay | Admitting: Internal Medicine

## 2013-01-06 ENCOUNTER — Ambulatory Visit (INDEPENDENT_AMBULATORY_CARE_PROVIDER_SITE_OTHER): Payer: Managed Care, Other (non HMO) | Admitting: Internal Medicine

## 2013-01-06 VITALS — BP 120/84 | HR 130 | Temp 97.8°F | Resp 16 | Wt 202.8 lb

## 2013-01-06 DIAGNOSIS — R000 Tachycardia, unspecified: Secondary | ICD-10-CM | POA: Insufficient documentation

## 2013-01-06 DIAGNOSIS — I10 Essential (primary) hypertension: Secondary | ICD-10-CM

## 2013-01-06 DIAGNOSIS — F528 Other sexual dysfunction not due to a substance or known physiological condition: Secondary | ICD-10-CM

## 2013-01-06 DIAGNOSIS — E291 Testicular hypofunction: Secondary | ICD-10-CM | POA: Insufficient documentation

## 2013-01-06 DIAGNOSIS — M109 Gout, unspecified: Secondary | ICD-10-CM

## 2013-01-06 MED ORDER — COLCHICINE 0.6 MG PO TABS: 0.6000 mg | ORAL_TABLET | Freq: Two times a day (BID) | ORAL | Status: DC

## 2013-01-06 MED ORDER — TESTOSTERONE 2.5 MG/24HR TD PT24: 1.0000 | MEDICATED_PATCH | Freq: Every day | TRANSDERMAL | Status: DC

## 2013-01-06 NOTE — Patient Instructions (Signed)
Testosterone skin patches What is this medicine? TESTOSTERONE (tes TOS ter one) is the main male hormone. It supports normal male traits such as muscle growth, facial hair, and deep voice. This skin patch is used in males to treat low testosterone levels. This medicine may be used for other purposes; ask your health care provider or pharmacist if you have questions. What should I tell my health care provider before I take this medicine? They need to know if you have any of these conditions: -breast cancer -diabetes -heart disease -kidney disease -liver disease -lung disease -prostate cancer, enlargement -an unusual or allergic reaction to testosterone, adhesives, other medicines, foods, dyes, or preservatives -pregnant or trying to get pregnant -breast-feeding How should I use this medicine? Apply these patches once daily, at the same time every evening. Follow the directions on the prescription label. Open the pouch and remove the patch. Remove the protective liner and silver disk from the patch. Apply to a clean, dry area of intact skin on the back, abdomen, upper arms, or thighs. Do not apply to a bony area like the hip or shoulder or an area that might receive a lot of pressure while sitting or sleeping. Do not apply to the genitals or scrotum. Remove and replace the patch as directed every 24 hours, applying a new patch to a new site. When you remove a patch, do not place another patch on the same spot for at least 7 days. The patch may be worn during sex, showering, or swimming. Excessive sweating or strenuous exercise might cause the patch to loosen or fall off. Talk to your pediatrician regarding the use of this medicine in children. While this skin patch has been used in males as young as 47 years of age, precautions may apply. Overdosage: If you think you have taken too much of this medicine contact a poison control center or emergency room at once. NOTE: This medicine is only for you.  Do not share this medicine with others. What if I miss a dose? Try not to miss a dose. If the patch becomes loose, simply smooth it down again around the edges. If a patch is forgotten or falls off before noon, apply a fresh patch and wear it until you get back on your normal schedule that evening. If a patch is forgotten or falls off in the afternoon or later in the day, do not use a new patch until it is time for your next evening dose. Do not use double or extra doses. What may interact with this medicine? -medicines for diabetes -medicines that treat or prevent blood clots like warfarin -oxyphenbutazone -propranolol -steroid medicines like prednisone or cortisone This list may not describe all possible interactions. Give your health care provider a list of all the medicines, herbs, non-prescription drugs, or dietary supplements you use. Also tell them if you smoke, drink alcohol, or use illegal drugs. Some items may interact with your medicine. What should I watch for while using this medicine? Visit your doctor or health care professional for regular checks on your progress. They will need to check the level of testosterone in your blood. This medicine may affect blood sugar levels. If you have diabetes, check with your doctor or health care professional before you change your diet or the dose of your diabetic medicine. This drug is banned from use in athletes by most athletic organizations. If you are going to have a magnetic resonance imaging (MRI) procedure, tell your MRI technician if you have  this patch on your body. It must be removed before a MRI. What side effects may I notice from receiving this medicine? Side effects that you should report to your doctor or health care professional as soon as possible: -allergic reactions like skin rash, itching or hives, swelling of the face, lips, or tongue -breast enlargement -breathing problems -changes in mood, especially anger, depression, or  rage -dark urine -general ill feeling or flu-like symptoms -light-colored stools -loss of appetite, nausea -nausea, vomiting -right upper belly pain -stomach pain -swelling of ankles -too frequent or persistent erections -trouble passing urine or change in the amount of urine -unusually weak or tired -yellowing of the eyes or skin Side effects that usually do not require medical attention (report to your doctor or health care professional if they continue or are bothersome): -acne -change in sex drive or performance -hair loss -headache -mild redness of the skin under the patch This list may not describe all possible side effects. Call your doctor for medical advice about side effects. You may report side effects to FDA at 1-800-FDA-1088. Where should I keep my medicine? Keep out of the reach of children. This medicine can be abused. Keep your medicine in a safe place to protect it from theft. Do not share this medicine with anyone. Selling or giving away this medicine is dangerous and against the law. Store at room temperature between 15 and 30 degrees C (59 and 86 degrees F). Keep each patch in its sealed pouch until ready to use. Protect from heat. Do not use a patch that appears to be damaged. Throw away any unused medicine after the expiration date. NOTE: This sheet is a summary. It may not cover all possible information. If you have questions about this medicine, talk to your doctor, pharmacist, or health care provider.  2012, [employer]. (09/13/2008 11:01:55 AM)

## 2013-01-07 ENCOUNTER — Telehealth: Payer: Self-pay

## 2013-01-07 MED ORDER — TESTOSTERONE 2 MG/24HR TD PT24: 1.0000 | MEDICATED_PATCH | Freq: Every day | TRANSDERMAL | Status: DC

## 2013-01-07 NOTE — Assessment & Plan Note (Signed)
His BP is well controlled 

## 2013-01-07 NOTE — Assessment & Plan Note (Signed)
Continue cialis Start androderm patch

## 2013-01-07 NOTE — Progress Notes (Signed)
Subjective:    Patient ID: [name], male    DOB: [birth year], 47 y.o.   MRN: [id number]  Erectile Dysfunction This is a chronic problem. The current episode started more than 1 year ago. The problem is unchanged. The nature of his difficulty is achieving erection, maintaining erection and penetration. Non-physiologic factors contributing to erectile dysfunction are anxiety, a decreased libido and performance anxiety. He reports his erection duration to be less than 1 minute. Irritative symptoms do not include frequency, nocturia or urgency. Obstructive symptoms do not include dribbling, incomplete emptying, an intermittent stream, a slower stream, straining or a weak stream. Pertinent negatives include no chills, dysuria, genital pain, hematuria, hesitancy or inability to urinate. The symptoms are aggravated by medications and stress. Past treatments include tadalafil. The treatment provided significant relief. He has been using treatment for 6 to 12 months. He has had no adverse reactions caused by medications. Risk factors include hypertension.      Review of Systems  Constitutional: Positive for fatigue. Negative for fever, chills, diaphoresis, activity change, appetite change and unexpected weight change.  HENT: Positive for neck pain (chronic,unchanged). Negative for facial swelling and neck stiffness.   Eyes: Negative.   Respiratory: Negative.  Negative for cough, chest tightness, shortness of breath, wheezing and stridor.   Cardiovascular: Negative.  Negative for chest pain, palpitations and leg swelling.  Gastrointestinal: Negative.  Negative for nausea, vomiting, abdominal pain, diarrhea and constipation.  Endocrine: Negative.   Genitourinary: Positive for decreased libido. Negative for dysuria, hesitancy, urgency, frequency, hematuria, incomplete emptying and nocturia.  Musculoskeletal: Negative for myalgias, back pain, joint swelling, arthralgias and gait problem.  Skin:  Negative.  Negative for color change, pallor, rash and wound.  Allergic/Immunologic: Negative.   Neurological: Negative.  Negative for dizziness, tremors, seizures, syncope, facial asymmetry, speech difficulty, weakness, light-headedness, numbness and headaches.  Hematological: Negative for adenopathy. Does not bruise/bleed easily.  Psychiatric/Behavioral: Negative.        Objective:   Physical Exam  Vitals reviewed. Constitutional: He is oriented to person, place, and time. He appears well-developed and well-nourished.  Non-toxic appearance. He does not have a sickly appearance. He does not appear ill. No distress.  HENT:  Head: Normocephalic and atraumatic.  Mouth/Throat: Oropharynx is clear and moist. No oropharyngeal exudate.  Eyes: Conjunctivae and EOM are normal. Pupils are equal, round, and reactive to light. Right eye exhibits no discharge. Left eye exhibits no discharge. No scleral icterus.  Neck: Normal range of motion. Neck supple. No JVD present. No tracheal deviation present. No thyromegaly present.  Cardiovascular: Normal rate, regular rhythm, normal heart sounds and intact distal pulses.  Exam reveals no gallop and no friction rub.   No murmur heard. Pulmonary/Chest: Effort normal and breath sounds normal. No stridor. No respiratory distress. He has no wheezes. He has no rales. He exhibits no tenderness.  Abdominal: Soft. Bowel sounds are normal. He exhibits no distension and no mass. There is no tenderness. There is no rebound and no guarding.  Musculoskeletal: Normal range of motion. He exhibits no edema and no tenderness.  Lymphadenopathy:    He has no cervical adenopathy.  Neurological: He is oriented to person, place, and time.  Skin: Skin is warm and dry. No rash noted. He is not diaphoretic. No erythema. No pallor.  Psychiatric: He has a normal mood and affect. His speech is normal and behavior is normal. Judgment and thought content normal. His mood appears not  anxious. His affect is not angry, not blunt, not  labile and not inappropriate. Cognition and memory are normal. He does not exhibit a depressed mood.     Lab Results  Component Value Date   WBC 6.2 09/17/2012   HGB 16.5 09/17/2012   HCT 48.9 09/17/2012   PLT 224.0 09/17/2012   GLUCOSE 88 09/17/2012   CHOL 184 05/13/2012   TRIG 337.0* 05/13/2012   HDL 41.70 05/13/2012   LDLDIRECT 94.3 05/13/2012   ALT 26 09/17/2012   AST 24 09/17/2012   NA 141 09/17/2012   K 4.5 09/17/2012   CL 101 09/17/2012   CREATININE 1.0 09/17/2012   BUN 14 09/17/2012   CO2 34* 09/17/2012   TSH 0.72 09/17/2012   INR 1.3 01/13/2009   HGBA1C 5.5 09/17/2012       Assessment & Plan:

## 2013-01-07 NOTE — Assessment & Plan Note (Signed)
He will start the androderm patch

## 2013-01-07 NOTE — Assessment & Plan Note (Signed)
This is caused by his use of amphetamines - I have advised of the danger of long term tachycardia and have asked him to stop taking amphetamines

## 2013-01-07 NOTE — Telephone Encounter (Signed)
Pharmacy called advising that andro patches only come in 2 or 4 mcg. Need to clarify which one to dispense. Per MD, ok to dispense patch.

## 2013-01-15 ENCOUNTER — Other Ambulatory Visit: Payer: Self-pay | Admitting: Internal Medicine

## 2013-01-27 ENCOUNTER — Ambulatory Visit (INDEPENDENT_AMBULATORY_CARE_PROVIDER_SITE_OTHER): Payer: Managed Care, Other (non HMO) | Admitting: Internal Medicine

## 2013-01-27 ENCOUNTER — Encounter: Payer: Self-pay | Admitting: Internal Medicine

## 2013-01-27 ENCOUNTER — Ambulatory Visit (INDEPENDENT_AMBULATORY_CARE_PROVIDER_SITE_OTHER)
Admission: RE | Admit: 2013-01-27 | Discharge: 2013-01-27 | Disposition: A | Discharge status: Home / Self Care | Payer: Managed Care, Other (non HMO) | Source: Ambulatory Visit | Attending: Internal Medicine | Admitting: Internal Medicine

## 2013-01-27 VITALS — BP 110/72 | HR 65 | Temp 97.4°F | Resp 16 | Wt 210.0 lb

## 2013-01-27 DIAGNOSIS — M25559 Pain in unspecified hip: Secondary | ICD-10-CM

## 2013-01-27 DIAGNOSIS — M545 Low back pain, unspecified: Secondary | ICD-10-CM

## 2013-01-27 DIAGNOSIS — M79605 Pain in left leg: Secondary | ICD-10-CM

## 2013-01-27 DIAGNOSIS — M25552 Pain in left hip: Secondary | ICD-10-CM

## 2013-01-27 DIAGNOSIS — I10 Essential (primary) hypertension: Secondary | ICD-10-CM

## 2013-01-27 NOTE — Progress Notes (Signed)
Subjective:    Patient ID: [name], male    DOB: 1965/12/20, 47 y.o.   MRN: [id number]  Back Pain This is a chronic problem. The current episode started more than 1 year ago. The problem occurs intermittently. The problem has been gradually worsening since onset. The pain is present in the lumbar spine. The quality of the pain is described as aching. The pain does not radiate. The pain is at a severity of 3/10. The pain is moderate. The pain is worse during the day. The symptoms are aggravated by standing. Pertinent negatives include no abdominal pain, bladder incontinence, bowel incontinence, chest pain, dysuria, fever, headaches, leg pain, numbness, paresis, paresthesias, pelvic pain, perianal numbness, tingling, weakness or weight loss. He has tried analgesics for the symptoms. The treatment provided moderate relief.      Review of Systems  Constitutional: Negative.  Negative for fever and weight loss.  HENT: Negative.   Eyes: Negative.   Respiratory: Negative.   Cardiovascular: Negative.  Negative for chest pain, palpitations and leg swelling.  Gastrointestinal: Negative.  Negative for nausea, vomiting, abdominal pain, diarrhea, constipation and bowel incontinence.  Endocrine: Negative.   Genitourinary: Negative.  Negative for bladder incontinence, dysuria and pelvic pain.  Musculoskeletal: Positive for back pain and arthralgias (left hip pain). Negative for myalgias, joint swelling and gait problem.  Skin: Negative.   Allergic/Immunologic: Negative.   Neurological: Negative.  Negative for dizziness, tingling, tremors, weakness, light-headedness, numbness, headaches and paresthesias.  Hematological: Negative.  Negative for adenopathy. Does not bruise/bleed easily.  Psychiatric/Behavioral: Negative.        Objective:   Physical Exam  Vitals reviewed. Constitutional: He is oriented to person, place, and time. He appears well-developed and well-nourished. No distress.  HENT:   Head: Normocephalic and atraumatic.  Mouth/Throat: Oropharynx is clear and moist. No oropharyngeal exudate.  Eyes: Conjunctivae are normal. Right eye exhibits no discharge. Left eye exhibits no discharge. No scleral icterus.  Neck: Normal range of motion. Neck supple. No JVD present. No tracheal deviation present. No thyromegaly present.  Cardiovascular: Normal rate, regular rhythm, normal heart sounds and intact distal pulses.  Exam reveals no gallop and no friction rub.   No murmur heard. Pulmonary/Chest: Effort normal and breath sounds normal. No stridor. No respiratory distress. He has no wheezes. He has no rales. He exhibits no tenderness.  Abdominal: Soft. Bowel sounds are normal. He exhibits no distension and no mass. There is no tenderness. There is no rebound and no guarding.  Musculoskeletal: Normal range of motion. He exhibits no edema and no tenderness.       Left hip: Normal. He exhibits normal range of motion, normal strength, no tenderness, no bony tenderness, no swelling, no crepitus, no deformity and no laceration.       Lumbar back: Normal. He exhibits normal range of motion, no tenderness, no bony tenderness, no swelling, no edema, no deformity, no laceration, no pain, no spasm and normal pulse.  Lymphadenopathy:    He has no cervical adenopathy.  Neurological: He is alert and oriented to person, place, and time. He has normal strength. He displays no atrophy, no tremor and normal reflexes. No cranial nerve deficit or sensory deficit. He exhibits normal muscle tone. He displays a negative Romberg sign. He displays no seizure activity. Coordination and gait normal. He displays no Babinski's sign on the right side. He displays no Babinski's sign on the left side.  Reflex Scores:      Tricep reflexes are 1+ on the  right side and 1+ on the left side.      Bicep reflexes are 1+ on the right side and 1+ on the left side.      Brachioradialis reflexes are 1+ on the right side and 1+ on  the left side.      Patellar reflexes are 1+ on the right side and 1+ on the left side.      Achilles reflexes are 1+ on the right side and 1+ on the left side. Skin: Skin is warm and dry. No rash noted. He is not diaphoretic. No erythema. No pallor.  Psychiatric: He has a normal mood and affect. His behavior is normal. Judgment and thought content normal.     Lab Results  Component Value Date   WBC 6.2 09/17/2012   HGB 16.5 09/17/2012   HCT 48.9 09/17/2012   PLT 224.0 09/17/2012   GLUCOSE 88 09/17/2012   CHOL 184 05/13/2012   TRIG 337.0* 05/13/2012   HDL 41.70 05/13/2012   LDLDIRECT 94.3 05/13/2012   ALT 26 09/17/2012   AST 24 09/17/2012   NA 141 09/17/2012   K 4.5 09/17/2012   CL 101 09/17/2012   CREATININE 1.0 09/17/2012   BUN 14 09/17/2012   CO2 34* 09/17/2012   TSH 0.72 09/17/2012   INR 1.3 01/13/2009   HGBA1C 5.5 09/17/2012       Assessment & Plan:

## 2013-01-27 NOTE — Patient Instructions (Signed)
Back Pain, Adult Low back pain is very common. About 1 in 5 people have back pain.The cause of low back pain is rarely dangerous. The pain often gets better over time.About half of people with a sudden onset of back pain feel better in just 2 weeks. About 8 in 10 people feel better by 6 weeks.  CAUSES Some common causes of back pain include:  Strain of the muscles or ligaments supporting the spine.  Wear and tear (degeneration) of the spinal discs.  Arthritis.  Direct injury to the back. DIAGNOSIS Most of the time, the direct cause of low back pain is not known.However, back pain can be treated effectively even when the exact cause of the pain is unknown.Answering your caregiver's questions about your overall health and symptoms is one of the most accurate ways to make sure the cause of your pain is not dangerous. If your caregiver needs more information, he or she may order lab work or imaging tests (X-rays or MRIs).However, even if imaging tests show changes in your back, this usually does not require surgery. HOME CARE INSTRUCTIONS For many people, back pain returns.Since low back pain is rarely dangerous, it is often a condition that people can learn to manageon their own.   Remain active. It is stressful on the back to sit or stand in one place. Do not sit, drive, or stand in one place for more than 30 minutes at a time. Take short walks on level surfaces as soon as pain allows.Try to increase the length of time you walk each day.  Do not stay in bed.Resting more than 1 or 2 days can delay your recovery.  Do not avoid exercise or work.Your body is made to move.It is not dangerous to be active, even though your back may hurt.Your back will likely heal faster if you return to being active before your pain is gone.  Pay attention to your body when you bend and lift. Many people have less discomfortwhen lifting if they bend their knees, keep the load close to their bodies,and  avoid twisting. Often, the most comfortable positions are those that put less stress on your recovering back.  Find a comfortable position to sleep. Use a firm mattress and lie on your side with your knees slightly bent. If you lie on your back, put a pillow under your knees.  Only take over-the-counter or prescription medicines as directed by your caregiver. Over-the-counter medicines to reduce pain and inflammation are often the most helpful.Your caregiver may prescribe muscle relaxant drugs.These medicines help dull your pain so you can more quickly return to your normal activities and healthy exercise.  Put ice on the injured area.  Put ice in a plastic bag.  Place a towel between your skin and the bag.  Leave the ice on for 15 to 20 minutes, 3 to 4 times a day for the first 2 to 3 days. After that, ice and heat may be alternated to reduce pain and spasms.  Ask your caregiver about trying back exercises and gentle massage. This may be of some benefit.  Avoid feeling anxious or stressed.Stress increases muscle tension and can worsen back pain.It is important to recognize when you are anxious or stressed and learn ways to manage it.Exercise is a great option. SEEK MEDICAL CARE IF:  You have pain that is not relieved with rest or medicine.  You have pain that does not improve in 1 week.  You have new symptoms.  You are generally   not feeling well. SEEK IMMEDIATE MEDICAL CARE IF:   You have pain that radiates from your back into your legs.  You develop new bowel or bladder control problems.  You have unusual weakness or numbness in your arms or legs.  You develop nausea or vomiting.  You develop abdominal pain.  You feel faint. Document Released: 09/30/2005 Document Revised: 03/31/2012 Document Reviewed: 02/18/2011 [employer] Patient Information 2013 [employer].  

## 2013-01-28 ENCOUNTER — Encounter: Payer: Self-pay | Admitting: Internal Medicine

## 2013-01-29 NOTE — Assessment & Plan Note (Signed)
His BP is well controlled 

## 2013-01-29 NOTE — Assessment & Plan Note (Signed)
Plain film shows mild DJD He will continue the current meds He has been referred to pain management

## 2013-01-29 NOTE — Assessment & Plan Note (Signed)
Referred to pain management at his request

## 2013-02-16 ENCOUNTER — Other Ambulatory Visit: Payer: Self-pay | Admitting: Internal Medicine

## 2013-03-23 ENCOUNTER — Other Ambulatory Visit: Payer: Self-pay | Admitting: Internal Medicine

## 2013-04-08 ENCOUNTER — Ambulatory Visit (INDEPENDENT_AMBULATORY_CARE_PROVIDER_SITE_OTHER): Payer: Managed Care, Other (non HMO) | Admitting: Internal Medicine

## 2013-04-08 ENCOUNTER — Encounter: Payer: Self-pay | Admitting: Internal Medicine

## 2013-04-08 VITALS — BP 140/84 | HR 72 | Temp 97.9°F | Wt 206.0 lb

## 2013-04-08 DIAGNOSIS — M545 Low back pain, unspecified: Secondary | ICD-10-CM

## 2013-04-08 DIAGNOSIS — M79605 Pain in left leg: Secondary | ICD-10-CM

## 2013-04-08 DIAGNOSIS — G8929 Other chronic pain: Secondary | ICD-10-CM

## 2013-04-08 DIAGNOSIS — G894 Chronic pain syndrome: Secondary | ICD-10-CM

## 2013-04-08 DIAGNOSIS — M542 Cervicalgia: Secondary | ICD-10-CM

## 2013-04-08 DIAGNOSIS — M109 Gout, unspecified: Secondary | ICD-10-CM

## 2013-04-08 DIAGNOSIS — I10 Essential (primary) hypertension: Secondary | ICD-10-CM

## 2013-04-08 MED ORDER — ALLOPURINOL 100 MG PO TABS: 100.0000 mg | ORAL_TABLET | Freq: Every day | ORAL | Status: DC

## 2013-04-08 MED ORDER — HYDROCODONE-ACETAMINOPHEN 10-325 MG PO TABS: 1.0000 | ORAL_TABLET | Freq: Three times a day (TID) | ORAL | Status: DC | PRN

## 2013-04-08 MED ORDER — COLCHICINE 0.6 MG PO TABS: 0.6000 mg | ORAL_TABLET | Freq: Two times a day (BID) | ORAL | Status: DC

## 2013-04-08 NOTE — Assessment & Plan Note (Signed)
Restart colchicine, continue allopurinol

## 2013-04-08 NOTE — Assessment & Plan Note (Signed)
Continue current meds as needed

## 2013-04-08 NOTE — Progress Notes (Signed)
Subjective:    Patient ID: [name], male    DOB: 1966-09-19, 47 y.o.   MRN: [id number]  Arthritis Presents for follow-up visit. He complains of pain. He reports no stiffness, joint swelling or joint warmth. Affected locations include the neck and right foot. His pain is at a severity of 3/10. Associated symptoms include fatigue. Pertinent negatives include no diarrhea, dry eyes, dry mouth, dysuria, fever, pain at night, pain while resting, rash, Raynaud's syndrome, uveitis or weight loss. Compliance with total regimen is 76-100%.      Review of Systems  Constitutional: Positive for fatigue. Negative for fever, chills, weight loss, diaphoresis, activity change, appetite change and unexpected weight change.  HENT: Positive for neck pain. Negative for facial swelling and neck stiffness.   Eyes: Negative.   Respiratory: Negative.   Cardiovascular: Negative.  Negative for chest pain, palpitations and leg swelling.  Gastrointestinal: Negative.  Negative for diarrhea.  Endocrine: Negative.   Genitourinary: Negative.  Negative for dysuria.  Musculoskeletal: Positive for back pain and arthritis. Negative for myalgias, joint swelling, arthralgias, gait problem and stiffness.  Skin: Negative.  Negative for rash.  Allergic/Immunologic: Negative.   Neurological: Negative.   Hematological: Negative.  Negative for adenopathy. Does not bruise/bleed easily.  Psychiatric/Behavioral: Negative.        Objective:   Physical Exam  Vitals reviewed. Constitutional: He is oriented to person, place, and time. He appears well-developed and well-nourished. No distress.  HENT:  Head: Normocephalic and atraumatic.  Mouth/Throat: Oropharynx is clear and moist. No oropharyngeal exudate.  Eyes: Conjunctivae are normal. Right eye exhibits no discharge. Left eye exhibits no discharge. No scleral icterus.  Neck: Normal range of motion. Neck supple. No JVD present. No tracheal deviation present. No thyromegaly  present.  Cardiovascular: Normal rate, regular rhythm, normal heart sounds and intact distal pulses.  Exam reveals no gallop and no friction rub.   No murmur heard. Pulmonary/Chest: Effort normal and breath sounds normal. No stridor. No respiratory distress. He has no wheezes. He has no rales. He exhibits no tenderness.  Abdominal: Soft. Bowel sounds are normal. He exhibits no distension and no mass. There is no tenderness. There is no rebound and no guarding.  Musculoskeletal: Normal range of motion. He exhibits no edema and no tenderness.       Cervical back: Normal.       Lumbar back: Normal.       Right foot: Normal. He exhibits normal range of motion, no tenderness, no bony tenderness, no swelling, normal capillary refill, no crepitus, no deformity and no laceration.  Lymphadenopathy:    He has no cervical adenopathy.  Neurological: He is alert and oriented to person, place, and time. He has normal strength. He displays no atrophy, no tremor and normal reflexes. No cranial nerve deficit or sensory deficit. He exhibits normal muscle tone. He displays a negative Romberg sign. He displays no seizure activity. Coordination and gait normal.  Reflex Scores:      Tricep reflexes are 1+ on the right side and 1+ on the left side.      Bicep reflexes are 1+ on the right side and 1+ on the left side.      Brachioradialis reflexes are 1+ on the right side and 1+ on the left side.      Patellar reflexes are 1+ on the right side and 1+ on the left side.      Achilles reflexes are 1+ on the right side and 1+ on the left side.  Skin: Skin is warm and dry. No rash noted. He is not diaphoretic. No erythema. No pallor.  Psychiatric: He has a normal mood and affect. His behavior is normal. Judgment and thought content normal.     Lab Results  Component Value Date   WBC 6.2 09/17/2012   HGB 16.5 09/17/2012   HCT 48.9 09/17/2012   PLT 224.0 09/17/2012   GLUCOSE 88 09/17/2012   CHOL 184 05/13/2012   TRIG  337.0* 05/13/2012   HDL 41.70 05/13/2012   LDLDIRECT 94.3 05/13/2012   ALT 26 09/17/2012   AST 24 09/17/2012   NA 141 09/17/2012   K 4.5 09/17/2012   CL 101 09/17/2012   CREATININE 1.0 09/17/2012   BUN 14 09/17/2012   CO2 34* 09/17/2012   TSH 0.72 09/17/2012   INR 1.3 01/13/2009   HGBA1C 5.5 09/17/2012       Assessment & Plan:

## 2013-04-08 NOTE — Assessment & Plan Note (Signed)
His pain is controlled by the current meds

## 2013-04-08 NOTE — Assessment & Plan Note (Signed)
His BP is well controlled 

## 2013-04-08 NOTE — Patient Instructions (Addendum)
Gout  Gout is an inflammatory condition (arthritis) caused by a buildup of uric acid crystals in the joints. Uric acid is a chemical that is normally present in the blood. Under some circumstances, uric acid can form into crystals in your joints. This causes joint redness, soreness, and swelling (inflammation). Repeat attacks are common. Over time, uric acid crystals can form into masses (tophi) near a joint, causing disfigurement. Gout is treatable and often preventable.  CAUSES   The disease begins with elevated levels of uric acid in the blood. Uric acid is produced by your body when it breaks down a naturally found substance called purines. This also happens when you eat certain foods such as meats and fish. Causes of an elevated uric acid level include:   Being passed down from parent to child (heredity).   Diseases that cause increased uric acid production (obesity, psoriasis, some cancers).   Excessive alcohol use.   Diet, especially diets rich in meat and seafood.   Medicines, including certain cancer-fighting drugs (chemotherapy), diuretics, and aspirin.   Chronic kidney disease. The kidneys are no longer able to remove uric acid well.   Problems with metabolism.  Conditions strongly associated with gout include:   Obesity.   High blood pressure.   High cholesterol.   Diabetes.  Not everyone with elevated uric acid levels gets gout. It is not understood why some people get gout and others do not. Surgery, joint injury, and eating too much of certain foods are some of the factors that can lead to gout.  SYMPTOMS    An attack of gout comes on quickly. It causes intense pain with redness, swelling, and warmth in a joint.   Fever can occur.   Often, only one joint is involved. Certain joints are more commonly involved:   Base of the big toe.   Knee.   Ankle.   Wrist.   Finger.  Without treatment, an attack usually goes away in a few days to weeks. Between attacks, you usually will not have  symptoms, which is different from many other forms of arthritis.  DIAGNOSIS   Your caregiver will suspect gout based on your symptoms and exam. Removal of fluid from the joint (arthrocentesis) is done to check for uric acid crystals. Your caregiver will give you a medicine that numbs the area (local anesthetic) and use a needle to remove joint fluid for exam. Gout is confirmed when uric acid crystals are seen in joint fluid, using a special microscope. Sometimes, blood, urine, and X-ray tests are also used.  TREATMENT   There are 2 phases to gout treatment: treating the sudden onset (acute) attack and preventing attacks (prophylaxis).  Treatment of an Acute Attack   Medicines are used. These include anti-inflammatory medicines or steroid medicines.   An injection of steroid medicine into the affected joint is sometimes necessary.   The painful joint is rested. Movement can worsen the arthritis.   You may use warm or cold treatments on painful joints, depending which works best for you.   Discuss the use of coffee, vitamin C, or cherries with your caregiver. These may be helpful treatment options.  Treatment to Prevent Attacks  After the acute attack subsides, your caregiver may advise prophylactic medicine. These medicines either help your kidneys eliminate uric acid from your body or decrease your uric acid production. You may need to stay on these medicines for a very long time.  The early phase of treatment with prophylactic medicine can be associated   with an increase in acute gout attacks. For this reason, during the first few months of treatment, your caregiver may also advise you to take medicines usually used for acute gout treatment. Be sure you understand your caregiver's directions.  You should also discuss dietary treatment with your caregiver. Certain foods such as meats and fish can increase uric acid levels. Other foods such as dairy can decrease levels. Your caregiver can give you a list of foods  to avoid.  HOME CARE INSTRUCTIONS    Do not take aspirin to relieve pain. This raises uric acid levels.   Only take over-the-counter or prescription medicines for pain, discomfort, or fever as directed by your caregiver.   Rest the joint as much as possible. When in bed, keep sheets and blankets off painful areas.   Keep the affected joint raised (elevated).   Use crutches if the painful joint is in your leg.   Drink enough water and fluids to keep your urine clear or pale yellow. This helps your body get rid of uric acid. Do not drink alcoholic beverages. They slow the passage of uric acid.   Follow your caregiver's dietary instructions. Pay careful attention to the amount of protein you eat. Your daily diet should emphasize fruits, vegetables, whole grains, and fat-free or low-fat milk products.   Maintain a healthy body weight.  SEEK MEDICAL CARE IF:    You have an oral temperature above 102 F (38.9 C).   You develop diarrhea, vomiting, or any side effects from medicines.   You do not feel better in 24 hours, or you are getting worse.  SEEK IMMEDIATE MEDICAL CARE IF:    Your joint becomes suddenly more tender and you have:   Chills.   An oral temperature above 102 F (38.9 C), not controlled by medicine.  MAKE SURE YOU:    Understand these instructions.   Will watch your condition.   Will get help right away if you are not doing well or get worse.  Document Released: 09/27/2000 Document Revised: 12/23/2011 Document Reviewed: 01/08/2010  [employer] Patient Information 2014 [employer].

## 2013-05-10 ENCOUNTER — Other Ambulatory Visit: Payer: Self-pay | Admitting: Internal Medicine

## 2013-08-02 ENCOUNTER — Ambulatory Visit (INDEPENDENT_AMBULATORY_CARE_PROVIDER_SITE_OTHER): Payer: Managed Care, Other (non HMO) | Admitting: Internal Medicine

## 2013-08-02 ENCOUNTER — Encounter: Payer: Self-pay | Admitting: Internal Medicine

## 2013-08-02 VITALS — BP 122/84 | HR 80 | Temp 98.0°F | Resp 16 | Ht 70.0 in | Wt 209.2 lb

## 2013-08-02 DIAGNOSIS — M542 Cervicalgia: Secondary | ICD-10-CM

## 2013-08-02 DIAGNOSIS — M545 Low back pain, unspecified: Secondary | ICD-10-CM

## 2013-08-02 DIAGNOSIS — G8929 Other chronic pain: Secondary | ICD-10-CM

## 2013-08-02 DIAGNOSIS — M79605 Pain in left leg: Secondary | ICD-10-CM

## 2013-08-02 NOTE — Patient Instructions (Signed)
Back Pain, Adult  Low back pain is very common. About 1 in 5 people have back pain. The cause of low back pain is rarely dangerous. The pain often gets better over time. About half of people with a sudden onset of back pain feel better in just 2 weeks. About 8 in 10 people feel better by 6 weeks.   CAUSES  Some common causes of back pain include:  · Strain of the muscles or ligaments supporting the spine.  · Wear and tear (degeneration) of the spinal discs.  · Arthritis.  · Direct injury to the back.  DIAGNOSIS  Most of the time, the direct cause of low back pain is not known. However, back pain can be treated effectively even when the exact cause of the pain is unknown. Answering your caregiver's questions about your overall health and symptoms is one of the most accurate ways to make sure the cause of your pain is not dangerous. If your caregiver needs more information, he or she may order lab work or imaging tests (X-rays or MRIs). However, even if imaging tests show changes in your back, this usually does not require surgery.  HOME CARE INSTRUCTIONS  For many people, back pain returns. Since low back pain is rarely dangerous, it is often a condition that people can learn to manage on their own.   · Remain active. It is stressful on the back to sit or stand in one place. Do not sit, drive, or stand in one place for more than 30 minutes at a time. Take short walks on level surfaces as soon as pain allows. Try to increase the length of time you walk each day.  · Do not stay in bed. Resting more than 1 or 2 days can delay your recovery.  · Do not avoid exercise or work. Your body is made to move. It is not dangerous to be active, even though your back may hurt. Your back will likely heal faster if you return to being active before your pain is gone.  · Pay attention to your body when you  bend and lift. Many people have less discomfort when lifting if they bend their knees, keep the load close to their bodies, and  avoid twisting. Often, the most comfortable positions are those that put less stress on your recovering back.  · Find a comfortable position to sleep. Use a firm mattress and lie on your side with your knees slightly bent. If you lie on your back, put a pillow under your knees.  · Only take over-the-counter or prescription medicines as directed by your caregiver. Over-the-counter medicines to reduce pain and inflammation are often the most helpful. Your caregiver may prescribe muscle relaxant drugs. These medicines help dull your pain so you can more quickly return to your normal activities and healthy exercise.  · Put ice on the injured area.  · Put ice in a plastic bag.  · Place a towel between your skin and the bag.  · Leave the ice on for 15-20 minutes, 3-4 times a day for the first 2 to 3 days. After that, ice and heat may be alternated to reduce pain and spasms.  · Ask your caregiver about trying back exercises and gentle massage. This may be of some benefit.  · Avoid feeling anxious or stressed. Stress increases muscle tension and can worsen back pain. It is important to recognize when you are anxious or stressed and learn ways to manage it. Exercise is a great option.  SEEK MEDICAL CARE IF:  · You have pain that is not relieved with rest or   medicine.  · You have pain that does not improve in 1 week.  · You have new symptoms.  · You are generally not feeling well.  SEEK IMMEDIATE MEDICAL CARE IF:   · You have pain that radiates from your back into your legs.  · You develop new bowel or bladder control problems.  · You have unusual weakness or numbness in your arms or legs.  · You develop nausea or vomiting.  · You develop abdominal pain.  · You feel faint.  Document Released: 09/30/2005 Document Revised: 03/31/2012 Document Reviewed: 02/18/2011  [employer]® Patient Information ©2014 [employer].

## 2013-08-02 NOTE — Progress Notes (Signed)
Subjective:    Patient ID: [name], male    DOB: 15-Sep-1966, 47 y.o.   MRN: [id number]  Back Pain This is a chronic problem. The current episode started more than 1 year ago. The problem occurs constantly. The problem has been gradually worsening since onset. The pain is present in the lumbar spine. The quality of the pain is described as aching and shooting. The pain radiates to the left thigh. The pain is at a severity of 3/10. The pain is mild. The pain is worse during the day. The symptoms are aggravated by bending and position. Associated symptoms include leg pain. Pertinent negatives include no abdominal pain, bladder incontinence, bowel incontinence, chest pain, dysuria, fever, headaches, numbness, paresis, paresthesias, pelvic pain, perianal numbness, tingling, weakness or weight loss. He has tried analgesics for the symptoms. The treatment provided mild relief.      Review of Systems  Constitutional: Negative.  Negative for fever, chills, weight loss, diaphoresis, appetite change and fatigue.  HENT: Negative.   Eyes: Negative.   Respiratory: Negative.  Negative for cough, chest tightness, shortness of breath, wheezing and stridor.   Cardiovascular: Negative.  Negative for chest pain, palpitations and leg swelling.  Gastrointestinal: Negative.  Negative for nausea, vomiting, abdominal pain, diarrhea, constipation and bowel incontinence.  Endocrine: Negative.   Genitourinary: Negative.  Negative for bladder incontinence, dysuria and pelvic pain.  Musculoskeletal: Positive for back pain and neck pain. Negative for arthralgias, gait problem, joint swelling, myalgias and neck stiffness.  Skin: Negative.   Allergic/Immunologic: Negative.   Neurological: Negative.  Negative for dizziness, tingling, tremors, weakness, numbness, headaches and paresthesias.  Hematological: Negative.  Negative for adenopathy. Does not bruise/bleed easily.  Psychiatric/Behavioral: Negative.         Objective:   Physical Exam  Vitals reviewed. Constitutional: He is oriented to person, place, and time. He appears well-developed and well-nourished. No distress.  HENT:  Head: Normocephalic and atraumatic.  Mouth/Throat: Oropharynx is clear and moist. No oropharyngeal exudate.  Eyes: Conjunctivae are normal. Right eye exhibits no discharge. Left eye exhibits no discharge. No scleral icterus.  Neck: Normal range of motion. Neck supple. No JVD present. No tracheal deviation present. No thyromegaly present.  Cardiovascular: Normal rate, regular rhythm, normal heart sounds and intact distal pulses.  Exam reveals no gallop.   No murmur heard. Pulmonary/Chest: Effort normal and breath sounds normal. No stridor. No respiratory distress. He has no wheezes. He has no rales. He exhibits no tenderness.  Abdominal: Soft. Bowel sounds are normal. He exhibits no distension. There is no tenderness. There is no rebound and no guarding.  Musculoskeletal: Normal range of motion. He exhibits no edema and no tenderness.       Cervical back: Normal. He exhibits normal range of motion, no tenderness, no bony tenderness, no swelling, no deformity and no spasm.       Lumbar back: Normal. He exhibits normal range of motion, no tenderness, no bony tenderness, no swelling, no edema, no deformity, no laceration, no pain, no spasm and normal pulse.  Lymphadenopathy:    He has no cervical adenopathy.  Neurological: He is alert and oriented to person, place, and time. He has normal strength. He displays no atrophy, no tremor and normal reflexes. No cranial nerve deficit or sensory deficit. He exhibits normal muscle tone. He displays a negative Romberg sign. He displays no seizure activity. Coordination and gait normal. He displays no Babinski's sign on the right side. He displays no Babinski's sign on the left  side.  Reflex Scores:      Tricep reflexes are 1+ on the right side and 1+ on the left side.      Bicep reflexes  are 1+ on the right side and 1+ on the left side.      Brachioradialis reflexes are 1+ on the right side and 1+ on the left side.      Patellar reflexes are 1+ on the right side and 1+ on the left side.      Achilles reflexes are 1+ on the right side and 1+ on the left side. Neg SLR in BLE  Skin: Skin is warm and dry. No rash noted. He is not diaphoretic. No erythema. No pallor.  Psychiatric: He has a normal mood and affect. His behavior is normal. Judgment and thought content normal.     Lab Results  Component Value Date   WBC 6.2 09/17/2012   HGB 16.5 09/17/2012   HCT 48.9 09/17/2012   PLT 224.0 09/17/2012   GLUCOSE 88 09/17/2012   CHOL 184 05/13/2012   TRIG 337.0* 05/13/2012   HDL 41.70 05/13/2012   LDLDIRECT 94.3 05/13/2012   ALT 26 09/17/2012   AST 24 09/17/2012   NA 141 09/17/2012   K 4.5 09/17/2012   CL 101 09/17/2012   CREATININE 1.0 09/17/2012   BUN 14 09/17/2012   CO2 34* 09/17/2012   TSH 0.72 09/17/2012   INR 1.3 01/13/2009   HGBA1C 5.5 09/17/2012       Assessment & Plan:

## 2013-08-03 ENCOUNTER — Encounter: Payer: Self-pay | Admitting: Internal Medicine

## 2013-08-03 NOTE — Assessment & Plan Note (Signed)
He reports worsening pain so I have ordered an MRI to see if there is spinal stenosis, nerve impingement, HNP, etc He will cont the current meds for pain

## 2013-08-03 NOTE — Assessment & Plan Note (Signed)
He reports worsening pain so I have ordered an MRI to see if there is progression of his pathology He will cont the current meds for pain relief

## 2013-08-09 ENCOUNTER — Ambulatory Visit: Payer: Managed Care, Other (non HMO) | Admitting: Internal Medicine

## 2013-08-09 DIAGNOSIS — Z0289 Encounter for other administrative examinations: Secondary | ICD-10-CM

## 2013-11-06 ENCOUNTER — Other Ambulatory Visit: Payer: Self-pay | Admitting: Internal Medicine

## 2013-11-07 ENCOUNTER — Other Ambulatory Visit: Payer: Self-pay | Admitting: Internal Medicine

## 2014-05-20 ENCOUNTER — Ambulatory Visit: Payer: Managed Care, Other (non HMO) | Admitting: Family Medicine

## 2014-05-20 DIAGNOSIS — Z0289 Encounter for other administrative examinations: Secondary | ICD-10-CM

## 2014-05-23 ENCOUNTER — Other Ambulatory Visit: Payer: Self-pay | Admitting: Internal Medicine

## 2014-05-24 ENCOUNTER — Telehealth: Payer: Self-pay | Admitting: Family Medicine

## 2014-05-24 NOTE — Telephone Encounter (Signed)
Patient no showed for new pt appt on 08/07.  Please advise.

## 2014-05-24 NOTE — Telephone Encounter (Signed)
Noted  

## 2014-05-31 ENCOUNTER — Other Ambulatory Visit: Payer: Self-pay | Admitting: Internal Medicine

## 2014-10-27 ENCOUNTER — Encounter: Payer: Self-pay | Admitting: Internal Medicine

## 2014-10-27 ENCOUNTER — Ambulatory Visit (INDEPENDENT_AMBULATORY_CARE_PROVIDER_SITE_OTHER): Payer: Managed Care, Other (non HMO) | Admitting: Internal Medicine

## 2014-10-27 VITALS — BP 120/80 | HR 71 | Temp 98.6°F | Resp 16 | Ht 70.0 in | Wt 219.0 lb

## 2014-10-27 DIAGNOSIS — M1000 Idiopathic gout, unspecified site: Secondary | ICD-10-CM

## 2014-10-27 DIAGNOSIS — G8929 Other chronic pain: Secondary | ICD-10-CM

## 2014-10-27 DIAGNOSIS — M549 Dorsalgia, unspecified: Secondary | ICD-10-CM

## 2014-10-27 DIAGNOSIS — G894 Chronic pain syndrome: Secondary | ICD-10-CM

## 2014-10-27 DIAGNOSIS — M542 Cervicalgia: Secondary | ICD-10-CM

## 2014-10-27 DIAGNOSIS — M79605 Pain in left leg: Secondary | ICD-10-CM

## 2014-10-27 DIAGNOSIS — M545 Low back pain, unspecified: Secondary | ICD-10-CM

## 2014-10-27 DIAGNOSIS — F418 Other specified anxiety disorders: Secondary | ICD-10-CM

## 2014-10-27 DIAGNOSIS — I10 Essential (primary) hypertension: Secondary | ICD-10-CM

## 2014-10-27 MED ORDER — TRAZODONE HCL 100 MG PO TABS: 100.0000 mg | ORAL_TABLET | Freq: Every evening | ORAL | Status: DC | PRN

## 2014-10-27 MED ORDER — HYDROCODONE-ACETAMINOPHEN 10-325 MG PO TABS: 1.0000 | ORAL_TABLET | Freq: Three times a day (TID) | ORAL | Status: DC | PRN

## 2014-10-27 MED ORDER — METOPROLOL SUCCINATE ER 100 MG PO TB24: 200.0000 mg | ORAL_TABLET | Freq: Every day | ORAL | Status: DC

## 2014-10-27 MED ORDER — COLCHICINE 0.6 MG PO TABS: 0.6000 mg | ORAL_TABLET | Freq: Two times a day (BID) | ORAL | Status: DC

## 2014-10-27 NOTE — Progress Notes (Signed)
Subjective:    Patient ID: [name], male    DOB: 29-Jan-1966, 49 y.o.   MRN: [id number]  Back Pain This is a new problem. The current episode started in the past 7 days. The problem occurs intermittently. The problem has been gradually worsening since onset. The pain is present in the lumbar spine. The quality of the pain is described as stabbing. The pain does not radiate. The pain is at a severity of 3/10. The pain is mild. The pain is worse during the day. The symptoms are aggravated by bending and position. Pertinent negatives include no abdominal pain, bladder incontinence, bowel incontinence, chest pain, dysuria, fever, headaches, leg pain, numbness, paresis, paresthesias, pelvic pain, perianal numbness, tingling, weakness or weight loss. Risk factors include lack of exercise. He has tried nothing for the symptoms. The treatment provided no relief.      Review of Systems  Constitutional: Negative.  Negative for fever, chills, weight loss, diaphoresis, appetite change and fatigue.  HENT: Negative.   Eyes: Negative.   Respiratory: Negative.  Negative for cough, choking, chest tightness, shortness of breath and stridor.   Cardiovascular: Negative.  Negative for chest pain, palpitations and leg swelling.  Gastrointestinal: Negative.  Negative for nausea, vomiting, abdominal pain, diarrhea, constipation, blood in stool and bowel incontinence.  Endocrine: Negative.   Genitourinary: Negative.  Negative for bladder incontinence, dysuria and pelvic pain.  Musculoskeletal: Positive for back pain and arthralgias (rt foot). Negative for myalgias, joint swelling, gait problem, neck pain and neck stiffness.  Skin: Negative.  Negative for rash.  Allergic/Immunologic: Negative.   Neurological: Negative.  Negative for dizziness, tingling, weakness, numbness, headaches and paresthesias.  Hematological: Negative.   Psychiatric/Behavioral: Positive for sleep disturbance and dysphoric mood. Negative  for suicidal ideas, hallucinations, behavioral problems, confusion, self-injury, decreased concentration and agitation. The patient is nervous/anxious. The patient is not hyperactive.        Objective:   Physical Exam  Constitutional: He is oriented to person, place, and time. He appears well-developed and well-nourished. No distress.  HENT:  Head: Normocephalic and atraumatic.  Mouth/Throat: Oropharynx is clear and moist. No oropharyngeal exudate.  Eyes: Conjunctivae are normal. Right eye exhibits no discharge. Left eye exhibits no discharge. No scleral icterus.  Neck: Normal range of motion. Neck supple. No JVD present. No tracheal deviation present. No thyromegaly present.  Cardiovascular: Normal rate, regular rhythm, normal heart sounds and intact distal pulses.  Exam reveals no gallop and no friction rub.   No murmur heard. Pulmonary/Chest: Effort normal and breath sounds normal. No stridor. No respiratory distress. He has no wheezes. He has no rales. He exhibits no tenderness.  Abdominal: Soft. Bowel sounds are normal. He exhibits no distension and no mass. There is no tenderness. There is no rebound and no guarding.  Musculoskeletal: Normal range of motion. He exhibits no edema or tenderness.       Lumbar back: Normal. He exhibits normal range of motion, no tenderness, no bony tenderness, no swelling, no edema, no deformity, no laceration, no pain, no spasm and normal pulse.       Right foot: Normal. There is normal range of motion, no tenderness, no bony tenderness, no swelling, normal capillary refill, no crepitus, no deformity and no laceration.  Lymphadenopathy:    He has no cervical adenopathy.  Neurological: He is alert and oriented to person, place, and time. He has normal strength. He displays no atrophy, no tremor and normal reflexes. No cranial nerve deficit or sensory deficit.  He exhibits normal muscle tone. He displays a negative Romberg sign. He displays no seizure activity.  Coordination and gait normal.  Reflex Scores:      Tricep reflexes are 1+ on the right side and 1+ on the left side.      Bicep reflexes are 1+ on the right side and 1+ on the left side.      Brachioradialis reflexes are 1+ on the right side and 1+ on the left side.      Patellar reflexes are 1+ on the right side and 1+ on the left side.      Achilles reflexes are 1+ on the right side and 1+ on the left side. Neg SLR in BLE  Skin: Skin is warm and dry. No rash noted. He is not diaphoretic. No erythema. No pallor.  Psychiatric: He has a normal mood and affect. His behavior is normal. Judgment and thought content normal.  Vitals reviewed.     Lab Results  Component Value Date   WBC 6.2 09/17/2012   HGB 16.5 09/17/2012   HCT 48.9 09/17/2012   PLT 224.0 09/17/2012   GLUCOSE 88 09/17/2012   CHOL 184 05/13/2012   TRIG 337.0* 05/13/2012   HDL 41.70 05/13/2012   LDLDIRECT 94.3 05/13/2012   ALT 26 09/17/2012   AST 24 09/17/2012   NA 141 09/17/2012   K 4.5 09/17/2012   CL 101 09/17/2012   CREATININE 1.0 09/17/2012   BUN 14 09/17/2012   CO2 34* 09/17/2012   TSH 0.72 09/17/2012   INR 1.3 01/13/2009   HGBA1C 5.5 09/17/2012      Assessment & Plan:

## 2014-10-27 NOTE — Assessment & Plan Note (Signed)
His BP is well controlled 

## 2014-10-27 NOTE — Assessment & Plan Note (Signed)
He is doing well on lexapro and trazodone

## 2014-10-27 NOTE — Patient Instructions (Signed)
Back Pain, Adult Low back pain is very common. About 1 in 5 people have back pain.The cause of low back pain is rarely dangerous. The pain often gets better over time.About half of people with a sudden onset of back pain feel better in just 2 weeks. About 8 in 10 people feel better by 6 weeks.  CAUSES Some common causes of back pain include:  Strain of the muscles or ligaments supporting the spine.  Wear and tear (degeneration) of the spinal discs.  Arthritis.  Direct injury to the back. DIAGNOSIS Most of the time, the direct cause of low back pain is not known.However, back pain can be treated effectively even when the exact cause of the pain is unknown.Answering your caregiver's questions about your overall health and symptoms is one of the most accurate ways to make sure the cause of your pain is not dangerous. If your caregiver needs more information, he or she may order lab work or imaging tests (X-rays or MRIs).However, even if imaging tests show changes in your back, this usually does not require surgery. HOME CARE INSTRUCTIONS For many people, back pain returns.Since low back pain is rarely dangerous, it is often a condition that people can learn to manageon their own.   Remain active. It is stressful on the back to sit or stand in one place. Do not sit, drive, or stand in one place for more than 30 minutes at a time. Take short walks on level surfaces as soon as pain allows.Try to increase the length of time you walk each day.  Do not stay in bed.Resting more than 1 or 2 days can delay your recovery.  Do not avoid exercise or work.Your body is made to move.It is not dangerous to be active, even though your back may hurt.Your back will likely heal faster if you return to being active before your pain is gone.  Pay attention to your body when you bend and lift. Many people have less discomfortwhen lifting if they bend their knees, keep the load close to their bodies,and  avoid twisting. Often, the most comfortable positions are those that put less stress on your recovering back.  Find a comfortable position to sleep. Use a firm mattress and lie on your side with your knees slightly bent. If you lie on your back, put a pillow under your knees.  Only take over-the-counter or prescription medicines as directed by your caregiver. Over-the-counter medicines to reduce pain and inflammation are often the most helpful.Your caregiver may prescribe muscle relaxant drugs.These medicines help dull your pain so you can more quickly return to your normal activities and healthy exercise.  Put ice on the injured area.  Put ice in a plastic bag.  Place a towel between your skin and the bag.  Leave the ice on for 15-20 minutes, 03-04 times a day for the first 2 to 3 days. After that, ice and heat may be alternated to reduce pain and spasms.  Ask your caregiver about trying back exercises and gentle massage. This may be of some benefit.  Avoid feeling anxious or stressed.Stress increases muscle tension and can worsen back pain.It is important to recognize when you are anxious or stressed and learn ways to manage it.Exercise is a great option. SEEK MEDICAL CARE IF:  You have pain that is not relieved with rest or medicine.  You have pain that does not improve in 1 week.  You have new symptoms.  You are generally not feeling well. SEEK   IMMEDIATE MEDICAL CARE IF:   You have pain that radiates from your back into your legs.  You develop new bowel or bladder control problems.  You have unusual weakness or numbness in your arms or legs.  You develop nausea or vomiting.  You develop abdominal pain.  You feel faint. Document Released: 09/30/2005 Document Revised: 03/31/2012 Document Reviewed: 02/01/2014 [employer] Patient Information 2015 [employer]. This information is not intended to replace advice given to you by your health care provider. Make sure you  discuss any questions you have with your health care provider.  

## 2014-10-27 NOTE — Assessment & Plan Note (Signed)
There is no concern for radicular s/s and no warning signs He can't take nsaids due to a prior severe bleed Will try norco for pain relief He will start exercises at home If pain does not resolve in the next 6 weeks then will consider getting an image of the low back

## 2014-10-27 NOTE — Assessment & Plan Note (Signed)
He complains of foot pain but the exam is normal Will restart colchicine

## 2014-12-01 ENCOUNTER — Other Ambulatory Visit (INDEPENDENT_AMBULATORY_CARE_PROVIDER_SITE_OTHER): Payer: Managed Care, Other (non HMO)

## 2014-12-01 ENCOUNTER — Ambulatory Visit (INDEPENDENT_AMBULATORY_CARE_PROVIDER_SITE_OTHER): Payer: Managed Care, Other (non HMO) | Admitting: Internal Medicine

## 2014-12-01 ENCOUNTER — Encounter: Payer: Self-pay | Admitting: Internal Medicine

## 2014-12-01 ENCOUNTER — Ambulatory Visit (INDEPENDENT_AMBULATORY_CARE_PROVIDER_SITE_OTHER)
Admission: RE | Admit: 2014-12-01 | Discharge: 2014-12-01 | Disposition: A | Discharge status: Home / Self Care | Payer: Managed Care, Other (non HMO) | Source: Ambulatory Visit | Attending: Internal Medicine | Admitting: Internal Medicine

## 2014-12-01 VITALS — BP 120/84 | HR 83 | Temp 98.3°F | Resp 16 | Ht 70.0 in | Wt 222.0 lb

## 2014-12-01 DIAGNOSIS — I10 Essential (primary) hypertension: Secondary | ICD-10-CM

## 2014-12-01 DIAGNOSIS — M549 Dorsalgia, unspecified: Secondary | ICD-10-CM

## 2014-12-01 DIAGNOSIS — R739 Hyperglycemia, unspecified: Secondary | ICD-10-CM

## 2014-12-01 DIAGNOSIS — F418 Other specified anxiety disorders: Secondary | ICD-10-CM

## 2014-12-01 DIAGNOSIS — M1A0790 Idiopathic chronic gout, unspecified ankle and foot, without tophus (tophi): Secondary | ICD-10-CM

## 2014-12-01 DIAGNOSIS — E118 Type 2 diabetes mellitus with unspecified complications: Secondary | ICD-10-CM

## 2014-12-01 LAB — COMPREHENSIVE METABOLIC PANEL
ALBUMIN: 4.1 g/dL (ref 3.5–5.2)
ALT: 32 U/L (ref 0–53)
AST: 23 U/L (ref 0–37)
Alkaline Phosphatase: 78 U/L (ref 39–117)
BUN: 11 mg/dL (ref 6–23)
CALCIUM: 9.2 mg/dL (ref 8.4–10.5)
CHLORIDE: 104 meq/L (ref 96–112)
CO2: 32 meq/L (ref 19–32)
Creatinine, Ser: 1 mg/dL (ref 0.40–1.50)
GFR: 84.56 mL/min (ref >60.00)
GLUCOSE: 116 mg/dL — AB (ref 70–99)
POTASSIUM: 4.1 meq/L (ref 3.5–5.1)
SODIUM: 140 meq/L (ref 135–145)
TOTAL PROTEIN: 7.2 g/dL (ref 6.0–8.3)
Total Bilirubin: 0.5 mg/dL (ref 0.2–1.2)

## 2014-12-01 LAB — URIC ACID: Uric Acid, Serum: 8 mg/dL — ABNORMAL HIGH (ref 4.0–7.8)

## 2014-12-01 LAB — HEMOGLOBIN A1C: Hgb A1c MFr Bld: 7.4 % — ABNORMAL HIGH (ref 4.6–6.5)

## 2014-12-01 LAB — SEDIMENTATION RATE: Sed Rate: 9 mm/hr (ref 0–22)

## 2014-12-01 MED ORDER — HYDROCODONE-ACETAMINOPHEN 10-325 MG PO TABS: 1.0000 | ORAL_TABLET | Freq: Three times a day (TID) | ORAL | Status: DC | PRN

## 2014-12-01 MED ORDER — TIZANIDINE HCL 4 MG PO CAPS
4.0000 mg | ORAL_CAPSULE | Freq: Three times a day (TID) | ORAL | Status: DC
Start: 2014-12-01 — End: 2015-03-01

## 2014-12-01 NOTE — Patient Instructions (Signed)
Back Pain, Adult Low back pain is very common. About 1 in 5 people have back pain.The cause of low back pain is rarely dangerous. The pain often gets better over time.About half of people with a sudden onset of back pain feel better in just 2 weeks. About 8 in 10 people feel better by 6 weeks.  CAUSES Some common causes of back pain include:  Strain of the muscles or ligaments supporting the spine.  Wear and tear (degeneration) of the spinal discs.  Arthritis.  Direct injury to the back. DIAGNOSIS Most of the time, the direct cause of low back pain is not known.However, back pain can be treated effectively even when the exact cause of the pain is unknown.Answering your caregiver's questions about your overall health and symptoms is one of the most accurate ways to make sure the cause of your pain is not dangerous. If your caregiver needs more information, he or she may order lab work or imaging tests (X-rays or MRIs).However, even if imaging tests show changes in your back, this usually does not require surgery. HOME CARE INSTRUCTIONS For many people, back pain returns.Since low back pain is rarely dangerous, it is often a condition that people can learn to manageon their own.   Remain active. It is stressful on the back to sit or stand in one place. Do not sit, drive, or stand in one place for more than 30 minutes at a time. Take short walks on level surfaces as soon as pain allows.Try to increase the length of time you walk each day.  Do not stay in bed.Resting more than 1 or 2 days can delay your recovery.  Do not avoid exercise or work.Your body is made to move.It is not dangerous to be active, even though your back may hurt.Your back will likely heal faster if you return to being active before your pain is gone.  Pay attention to your body when you bend and lift. Many people have less discomfortwhen lifting if they bend their knees, keep the load close to their bodies,and  avoid twisting. Often, the most comfortable positions are those that put less stress on your recovering back.  Find a comfortable position to sleep. Use a firm mattress and lie on your side with your knees slightly bent. If you lie on your back, put a pillow under your knees.  Only take over-the-counter or prescription medicines as directed by your caregiver. Over-the-counter medicines to reduce pain and inflammation are often the most helpful.Your caregiver may prescribe muscle relaxant drugs.These medicines help dull your pain so you can more quickly return to your normal activities and healthy exercise.  Put ice on the injured area.  Put ice in a plastic bag.  Place a towel between your skin and the bag.  Leave the ice on for 15-20 minutes, 03-04 times a day for the first 2 to 3 days. After that, ice and heat may be alternated to reduce pain and spasms.  Ask your caregiver about trying back exercises and gentle massage. This may be of some benefit.  Avoid feeling anxious or stressed.Stress increases muscle tension and can worsen back pain.It is important to recognize when you are anxious or stressed and learn ways to manage it.Exercise is a great option. SEEK MEDICAL CARE IF:  You have pain that is not relieved with rest or medicine.  You have pain that does not improve in 1 week.  You have new symptoms.  You are generally not feeling well. SEEK   IMMEDIATE MEDICAL CARE IF:   You have pain that radiates from your back into your legs.  You develop new bowel or bladder control problems.  You have unusual weakness or numbness in your arms or legs.  You develop nausea or vomiting.  You develop abdominal pain.  You feel faint. Document Released: 09/30/2005 Document Revised: 03/31/2012 Document Reviewed: 02/01/2014 [employer] Patient Information 2015 [employer]. This information is not intended to replace advice given to you by your health care provider. Make sure you  discuss any questions you have with your health care provider.  

## 2014-12-01 NOTE — Progress Notes (Signed)
Pre visit review using our clinic review tool, if applicable. No additional management support is needed unless otherwise documented below in the visit note. 

## 2014-12-02 ENCOUNTER — Encounter: Payer: Self-pay | Admitting: Internal Medicine

## 2014-12-02 MED ORDER — FEBUXOSTAT 40 MG PO TABS: 80.0000 mg | ORAL_TABLET | Freq: Every day | ORAL | Status: DC

## 2014-12-02 NOTE — Assessment & Plan Note (Signed)
His BP is well controlled Lytes and renal function are stable 

## 2014-12-02 NOTE — Assessment & Plan Note (Signed)
This is a new diagnosis No meds needed at this time Will send him for diabetic education

## 2014-12-02 NOTE — Assessment & Plan Note (Signed)
There are no acute swollen joints today His uric acid level is 8 I have asked him to start uloric to prevent future gout attacks with a UA goal of <6

## 2014-12-02 NOTE — Assessment & Plan Note (Signed)
Exam and plain films are WNL Will cont the current meds for pain

## 2014-12-02 NOTE — Progress Notes (Signed)
Subjective:    Patient ID: [name], male    DOB: 1966-09-13, 49 y.o.   MRN: [id number]  Hypertension This is a chronic problem. The current episode started more than 1 year ago. The problem has been gradually improving since onset. The problem is controlled. Pertinent negatives include no chest pain, headaches, palpitations or shortness of breath.  Back Pain This is a chronic problem. The current episode started more than 1 year ago. The problem has been gradually worsening since onset. The pain is present in the lumbar spine. The quality of the pain is described as aching. Radiates to: left hip. The pain is at a severity of 6/10. The pain is moderate. The pain is worse during the day. The symptoms are aggravated by bending and position. Pertinent negatives include no abdominal pain, bladder incontinence, bowel incontinence, chest pain, dysuria, fever, headaches, leg pain, numbness, paresis, paresthesias, pelvic pain, perianal numbness, tingling, weakness or weight loss. Risk factors include obesity, lack of exercise and sedentary lifestyle. He has tried NSAIDs and analgesics for the symptoms. The treatment provided moderate relief.      Review of Systems  Constitutional: Negative.  Negative for fever, chills, weight loss, diaphoresis, appetite change and fatigue.  HENT: Negative.   Eyes: Negative.   Respiratory: Negative.  Negative for cough, choking, chest tightness, shortness of breath and stridor.   Cardiovascular: Negative.  Negative for chest pain, palpitations and leg swelling.  Gastrointestinal: Negative.  Negative for nausea, vomiting, abdominal pain, diarrhea, constipation, blood in stool and bowel incontinence.  Endocrine: Negative.  Negative for polydipsia, polyphagia and polyuria.  Genitourinary: Negative.  Negative for bladder incontinence, dysuria, difficulty urinating and pelvic pain.  Musculoskeletal: Positive for back pain and arthralgias. Negative for myalgias, joint  swelling, gait problem and neck stiffness.  Skin: Negative.   Allergic/Immunologic: Negative.   Neurological: Negative.  Negative for tingling, weakness, numbness, headaches and paresthesias.  Hematological: Negative.  Negative for adenopathy. Does not bruise/bleed easily.  Psychiatric/Behavioral: Positive for sleep disturbance and dysphoric mood. Negative for suicidal ideas, hallucinations, behavioral problems, confusion, self-injury, decreased concentration and agitation. The patient is nervous/anxious. The patient is not hyperactive.        Objective:   Physical Exam  Constitutional: He is oriented to person, place, and time. He appears well-developed and well-nourished. No distress.  HENT:  Head: Normocephalic and atraumatic.  Mouth/Throat: Oropharynx is clear and moist. No oropharyngeal exudate.  Eyes: Conjunctivae are normal. Right eye exhibits no discharge. Left eye exhibits no discharge. No scleral icterus.  Neck: Normal range of motion. Neck supple. No JVD present. No tracheal deviation present. No thyromegaly present.  Cardiovascular: Normal rate, regular rhythm, normal heart sounds and intact distal pulses.  Exam reveals no gallop and no friction rub.   No murmur heard. Pulmonary/Chest: Effort normal and breath sounds normal. No stridor. No respiratory distress. He has no wheezes. He has no rales. He exhibits no tenderness.  Abdominal: Soft. Bowel sounds are normal. He exhibits no distension and no mass. There is no tenderness. There is no rebound and no guarding.  Musculoskeletal: Normal range of motion. He exhibits no edema or tenderness.       Lumbar back: Normal. He exhibits normal range of motion, no tenderness, no bony tenderness, no swelling, no edema, no deformity, no laceration, no pain, no spasm and normal pulse.  Lymphadenopathy:    He has no cervical adenopathy.  Neurological: He is oriented to person, place, and time. He has normal strength and normal  reflexes. He  displays no atrophy and no tremor. No cranial nerve deficit or sensory deficit. He exhibits normal muscle tone. He displays a negative Romberg sign. He displays no seizure activity. Coordination and gait normal.  Neg SLR in BLE  Skin: Skin is warm and dry. No rash noted. He is not diaphoretic. No erythema. No pallor.  Nursing note and vitals reviewed.   Lab Results  Component Value Date   WBC 6.2 09/17/2012   HGB 16.5 09/17/2012   HCT 48.9 09/17/2012   PLT 224.0 09/17/2012   GLUCOSE 116* 12/01/2014   CHOL 184 05/13/2012   TRIG 337.0* 05/13/2012   HDL 41.70 05/13/2012   LDLDIRECT 94.3 05/13/2012   ALT 32 12/01/2014   AST 23 12/01/2014   NA 140 12/01/2014   K 4.1 12/01/2014   CL 104 12/01/2014   CREATININE 1.00 12/01/2014   BUN 11 12/01/2014   CO2 32 12/01/2014   TSH 0.72 09/17/2012   INR 1.3 01/13/2009   HGBA1C 7.4* 12/01/2014        Assessment & Plan:

## 2014-12-29 ENCOUNTER — Telehealth: Payer: Self-pay | Admitting: Internal Medicine

## 2014-12-29 DIAGNOSIS — M549 Dorsalgia, unspecified: Secondary | ICD-10-CM

## 2014-12-29 DIAGNOSIS — M1A0790 Idiopathic chronic gout, unspecified ankle and foot, without tophus (tophi): Secondary | ICD-10-CM

## 2014-12-29 DIAGNOSIS — M1000 Idiopathic gout, unspecified site: Secondary | ICD-10-CM

## 2014-12-29 MED ORDER — FEBUXOSTAT 40 MG PO TABS: 80.0000 mg | ORAL_TABLET | Freq: Every day | ORAL | Status: DC

## 2014-12-29 MED ORDER — COLCHICINE 0.6 MG PO TABS: 0.6000 mg | ORAL_TABLET | Freq: Two times a day (BID) | ORAL | Status: DC

## 2014-12-29 MED ORDER — HYDROCODONE-ACETAMINOPHEN 10-325 MG PO TABS: 1.0000 | ORAL_TABLET | Freq: Three times a day (TID) | ORAL | Status: DC | PRN

## 2014-12-29 NOTE — Telephone Encounter (Signed)
Called phone number listed x 2, line D/C. Rx placed upfront and closing phone note until pt calls back.

## 2014-12-29 NOTE — Telephone Encounter (Signed)
Pt wife called in said that he need a refill on hydrocodone and also wanted to know if Dr Yetta Barrejones will sent in meds for his gout.  It is faring up more often.     [facility]

## 2014-12-29 NOTE — Telephone Encounter (Signed)
done

## 2015-01-25 ENCOUNTER — Encounter: Payer: Managed Care, Other (non HMO) | Attending: Internal Medicine | Admitting: *Deleted

## 2015-01-25 ENCOUNTER — Encounter: Payer: Self-pay | Admitting: *Deleted

## 2015-01-25 VITALS — Ht 71.0 in | Wt 217.6 lb

## 2015-01-25 DIAGNOSIS — E119 Type 2 diabetes mellitus without complications: Secondary | ICD-10-CM | POA: Insufficient documentation

## 2015-01-25 DIAGNOSIS — Z713 Dietary counseling and surveillance: Secondary | ICD-10-CM | POA: Diagnosis not present

## 2015-01-25 NOTE — Patient Instructions (Addendum)
Plan:  Aim for 3-4 Carb Choices per meal (45-60 grams) +/- 1 either way  Aim for 0-15 Carbs per snack if hungry  Include protein in moderation with your meals and snacks Consider reading food labels for Total Carbohydrate and Fat Grams of foods Consider  increasing your activity level by walking for 30 minutes daily as tolerated Continue taking medication as directed by MD  Hotdogs on bread vrs bun Oven baked french fries Terrill MohrSara Lee 45 calorie bread decreases carbs Oatmeal as you morning snack

## 2015-01-30 ENCOUNTER — Telehealth: Payer: Self-pay | Admitting: Internal Medicine

## 2015-01-30 DIAGNOSIS — M549 Dorsalgia, unspecified: Secondary | ICD-10-CM

## 2015-01-30 MED ORDER — HYDROCODONE-ACETAMINOPHEN 10-325 MG PO TABS: 1.0000 | ORAL_TABLET | Freq: Three times a day (TID) | ORAL | Status: DC | PRN

## 2015-01-30 NOTE — Telephone Encounter (Signed)
Patient needs refill for HYDROcodone-acetaminophen (NORCO) 10-325 MG per tablet [[id number]][[id number]].

## 2015-01-30 NOTE — Telephone Encounter (Signed)
Notified pt rx ready for pick-up.../lmb 

## 2015-01-30 NOTE — Telephone Encounter (Signed)
done

## 2015-01-31 NOTE — Progress Notes (Signed)
Diabetes Self-Management Education  Visit Type:  Initial DSME  Appt. Start Time: 1630 Appt. End Time: 1800  01/31/2015  Mr. Aram CandelaRandall Grassia, identified by name and date of birth, is a 49 y.o. male with a diagnosis of Diabetes: Type 2.  Other people present during visit:  Patient, Spouse/SO   ASSESSMENT  Height 5\' 11"  (1.803 m), weight 217 lb 9.6 oz (98.703 kg). Body mass index is 30.36 kg/(m^2).  Initial Visit Information:  Are you currently following a meal plan?: No   Are you taking your medications as prescribed?: Yes Are you checking your feet?: No   How often do you need to have someone help you when you read instructions, pamphlets, or other written materials from your doctor or pharmacy?: 1 - Never What is the last grade level you completed in school?: technical school  Psychosocial:   Patient Belief/Attitude about Diabetes: Motivated to manage diabetes Self-care barriers: None Self-management support: Doctor's office, Family, CDE visits Other persons present: Patient, Spouse/SO Patient Concerns: Nutrition/Meal planning, Healthy Lifestyle, Glycemic Control, Weight Control Special Needs: None Preferred Learning Style: No preference indicated Learning Readiness: Ready  Complications:   How often do you check your blood sugar?: Not recommended by provider Fasting Blood glucose range (mg/dL): [phone] Have you had a dilated eye exam in the past 12 months?: No Have you had a dental exam in the past 12 months?: No  Diet Intake:  Breakfast: bacon, egg, toast Snack (morning): oatmeal Lunch: fish, salad, veggies, croutons Snack (afternoon): nuts/almonds/blueberries Dinner: 2 hot dogs on bread Beverage(s): regular pepsi, beer  Exercise:  Exercise: Light (walking / raking leaves) Light Exercise amount of time (min / week): 150  Individualized Plan for Diabetes Self-Management Training:   Learning Objective:  Patient will have a greater understanding of diabetes  self-management. Patient education plan per assessed needs and concerns is to attend individual sessions      Education Topics Reviewed with Patient Today:  Definition of diabetes, type 1 and 2, and the diagnosis of diabetes, Factors that contribute to the development of diabetes, Explored patient's options for treatment of their diabetes Role of diet in the treatment of diabetes and the relationship between the three main macronutrients and blood glucose level, Food label reading, portion sizes and measuring food., Carbohydrate counting, Information on hints to eating out and maintain blood glucose control., Meal options for control of blood glucose level and chronic complications. Role of exercise on diabetes management, blood pressure control and cardiac health. Yearly dilated eye exam, Daily foot exams Relationship between chronic complications and blood glucose control, Assessed and discussed foot care and prevention of foot problems  PATIENTS GOALS/Plan (Developed by the patient):  Nutrition: General guidelines for healthy choices and portions discussed Physical Activity: Exercise 5-7 days per week, 30 minutes per day Medications: take my medication as prescribed Monitoring : Not Applicable Reducing Risk: do foot checks daily, get labs drawn  Patient Instructions  Plan:  Aim for 3-4 Carb Choices per meal (45-60 grams) +/- 1 either way  Aim for 0-15 Carbs per snack if hungry  Include protein in moderation with your meals and snacks Consider reading food labels for Total Carbohydrate and Fat Grams of foods Consider  increasing your activity level by walking for 30 minutes daily as tolerated Continue taking medication as directed by MD  Hotdogs on bread vrs bun Oven baked french fries Terrill MohrSara Lee 45 calorie bread decreases carbs Oatmeal as you morning snack     Expected Outcomes:  Demonstrated interest  in learning. Expect positive outcomes  Education material provided: Living  Well with Diabetes, A1C conversion sheet, Meal plan card, My Plate and Snack sheet  If problems or questions, patient to contact team via:  Phone  Future DSME appointment: PRN

## 2015-03-01 ENCOUNTER — Telehealth: Payer: Self-pay

## 2015-03-01 DIAGNOSIS — M549 Dorsalgia, unspecified: Secondary | ICD-10-CM

## 2015-03-01 MED ORDER — TIZANIDINE HCL 4 MG PO CAPS: 4.0000 mg | ORAL_CAPSULE | Freq: Three times a day (TID) | ORAL | Status: DC

## 2015-03-01 NOTE — Telephone Encounter (Signed)
Faxed refill request for tizanidine

## 2015-03-03 ENCOUNTER — Telehealth: Payer: Self-pay | Admitting: Internal Medicine

## 2015-03-03 NOTE — Telephone Encounter (Signed)
Pt must be seen every three months for refill on pain meds. Thanks

## 2015-03-03 NOTE — Telephone Encounter (Signed)
Pt wife called in said that pt needs refill on his HYDROcodone-acetaminophen (NORCO) 10-325 MG per tablet [[id number]]

## 2015-03-09 ENCOUNTER — Ambulatory Visit (INDEPENDENT_AMBULATORY_CARE_PROVIDER_SITE_OTHER): Payer: Self-pay | Admitting: Internal Medicine

## 2015-03-09 ENCOUNTER — Encounter: Payer: Self-pay | Admitting: Internal Medicine

## 2015-03-09 ENCOUNTER — Other Ambulatory Visit (INDEPENDENT_AMBULATORY_CARE_PROVIDER_SITE_OTHER): Payer: Self-pay

## 2015-03-09 VITALS — BP 136/88 | HR 88 | Temp 98.4°F | Resp 16 | Ht 71.0 in | Wt 218.0 lb

## 2015-03-09 DIAGNOSIS — E781 Pure hyperglyceridemia: Secondary | ICD-10-CM | POA: Insufficient documentation

## 2015-03-09 DIAGNOSIS — F418 Other specified anxiety disorders: Secondary | ICD-10-CM

## 2015-03-09 DIAGNOSIS — E118 Type 2 diabetes mellitus with unspecified complications: Secondary | ICD-10-CM

## 2015-03-09 DIAGNOSIS — E785 Hyperlipidemia, unspecified: Secondary | ICD-10-CM | POA: Insufficient documentation

## 2015-03-09 DIAGNOSIS — I10 Essential (primary) hypertension: Secondary | ICD-10-CM

## 2015-03-09 DIAGNOSIS — M549 Dorsalgia, unspecified: Secondary | ICD-10-CM

## 2015-03-09 LAB — URINALYSIS, ROUTINE W REFLEX MICROSCOPIC
BILIRUBIN URINE: NEGATIVE
Ketones, ur: NEGATIVE
LEUKOCYTES UA: NEGATIVE
Nitrite: NEGATIVE
Specific Gravity, Urine: 1.025 (ref 1.000–1.030)
TOTAL PROTEIN, URINE-UPE24: NEGATIVE
Urobilinogen, UA: 0.2 (ref 0.0–1.0)
pH: 6 (ref 5.0–8.0)

## 2015-03-09 LAB — BASIC METABOLIC PANEL
BUN: 9 mg/dL (ref 6–23)
CALCIUM: 9.5 mg/dL (ref 8.4–10.5)
CO2: 30 mEq/L (ref 19–32)
Chloride: 101 mEq/L (ref 96–112)
Creatinine, Ser: 1.03 mg/dL (ref 0.40–1.50)
GFR: 81.63 mL/min (ref >60.00)
Glucose, Bld: 286 mg/dL — ABNORMAL HIGH (ref 70–99)
POTASSIUM: 4.5 meq/L (ref 3.5–5.1)
SODIUM: 137 meq/L (ref 135–145)

## 2015-03-09 LAB — MICROALBUMIN / CREATININE URINE RATIO
CREATININE, U: 158.3 mg/dL
MICROALB/CREAT RATIO: 0.7 mg/g (ref 0.0–30.0)
Microalb, Ur: 1.1 mg/dL (ref 0.0–1.9)

## 2015-03-09 LAB — HEMOGLOBIN A1C: Hgb A1c MFr Bld: 7.5 % — ABNORMAL HIGH (ref 4.6–6.5)

## 2015-03-09 LAB — LIPID PANEL
CHOLESTEROL: 172 mg/dL (ref 0–200)
HDL: 31.1 mg/dL — ABNORMAL LOW (ref >39.00)
TRIGLYCERIDES: 542 mg/dL — AB (ref 0.0–149.0)
Total CHOL/HDL Ratio: 6

## 2015-03-09 LAB — LDL CHOLESTEROL, DIRECT: Direct LDL: 81 mg/dL

## 2015-03-09 LAB — GLUCOSE, POCT (MANUAL RESULT ENTRY): POC GLUCOSE: 278 mg/dL — AB (ref 70–99)

## 2015-03-09 LAB — TSH: TSH: 1.04 u[IU]/mL (ref 0.35–4.50)

## 2015-03-09 MED ORDER — SITAGLIPTIN PHOS-METFORMIN HCL 50-1000 MG PO TABS: 1.0000 | ORAL_TABLET | Freq: Two times a day (BID) | ORAL | Status: DC

## 2015-03-09 MED ORDER — ALPRAZOLAM 0.5 MG PO TABS: 0.5000 mg | ORAL_TABLET | Freq: Two times a day (BID) | ORAL | Status: DC | PRN

## 2015-03-09 MED ORDER — TRAZODONE HCL 100 MG PO TABS: 200.0000 mg | ORAL_TABLET | Freq: Every evening | ORAL | Status: DC | PRN

## 2015-03-09 MED ORDER — ICOSAPENT ETHYL 1 G PO CAPS: 2.0000 | ORAL_CAPSULE | Freq: Two times a day (BID) | ORAL | Status: DC

## 2015-03-09 MED ORDER — ASPIRIN 81 MG PO TABS: 81.0000 mg | ORAL_TABLET | Freq: Every day | ORAL | Status: AC

## 2015-03-09 MED ORDER — HYDROCODONE-ACETAMINOPHEN 10-325 MG PO TABS: 1.0000 | ORAL_TABLET | Freq: Three times a day (TID) | ORAL | Status: DC | PRN

## 2015-03-09 NOTE — Patient Instructions (Signed)

## 2015-03-09 NOTE — Assessment & Plan Note (Signed)
His BP is well controlled Lytes and renal function are stable 

## 2015-03-09 NOTE — Progress Notes (Signed)
Subjective:  Patient ID: [name], male    DOB: 17-Jun-1966  Age: 49 y.o. MRN: [id number]  CC: Hypertension and Diabetes   HPI [name] presents for follow up on multiple medical problems, he has no new or different or worsening complaints today.  Outpatient Prescriptions Prior to Visit  Medication Sig Dispense Refill  . colchicine 0.6 MG tablet Take 1 tablet (0.6 mg total) by mouth 2 (two) times daily. 180 tablet 1  . escitalopram (LEXAPRO) 20 MG tablet     . febuxostat (ULORIC) 40 MG tablet Take 2 tablets (80 mg total) by mouth daily. 90 tablet 3  . metoprolol succinate (TOPROL-XL) 100 MG 24 hr tablet Take 2 tablets (200 mg total) by mouth daily. Take with or immediately following a meal. 180 tablet 2  . tiZANidine (ZANAFLEX) 4 MG capsule Take 1 capsule (4 mg total) by mouth 3 (three) times daily. 270 capsule 2  . ALPRAZolam (XANAX) 0.5 MG tablet     . HYDROcodone-acetaminophen (NORCO) 10-325 MG per tablet Take 1 tablet by mouth every 8 (eight) hours as needed. 90 tablet 0  . traZODone (DESYREL) 100 MG tablet Take 1 tablet (100 mg total) by mouth at bedtime as needed for sleep. 90 tablet 3   No facility-administered medications prior to visit.    ROS Review of Systems  Constitutional: Positive for fatigue. Negative for fever, chills, diaphoresis, activity change, appetite change and unexpected weight change.  HENT: Negative.   Eyes: Negative.   Respiratory: Negative.  Negative for cough, choking, chest tightness, shortness of breath, wheezing and stridor.   Cardiovascular: Negative.  Negative for chest pain, palpitations and leg swelling.  Gastrointestinal: Negative.  Negative for nausea, abdominal pain, diarrhea, constipation and blood in stool.  Endocrine: Negative.  Negative for polydipsia, polyphagia and polyuria.  Genitourinary: Negative.   Musculoskeletal: Positive for back pain and neck pain. Negative for myalgias, joint swelling, arthralgias, gait problem and  neck stiffness.  Skin: Negative.  Negative for rash.  Allergic/Immunologic: Negative.   Neurological: Negative.  Negative for dizziness, tremors, seizures, syncope, light-headedness, numbness and headaches.  Hematological: Negative.  Negative for adenopathy. Does not bruise/bleed easily.  Psychiatric/Behavioral: Positive for sleep disturbance. Negative for suicidal ideas, hallucinations, behavioral problems, confusion, self-injury, dysphoric mood, decreased concentration and agitation. The patient is nervous/anxious. The patient is not hyperactive.     Objective:  BP 136/88 mmHg  Pulse 88  Temp(Src) 98.4 F (36.9 C) (Oral)  Resp 16  Ht  (1.803 m)  Wt 218 lb (98.884 kg)  BMI 30.42 kg/m2  SpO2 95%  BP Readings from Last 3 Encounters:  03/09/15 136/88  12/01/14 120/84  10/27/14 120/80    Wt Readings from Last 3 Encounters:  03/09/15 218 lb (98.884 kg)  01/25/15 217 lb 9.6 oz (98.703 kg)  12/01/14 222 lb (100.699 kg)    Physical Exam  Constitutional: He is oriented to person, place, and time. He appears well-developed and well-nourished. No distress.  HENT:  Head: Normocephalic and atraumatic.  Mouth/Throat: Oropharynx is clear and moist. No oropharyngeal exudate.  Eyes: Conjunctivae are normal. Right eye exhibits no discharge. Left eye exhibits no discharge. No scleral icterus.  Neck: Normal range of motion. Neck supple. No JVD present. No tracheal deviation present. No thyromegaly present.  Cardiovascular: Normal rate, regular rhythm, normal heart sounds and intact distal pulses.  Exam reveals no gallop and no friction rub.   No murmur heard. Pulmonary/Chest: Effort normal and breath sounds normal. No stridor. No respiratory  distress. He has no wheezes. He has no rales. He exhibits no tenderness.  Abdominal: Soft. Bowel sounds are normal. He exhibits no distension and no mass. There is no tenderness. There is no rebound and no guarding.  Musculoskeletal: Normal range of  motion. He exhibits no edema or tenderness.       Cervical back: Normal.       Lumbar back: Normal.  Lymphadenopathy:    He has no cervical adenopathy.  Neurological: He is oriented to person, place, and time.  Skin: Skin is warm and dry. No rash noted. He is not diaphoretic. No erythema. No pallor.  Psychiatric: He has a normal mood and affect. His behavior is normal. Judgment and thought content normal.  Vitals reviewed.   Lab Results  Component Value Date   WBC 6.2 09/17/2012   HGB 16.5 09/17/2012   HCT 48.9 09/17/2012   PLT 224.0 09/17/2012   GLUCOSE 286* 03/09/2015   CHOL 172 03/09/2015   TRIG 542.0* 03/09/2015   HDL 31.10* 03/09/2015   LDLDIRECT 81.0 03/09/2015   ALT 32 12/01/2014   AST 23 12/01/2014   NA 137 03/09/2015   K 4.5 03/09/2015   CL 101 03/09/2015   CREATININE 1.03 03/09/2015   BUN 9 03/09/2015   CO2 30 03/09/2015   TSH 1.04 03/09/2015   INR 1.3 01/13/2009   HGBA1C 7.5* 03/09/2015   MICROALBUR 1.1 03/09/2015    Dg Lumbar Spine Complete  12/02/2014   CLINICAL DATA:  Low back pain with left-sided sciatica for 2 months  EXAM: LUMBAR SPINE - COMPLETE 4+ VIEW  COMPARISON:  None.  FINDINGS: Frontal, lateral, spot lumbosacral lateral, bilateral oblique views were obtained. There are 6 non-rib-bearing lumbar type vertebral bodies. There is lower lumbar levoscoliosis. There is postoperative change at L6-S1. There is no fracture or spondylolisthesis. There is moderate disc space narrowing at L6-S1. There is slightly milder narrowing at L4-5 and L5-6. 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  IMPRESSION: Postoperative change and osteoarthritic change. Mild levoscoliosis. No fracture or spondylolisthesis.   Electronically Signed   By: Bretta BangWilliam  Woodruff III M.D.   On: 12/02/2014 08:32    Assessment & Plan:   Brynda GreathouseRandall was seen today for hypertension and diabetes.  Diagnoses and all orders for this visit:  Essential hypertension, benign  Type II  diabetes mellitus with manifestations - will start treating this with janumet   I have changed Mr. Roher's traZODone and ALPRAZolam. I am also having him start on sitaGLIPtin-metformin, aspirin, and Icosapent Ethyl. Additionally, I am having him maintain his escitalopram, metoprolol succinate, colchicine, febuxostat, tiZANidine, and HYDROcodone-acetaminophen.  Meds ordered this encounter  Medications  . traZODone (DESYREL) 100 MG tablet    Sig: Take 2 tablets (200 mg total) by mouth at bedtime as needed for sleep.    Dispense:  180 tablet    Refill:  1  . ALPRAZolam (XANAX) 0.5 MG tablet    Sig: Take 1 tablet (0.5 mg total) by mouth 2 (two) times daily as needed for anxiety.    Dispense:  60 tablet    Refill:  2  . sitaGLIPtin-metformin (JANUMET) 50-1000 MG per tablet    Sig: Take 1 tablet by mouth 2 (two) times daily with a meal.    Dispense:  180 tablet    Refill:  1  . aspirin 81 MG tablet    Sig: Take 1 tablet (81 mg total) by mouth daily.    Dispense:  30 tablet    Refill:  11  . HYDROcodone-acetaminophen (NORCO) 10-325 MG per tablet    Sig: Take 1 tablet by mouth every 8 (eight) hours as needed.    Dispense:  90 tablet    Refill:  0  . Icosapent Ethyl (VASCEPA) 1 G CAPS    Sig: Take 2 capsules by mouth 2 (two) times daily.    Dispense:  120 capsule    Refill:  11     Follow-up: Return in about 4 months (around 07/10/2015).  Sanda Linger, MD

## 2015-03-09 NOTE — Assessment & Plan Note (Signed)
Will increase the dose of trazodone Will cont xanax as needed

## 2015-03-09 NOTE — Assessment & Plan Note (Signed)
Will treat this with vascepa He agrees to work on his lifestyle modifications

## 2015-03-09 NOTE — Progress Notes (Signed)
Pre visit review using our clinic review tool, if applicable. No additional management support is needed unless otherwise documented below in the visit note. 

## 2015-03-17 ENCOUNTER — Encounter: Payer: Self-pay | Admitting: Family Medicine

## 2015-03-23 ENCOUNTER — Encounter: Payer: Self-pay | Admitting: Internal Medicine

## 2015-04-20 ENCOUNTER — Telehealth: Payer: Self-pay | Admitting: Internal Medicine

## 2015-04-20 DIAGNOSIS — M549 Dorsalgia, unspecified: Secondary | ICD-10-CM

## 2015-04-20 MED ORDER — HYDROCODONE-ACETAMINOPHEN 10-325 MG PO TABS: 1.0000 | ORAL_TABLET | Freq: Three times a day (TID) | ORAL | Status: DC | PRN

## 2015-04-20 NOTE — Telephone Encounter (Signed)
Patient need refill of medication hydrocodone, and he would like to know if he could change medication fromTizanidine 4 mg to flexeril, please advise

## 2015-04-21 NOTE — Telephone Encounter (Signed)
Pt picked up script for hydrocodone but would still like to ask PCP iif he could change medication fromTizanidine 4 mg to flexeril. Thanks

## 2015-04-21 NOTE — Telephone Encounter (Signed)
Returned call// not able to leave voice mail due to not being set up. Rx ready and placed upfront.

## 2015-04-21 NOTE — Telephone Encounter (Signed)
Patient is totally out of medication and is post surgery.  Since dr Yetta Barrejones has not signed the hydrocodone script yet, is it possible to have another provider to fill for today ? Please notify Bufford Lopeaudra

## 2015-04-23 NOTE — Telephone Encounter (Signed)
I prefer that he stay on tizanidine

## 2015-04-24 NOTE — Telephone Encounter (Signed)
Returned call back to pt, no answer or vm and will try again later.

## 2015-06-09 ENCOUNTER — Other Ambulatory Visit: Payer: Self-pay | Admitting: Internal Medicine

## 2015-06-09 DIAGNOSIS — M549 Dorsalgia, unspecified: Secondary | ICD-10-CM

## 2015-06-09 MED ORDER — HYDROCODONE-ACETAMINOPHEN 10-325 MG PO TABS: 1.0000 | ORAL_TABLET | Freq: Three times a day (TID) | ORAL | Status: DC | PRN

## 2015-06-09 NOTE — Telephone Encounter (Signed)
Patient requesting refill for HYDROcodone-acetaminophen (NORCO) 10-325 MG per tablet [[id number]]

## 2015-06-09 NOTE — Telephone Encounter (Signed)
Done hardcopy to Dahlia  

## 2015-06-09 NOTE — Telephone Encounter (Signed)
Ok to RF? Dr. Yetta Barre out of office. Last OV 03/09/15

## 2015-06-12 MED ORDER — HYDROCODONE-ACETAMINOPHEN 10-325 MG PO TABS: 1.0000 | ORAL_TABLET | Freq: Three times a day (TID) | ORAL | Status: DC | PRN

## 2015-06-12 NOTE — Telephone Encounter (Signed)
New prescription printed. Please discard old one.

## 2015-06-12 NOTE — Telephone Encounter (Signed)
Hardcopy missing in Ov. Ok to reprint? Thank you

## 2015-07-13 ENCOUNTER — Telehealth: Payer: Self-pay | Admitting: *Deleted

## 2015-07-13 NOTE — Telephone Encounter (Signed)
He is due for an OV 

## 2015-07-13 NOTE — Telephone Encounter (Signed)
Receive call pt is requesting refill on his pain med hydrocodone..../LMB

## 2015-07-14 NOTE — Telephone Encounter (Signed)
Called pt spoke with wife gave md response. Made appt for 07/25/15 @ 3:30...[name]

## 2015-07-25 ENCOUNTER — Encounter: Payer: Self-pay | Admitting: Internal Medicine

## 2015-07-25 ENCOUNTER — Ambulatory Visit (INDEPENDENT_AMBULATORY_CARE_PROVIDER_SITE_OTHER): Payer: Self-pay | Admitting: Internal Medicine

## 2015-07-25 ENCOUNTER — Other Ambulatory Visit (INDEPENDENT_AMBULATORY_CARE_PROVIDER_SITE_OTHER): Payer: Self-pay

## 2015-07-25 VITALS — BP 118/88 | HR 79 | Temp 98.5°F | Resp 16 | Ht 71.0 in | Wt 201.0 lb

## 2015-07-25 DIAGNOSIS — E785 Hyperlipidemia, unspecified: Secondary | ICD-10-CM

## 2015-07-25 DIAGNOSIS — H9190 Unspecified hearing loss, unspecified ear: Secondary | ICD-10-CM | POA: Insufficient documentation

## 2015-07-25 DIAGNOSIS — H9193 Unspecified hearing loss, bilateral: Secondary | ICD-10-CM

## 2015-07-25 DIAGNOSIS — M1000 Idiopathic gout, unspecified site: Secondary | ICD-10-CM

## 2015-07-25 DIAGNOSIS — M1A0790 Idiopathic chronic gout, unspecified ankle and foot, without tophus (tophi): Secondary | ICD-10-CM

## 2015-07-25 DIAGNOSIS — I10 Essential (primary) hypertension: Secondary | ICD-10-CM

## 2015-07-25 DIAGNOSIS — E118 Type 2 diabetes mellitus with unspecified complications: Secondary | ICD-10-CM

## 2015-07-25 DIAGNOSIS — M549 Dorsalgia, unspecified: Secondary | ICD-10-CM

## 2015-07-25 DIAGNOSIS — F418 Other specified anxiety disorders: Secondary | ICD-10-CM

## 2015-07-25 DIAGNOSIS — E781 Pure hyperglyceridemia: Secondary | ICD-10-CM

## 2015-07-25 LAB — HEMOGLOBIN A1C: HEMOGLOBIN A1C: 12.6 % — AB (ref 4.6–6.5)

## 2015-07-25 MED ORDER — ALPRAZOLAM 0.5 MG PO TABS: 0.5000 mg | ORAL_TABLET | Freq: Two times a day (BID) | ORAL | Status: DC | PRN

## 2015-07-25 MED ORDER — FEBUXOSTAT 40 MG PO TABS: 80.0000 mg | ORAL_TABLET | Freq: Every day | ORAL | Status: DC

## 2015-07-25 MED ORDER — METOPROLOL SUCCINATE ER 100 MG PO TB24: 200.0000 mg | ORAL_TABLET | Freq: Every day | ORAL | Status: DC

## 2015-07-25 MED ORDER — TIZANIDINE HCL 4 MG PO CAPS: 4.0000 mg | ORAL_CAPSULE | Freq: Three times a day (TID) | ORAL | Status: DC

## 2015-07-25 MED ORDER — COLCHICINE 0.6 MG PO TABS: 0.6000 mg | ORAL_TABLET | Freq: Two times a day (BID) | ORAL | Status: DC

## 2015-07-25 MED ORDER — HYDROCODONE-ACETAMINOPHEN 10-325 MG PO TABS: 1.0000 | ORAL_TABLET | Freq: Three times a day (TID) | ORAL | Status: DC | PRN

## 2015-07-25 MED ORDER — TRAZODONE HCL 100 MG PO TABS: 200.0000 mg | ORAL_TABLET | Freq: Every evening | ORAL | Status: DC | PRN

## 2015-07-25 NOTE — Progress Notes (Signed)
Subjective:  Patient ID: [name], male    DOB: 12-Sep-1966  Age: 49 y.o. MRN: [id number]  CC: Back Pain; Hyperlipidemia; Hypertension; and Diabetes   HPI [name] presents for follow up on several medical concerns. He has persistent LBP that occasionally radiates into his left hip area. The pain has not worsened or changed over the last 3 months. He has persistent N/W in the left leg that started after low back surgery over 5 years ago. Norco controls his pain. His blood sugars have not been well controlled. He does not have medical insurance and has not been able to afford most of his meds.  Outpatient Prescriptions Prior to Visit  Medication Sig Dispense Refill  . aspirin 81 MG tablet Take 1 tablet (81 mg total) by mouth daily. 30 tablet 11  . escitalopram (LEXAPRO) 20 MG tablet     . Icosapent Ethyl (VASCEPA) 1 G CAPS Take 2 capsules by mouth 2 (two) times daily. 120 capsule 11  . sitaGLIPtin-metformin (JANUMET) 50-1000 MG per tablet Take 1 tablet by mouth 2 (two) times daily with a meal. 180 tablet 1  . ALPRAZolam (XANAX) 0.5 MG tablet Take 1 tablet (0.5 mg total) by mouth 2 (two) times daily as needed for anxiety. 60 tablet 2  . colchicine 0.6 MG tablet Take 1 tablet (0.6 mg total) by mouth 2 (two) times daily. 180 tablet 1  . febuxostat (ULORIC) 40 MG tablet Take 2 tablets (80 mg total) by mouth daily. 90 tablet 3  . HYDROcodone-acetaminophen (NORCO) 10-325 MG per tablet Take 1 tablet by mouth every 8 (eight) hours as needed. 90 tablet 0  . metoprolol succinate (TOPROL-XL) 100 MG 24 hr tablet Take 2 tablets (200 mg total) by mouth daily. Take with or immediately following a meal. 180 tablet 2  . tiZANidine (ZANAFLEX) 4 MG capsule Take 1 capsule (4 mg total) by mouth 3 (three) times daily. 270 capsule 2  . traZODone (DESYREL) 100 MG tablet Take 2 tablets (200 mg total) by mouth at bedtime as needed for sleep. 180 tablet 1   No facility-administered medications prior to  visit.    ROS Review of Systems  Constitutional: Positive for unexpected weight change (wt loss, intentional). Negative for fever, chills, diaphoresis, appetite change and fatigue.  HENT: Positive for hearing loss (right side). Negative for congestion, ear discharge, ear pain, postnasal drip, sinus pressure, sneezing, sore throat and tinnitus.   Eyes: Negative.   Respiratory: Negative.  Negative for cough, choking, chest tightness, shortness of breath and stridor.   Cardiovascular: Negative.  Negative for chest pain, palpitations and leg swelling.  Gastrointestinal: Negative.  Negative for nausea, vomiting, abdominal pain, diarrhea, constipation and blood in stool.  Endocrine: Positive for polyuria. Negative for polydipsia and polyphagia.  Genitourinary: Negative.  Negative for difficulty urinating.  Musculoskeletal: Positive for back pain. Negative for myalgias, joint swelling, arthralgias, gait problem, neck pain and neck stiffness.  Skin: Negative.  Negative for rash.  Allergic/Immunologic: Negative.   Neurological: Positive for weakness and numbness. Negative for dizziness.  Hematological: Negative.  Negative for adenopathy. Does not bruise/bleed easily.  Psychiatric/Behavioral: Positive for sleep disturbance. Negative for suicidal ideas, hallucinations, behavioral problems, confusion, self-injury, dysphoric mood, decreased concentration and agitation. The patient is nervous/anxious. The patient is not hyperactive.     Objective:  BP 118/88 mmHg  Pulse 79  Temp(Src) 98.5 F (36.9 C) (Oral)  Resp 16  Ht  (1.803 m)  Wt 201 lb (91.173 kg)  BMI 28.05  kg/m2  SpO2 98%  BP Readings from Last 3 Encounters:  07/25/15 118/88  03/09/15 136/88  12/01/14 120/84    Wt Readings from Last 3 Encounters:  07/25/15 201 lb (91.173 kg)  03/09/15 218 lb (98.884 kg)  01/25/15 217 lb 9.6 oz (98.703 kg)    Physical Exam  Constitutional: He is oriented to person, place, and time. No  distress.  HENT:  Head: Normocephalic and atraumatic.  Right Ear: Hearing, tympanic membrane, external ear and ear canal normal.  Left Ear: Hearing, tympanic membrane, external ear and ear canal normal.  Mouth/Throat: Oropharynx is clear and moist. No oropharyngeal exudate.  Eyes: Conjunctivae are normal. Right eye exhibits no discharge. Left eye exhibits no discharge. No scleral icterus.  Neck: Normal range of motion. Neck supple. No JVD present. No tracheal deviation present. No thyromegaly present.  Cardiovascular: Normal rate, regular rhythm, normal heart sounds and intact distal pulses.  Exam reveals no gallop and no friction rub.   No murmur heard. Pulmonary/Chest: Effort normal and breath sounds normal. No stridor. No respiratory distress. He has no wheezes. He has no rales. He exhibits no tenderness.  Abdominal: Soft. Bowel sounds are normal. He exhibits no distension and no mass. There is no tenderness. There is no rebound and no guarding.  Musculoskeletal: Normal range of motion. He exhibits no edema or tenderness.  Lymphadenopathy:    He has no cervical adenopathy.  Neurological: He is alert and oriented to person, place, and time. He has normal strength. He displays no atrophy, no tremor and normal reflexes. No cranial nerve deficit or sensory deficit. He exhibits normal muscle tone. He displays a negative Romberg sign. He displays no seizure activity. Coordination and gait normal.  Reflex Scores:      Tricep reflexes are 0 on the right side and 0 on the left side.      Bicep reflexes are 0 on the right side and 0 on the left side.      Brachioradialis reflexes are 0 on the right side and 0 on the left side.      Patellar reflexes are 0 on the right side and 0 on the left side.      Achilles reflexes are 0 on the right side and 0 on the left side. Neg SLR in BLE  Skin: Skin is warm and dry. No rash noted. He is not diaphoretic. No erythema. No pallor.  Psychiatric: He has a normal  mood and affect. His behavior is normal. Judgment and thought content normal.  Vitals reviewed.   Lab Results  Component Value Date   WBC 6.2 09/17/2012   HGB 16.5 09/17/2012   HCT 48.9 09/17/2012   PLT 224.0 09/17/2012   GLUCOSE 267* 07/25/2015   CHOL 209* 07/25/2015   TRIG * 07/25/2015    698.0 Triglyceride is over 400; calculations on Lipids are invalid.   HDL 36.30* 07/25/2015   LDLDIRECT 79.0 07/25/2015   ALT 32 12/01/2014   AST 23 12/01/2014   NA 137 07/25/2015   K 4.5 07/25/2015   CL 97 07/25/2015   CREATININE 0.95 07/25/2015   BUN 12 07/25/2015   CO2 30 07/25/2015   TSH 1.04 03/09/2015   INR 1.3 01/13/2009   HGBA1C 12.6* 07/25/2015   MICROALBUR 1.1 03/09/2015    Dg Lumbar Spine Complete  12/02/2014   CLINICAL DATA:  Low back pain with left-sided sciatica for 2 months  EXAM: LUMBAR SPINE - COMPLETE 4+ VIEW  COMPARISON:  None.  FINDINGS: Frontal,  lateral, spot lumbosacral lateral, bilateral oblique views were obtained. There are 6 non-rib-bearing lumbar type vertebral bodies. There is lower lumbar levoscoliosis. There is postoperative change at L6-S1. There is no fracture or spondylolisthesis. There is moderate disc space narrowing at L6-S1. There is slightly milder narrowing at L4-5 and L5-6. 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  IMPRESSION: Postoperative change and osteoarthritic change. Mild levoscoliosis. No fracture or spondylolisthesis.   Electronically Signed   By: Bretta Bang III M.D.   On: 12/02/2014 08:32    Assessment & Plan:   Ean was seen today for back pain, hyperlipidemia, hypertension and diabetes.  Diagnoses and all orders for this visit:  Essential hypertension, benign -     metoprolol succinate (TOPROL-XL) 100 MG 24 hr tablet; Take 2 tablets (200 mg total) by mouth daily. Take with or immediately following a meal. -     Basic metabolic panel; Future  Back pain without radiation- he reports radicular symptoms but his  neurologic exam is normal. He is not willing to consider physical therapy or any additional x-rays or surgical interventions. For now will continue controlling his pain with Norco and Zanaflex. -     tiZANidine (ZANAFLEX) 4 MG capsule; Take 1 capsule (4 mg total) by mouth 3 (three) times daily. -     HYDROcodone-acetaminophen (NORCO) 10-325 MG tablet; Take 1 tablet by mouth every 8 (eight) hours as needed.  Depression with anxiety -     traZODone (DESYREL) 100 MG tablet; Take 2 tablets (200 mg total) by mouth at bedtime as needed for sleep. -     ALPRAZolam (XANAX) 0.5 MG tablet; Take 1 tablet (0.5 mg total) by mouth 2 (two) times daily as needed for anxiety.  Idiopathic chronic gout of foot without tophus, unspecified laterality -     febuxostat (ULORIC) 40 MG tablet; Take 2 tablets (80 mg total) by mouth daily. -     colchicine 0.6 MG tablet; Take 1 tablet (0.6 mg total) by mouth 2 (two) times daily. -     Uric acid; Future  Idiopathic gout, unspecified chronicity, unspecified site- he has not achieved his uric acid goal of 6, I've asked him to restart [facility] [place]. -     febuxostat (ULORIC) 40 MG tablet; Take 2 tablets (80 mg total) by mouth daily. -     colchicine 0.6 MG tablet; Take 1 tablet (0.6 mg total) by mouth 2 (two) times daily. -     Uric acid; Future  Type 2 diabetes mellitus with complication, without long-term current use of insulin (HCC)- his A1c is up to 12.6%. I have asked him to restart Janumet and will also add for seek and a dose of basal insulin. I have asked him to have an annual eye exam done and to see diabetic education. -     Hemoglobin A1c; Future -     Basic metabolic panel; Future -     Ambulatory referral to Ophthalmology  Hyperlipidemia with target LDL less than 100- he has achieved his LDL goal. -     Lipid panel; Future  Hearing loss, bilateral -     Ambulatory referral to Audiology  Hypertriglyceridemia, essential- this is not very well controlled, I have  asked him to improve his lifestyle modifications and to start the omega-3 fish oils that were previously prescribed. Will also add a daily dose of basal and insulin to lower his triglycerides.   I am having [name] start on dapagliflozin propanediol, Insulin Glargine,  and Insulin Pen Needle. I am also having him maintain his escitalopram, sitaGLIPtin-metformin, aspirin, Icosapent Ethyl, metoprolol succinate, tiZANidine, traZODone, febuxostat, colchicine, ALPRAZolam, and HYDROcodone-acetaminophen.  Meds ordered this encounter  Medications  . metoprolol succinate (TOPROL-XL) 100 MG 24 hr tablet    Sig: Take 2 tablets (200 mg total) by mouth daily. Take with or immediately following a meal.    Dispense:  180 tablet    Refill:  2  . tiZANidine (ZANAFLEX) 4 MG capsule    Sig: Take 1 capsule (4 mg total) by mouth 3 (three) times daily.    Dispense:  270 capsule    Refill:  2  . traZODone (DESYREL) 100 MG tablet    Sig: Take 2 tablets (200 mg total) by mouth at bedtime as needed for sleep.    Dispense:  180 tablet    Refill:  1  . febuxostat (ULORIC) 40 MG tablet    Sig: Take 2 tablets (80 mg total) by mouth daily.    Dispense:  90 tablet    Refill:  3  . colchicine 0.6 MG tablet    Sig: Take 1 tablet (0.6 mg total) by mouth 2 (two) times daily.    Dispense:  180 tablet    Refill:  1  . ALPRAZolam (XANAX) 0.5 MG tablet    Sig: Take 1 tablet (0.5 mg total) by mouth 2 (two) times daily as needed for anxiety.    Dispense:  60 tablet    Refill:  2  . DISCONTD: HYDROcodone-acetaminophen (NORCO) 10-325 MG tablet    Sig: Take 1 tablet by mouth every 8 (eight) hours as needed.    Dispense:  90 tablet    Refill:  0  . HYDROcodone-acetaminophen (NORCO) 10-325 MG tablet    Sig: Take 1 tablet by mouth every 8 (eight) hours as needed.    Dispense:  90 tablet    Refill:  0    Fill on or after 08/25/15  . dapagliflozin propanediol (FARXIGA) 5 MG TABS tablet    Sig: Take 5 mg by mouth daily.     Dispense:  30 tablet    Refill:  11  . Insulin Glargine (TOUJEO SOLOSTAR) 300 UNIT/ML SOPN    Sig: Inject 30 Units into the skin daily.    Dispense:  1.5 mL    Refill:  11  . Insulin Pen Needle (NOVOFINE) 32G X 6 MM MISC    Sig: 1 Act by Does not apply route daily. Use 1 QD    Dispense:  100 each    Refill:  3     Follow-up: Return in about 4 months (around 11/25/2015).  Sanda Linger, MD

## 2015-07-25 NOTE — Progress Notes (Signed)
Pre visit review using our clinic review tool, if applicable. No additional management support is needed unless otherwise documented below in the visit note. 

## 2015-07-26 ENCOUNTER — Encounter: Payer: Self-pay | Admitting: Internal Medicine

## 2015-07-26 LAB — BASIC METABOLIC PANEL
BUN: 12 mg/dL (ref 6–23)
CALCIUM: 9.8 mg/dL (ref 8.4–10.5)
CHLORIDE: 97 meq/L (ref 96–112)
CO2: 30 mEq/L (ref 19–32)
CREATININE: 0.95 mg/dL (ref 0.40–1.50)
GFR: 89.48 mL/min (ref >60.00)
Glucose, Bld: 267 mg/dL — ABNORMAL HIGH (ref 70–99)
Potassium: 4.5 mEq/L (ref 3.5–5.1)
Sodium: 137 mEq/L (ref 135–145)

## 2015-07-26 LAB — LIPID PANEL
CHOL/HDL RATIO: 6
CHOLESTEROL: 209 mg/dL — AB (ref 0–200)
HDL: 36.3 mg/dL — AB (ref >39.00)

## 2015-07-26 LAB — LDL CHOLESTEROL, DIRECT: LDL DIRECT: 79 mg/dL

## 2015-07-26 LAB — URIC ACID: URIC ACID, SERUM: 7.7 mg/dL (ref 4.0–7.8)

## 2015-07-26 MED ORDER — INSULIN GLARGINE 300 UNIT/ML ~~LOC~~ SOPN: 30.0000 [IU] | PEN_INJECTOR | Freq: Every day | SUBCUTANEOUS | Status: DC

## 2015-07-26 MED ORDER — DAPAGLIFLOZIN PROPANEDIOL 5 MG PO TABS: 5.0000 mg | ORAL_TABLET | Freq: Every day | ORAL | Status: DC

## 2015-07-26 MED ORDER — INSULIN PEN NEEDLE 32G X 6 MM MISC: 1.0000 | Freq: Every day | Status: DC

## 2015-08-10 ENCOUNTER — Telehealth: Payer: Self-pay | Admitting: *Deleted

## 2015-08-10 NOTE — Telephone Encounter (Addendum)
Opened in error

## 2015-08-29 ENCOUNTER — Encounter: Payer: Self-pay | Admitting: Internal Medicine

## 2015-09-21 ENCOUNTER — Other Ambulatory Visit: Payer: Self-pay | Admitting: Internal Medicine

## 2015-09-21 ENCOUNTER — Telehealth: Payer: Self-pay | Admitting: Internal Medicine

## 2015-09-21 DIAGNOSIS — M549 Dorsalgia, unspecified: Secondary | ICD-10-CM

## 2015-09-21 MED ORDER — HYDROCODONE-ACETAMINOPHEN 10-325 MG PO TABS: 1.0000 | ORAL_TABLET | Freq: Three times a day (TID) | ORAL | Status: DC | PRN

## 2015-09-21 NOTE — Telephone Encounter (Signed)
Please advise 

## 2015-09-21 NOTE — Telephone Encounter (Signed)
I do not prescribe Soma Norco Rx written Will leave janumet samples with the RX

## 2015-09-21 NOTE — Telephone Encounter (Signed)
Could not reach patient.  There is no VM's.  Script for hydrocodone and janumet samples up front.

## 2015-09-21 NOTE — Telephone Encounter (Signed)
Pt requesting refill for HYDROcodone-acetaminophen (NORCO) 10-325 MG tablet [[id number]][[id number]]  He states he's been taking muscle relaxer Soma and its been working real well for him and is wondering if you can prescribe that for him. He's also wondering if he can get more samples of the Janumet because he still is not working. His best number is [phone] or his wife's cell [phone]

## 2015-09-22 NOTE — Telephone Encounter (Signed)
Received call pt calling checking on msg left yesterday. Gave md response below...Raechel Chute/lmb

## 2015-10-23 NOTE — Telephone Encounter (Signed)
Pt called triage again today regarding same below.

## 2015-10-24 MED ORDER — HYDROCODONE-ACETAMINOPHEN 10-325 MG PO TABS: 1.0000 | ORAL_TABLET | Freq: Three times a day (TID) | ORAL | Status: DC | PRN

## 2015-10-24 NOTE — Addendum Note (Signed)
Addended by: Etta GrandchildJONES, Ayala Ribble L on: 10/24/2015 11:38 AM   Modules accepted: Orders

## 2015-10-24 NOTE — Telephone Encounter (Signed)
Put up front for pt in cabinet

## 2015-10-24 NOTE — Telephone Encounter (Signed)
done

## 2015-10-24 NOTE — Telephone Encounter (Signed)
Please advise 

## 2015-11-27 ENCOUNTER — Other Ambulatory Visit (INDEPENDENT_AMBULATORY_CARE_PROVIDER_SITE_OTHER): Payer: Self-pay

## 2015-11-27 ENCOUNTER — Encounter: Payer: Self-pay | Admitting: Internal Medicine

## 2015-11-27 ENCOUNTER — Ambulatory Visit (INDEPENDENT_AMBULATORY_CARE_PROVIDER_SITE_OTHER): Payer: Self-pay | Admitting: Internal Medicine

## 2015-11-27 VITALS — BP 130/98 | HR 104 | Temp 98.3°F | Resp 16 | Ht 71.0 in | Wt 215.0 lb

## 2015-11-27 DIAGNOSIS — E781 Pure hyperglyceridemia: Secondary | ICD-10-CM

## 2015-11-27 DIAGNOSIS — F418 Other specified anxiety disorders: Secondary | ICD-10-CM

## 2015-11-27 DIAGNOSIS — E118 Type 2 diabetes mellitus with unspecified complications: Secondary | ICD-10-CM

## 2015-11-27 DIAGNOSIS — M549 Dorsalgia, unspecified: Secondary | ICD-10-CM

## 2015-11-27 DIAGNOSIS — I10 Essential (primary) hypertension: Secondary | ICD-10-CM

## 2015-11-27 DIAGNOSIS — M1000 Idiopathic gout, unspecified site: Secondary | ICD-10-CM

## 2015-11-27 DIAGNOSIS — E785 Hyperlipidemia, unspecified: Secondary | ICD-10-CM

## 2015-11-27 DIAGNOSIS — M1A0790 Idiopathic chronic gout, unspecified ankle and foot, without tophus (tophi): Secondary | ICD-10-CM

## 2015-11-27 LAB — GLUCOSE, POCT (MANUAL RESULT ENTRY): POC GLUCOSE: 141 mg/dL — AB (ref 70–99)

## 2015-11-27 LAB — CBC WITH DIFFERENTIAL/PLATELET
BASOS ABS: 0 10*3/uL (ref 0.0–0.1)
Basophils Relative: 0.2 % (ref 0.0–3.0)
EOS ABS: 0.3 10*3/uL (ref 0.0–0.7)
Eosinophils Relative: 3.4 % (ref 0.0–5.0)
HCT: 49.4 % (ref 39.0–52.0)
Hemoglobin: 16.8 g/dL (ref 13.0–17.0)
LYMPHS ABS: 2.2 10*3/uL (ref 0.7–4.0)
Lymphocytes Relative: 24.7 % (ref 12.0–46.0)
MCHC: 34.1 g/dL (ref 30.0–36.0)
MCV: 92.4 fl (ref 78.0–100.0)
MONOS PCT: 6 % (ref 3.0–12.0)
Monocytes Absolute: 0.5 10*3/uL (ref 0.1–1.0)
NEUTROS PCT: 65.7 % (ref 43.0–77.0)
Neutro Abs: 5.9 10*3/uL (ref 1.4–7.7)
Platelets: 220 10*3/uL (ref 150.0–400.0)
RBC: 5.35 Mil/uL (ref 4.22–5.81)
RDW: 13 % (ref 11.5–15.5)
WBC: 8.9 10*3/uL (ref 4.0–10.5)

## 2015-11-27 MED ORDER — ALLOPURINOL 100 MG PO TABS: 100.0000 mg | ORAL_TABLET | Freq: Every day | ORAL | Status: DC

## 2015-11-27 MED ORDER — HYDROCODONE-ACETAMINOPHEN 10-325 MG PO TABS: 1.0000 | ORAL_TABLET | Freq: Three times a day (TID) | ORAL | Status: DC | PRN

## 2015-11-27 MED ORDER — ALPRAZOLAM 0.5 MG PO TABS: 0.5000 mg | ORAL_TABLET | Freq: Two times a day (BID) | ORAL | Status: DC | PRN

## 2015-11-27 MED ORDER — TIZANIDINE HCL 4 MG PO CAPS: 4.0000 mg | ORAL_CAPSULE | Freq: Three times a day (TID) | ORAL | Status: DC

## 2015-11-27 MED ORDER — INSULIN PEN NEEDLE 32G X 6 MM MISC: 1.0000 | Freq: Every day | Status: DC

## 2015-11-27 MED ORDER — TRAZODONE HCL 100 MG PO TABS: 200.0000 mg | ORAL_TABLET | Freq: Every evening | ORAL | Status: DC | PRN

## 2015-11-27 MED ORDER — METOPROLOL SUCCINATE ER 100 MG PO TB24: 200.0000 mg | ORAL_TABLET | Freq: Every day | ORAL | Status: DC

## 2015-11-27 MED ORDER — VALSARTAN 320 MG PO TABS: 320.0000 mg | ORAL_TABLET | Freq: Every day | ORAL | Status: DC

## 2015-11-27 NOTE — Patient Instructions (Signed)
Hypertension Hypertension, commonly called high blood pressure, is when the force of blood pumping through your arteries is too strong. Your arteries are the blood vessels that carry blood from your heart throughout your body. A blood pressure reading consists of a higher number over a lower number, such as 110/72. The higher number (systolic) is the pressure inside your arteries when your heart pumps. The lower number (diastolic) is the pressure inside your arteries when your heart relaxes. Ideally you want your blood pressure below 120/80. Hypertension forces your heart to work harder to pump blood. Your arteries may become narrow or stiff. Having untreated or uncontrolled hypertension can cause heart attack, stroke, kidney disease, and other problems. RISK FACTORS Some risk factors for high blood pressure are controllable. Others are not.  Risk factors you cannot control include:   Race. You may be at higher risk if you are African American.  Age. Risk increases with age.  Gender. Men are at higher risk than women before age 45 years. After age 65, women are at higher risk than men. Risk factors you can control include:  Not getting enough exercise or physical activity.  Being overweight.  Getting too much fat, sugar, calories, or salt in your diet.  Drinking too much alcohol. SIGNS AND SYMPTOMS Hypertension does not usually cause signs or symptoms. Extremely high blood pressure (hypertensive crisis) may cause headache, anxiety, shortness of breath, and nosebleed. DIAGNOSIS To check if you have hypertension, your health care provider will measure your blood pressure while you are seated, with your arm held at the level of your heart. It should be measured at least twice using the same arm. Certain conditions can cause a difference in blood pressure between your right and left arms. A blood pressure reading that is higher than normal on one occasion does not mean that you need treatment. If  it is not clear whether you have high blood pressure, you may be asked to return on a different day to have your blood pressure checked again. Or, you may be asked to monitor your blood pressure at home for 1 or more weeks. TREATMENT Treating high blood pressure includes making lifestyle changes and possibly taking medicine. Living a healthy lifestyle can help lower high blood pressure. You may need to change some of your habits. Lifestyle changes may include:  Following the DASH diet. This diet is high in fruits, vegetables, and whole grains. It is low in salt, red meat, and added sugars.  Keep your sodium intake below 2,300 mg per day.  Getting at least 30-45 minutes of aerobic exercise at least 4 times per week.  Losing weight if necessary.  Not smoking.  Limiting alcoholic beverages.  Learning ways to reduce stress. Your health care provider may prescribe medicine if lifestyle changes are not enough to get your blood pressure under control, and if one of the following is true:  You are 18-59 years of age and your systolic blood pressure is above 140.  You are 60 years of age or older, and your systolic blood pressure is above 150.  Your diastolic blood pressure is above 90.  You have diabetes, and your systolic blood pressure is over 140 or your diastolic blood pressure is over 90.  You have kidney disease and your blood pressure is above 140/90.  You have heart disease and your blood pressure is above 140/90. Your personal target blood pressure may vary depending on your medical conditions, your age, and other factors. HOME CARE INSTRUCTIONS    Have your blood pressure rechecked as directed by your health care provider.   Take medicines only as directed by your health care provider. Follow the directions carefully. Blood pressure medicines must be taken as prescribed. The medicine does not work as well when you skip doses. Skipping doses also puts you at risk for  problems.  Do not smoke.   Monitor your blood pressure at home as directed by your health care provider. SEEK MEDICAL CARE IF:   You think you are having a reaction to medicines taken.  You have recurrent headaches or feel dizzy.  You have swelling in your ankles.  You have trouble with your vision. SEEK IMMEDIATE MEDICAL CARE IF:  You develop a severe headache or confusion.  You have unusual weakness, numbness, or feel faint.  You have severe chest or abdominal pain.  You vomit repeatedly.  You have trouble breathing. MAKE SURE YOU:   Understand these instructions.  Will watch your condition.  Will get help right away if you are not doing well or get worse.   This information is not intended to replace advice given to you by your health care provider. Make sure you discuss any questions you have with your health care provider.   Document Released: 09/30/2005 Document Revised: 02/14/2015 Document Reviewed: 07/23/2013 Elsevier Interactive Patient Education 2016 [employer]  

## 2015-11-27 NOTE — Progress Notes (Signed)
Subjective:  Patient ID: [name], male    DOB: June 20, 1966  Age: 50 y.o. MRN: [id number]  CC: Hyperlipidemia; Diabetes; and Hypertension   HPI [name] presents for a follow-up on diabetes, gout , hypertension, and hyperlipidemia. When I last saw him his A1c was up to 12.6%. I had prescribed Terrance Mass, as well as Janumet. He did not know I had prescribed [place] and [place] and has not been using those. He nonetheless thinks that his blood sugars have been well controlled with his CBG running in the 120-150 range. He also tells me that for gout Uloric is too expensive and he wants to change to allopurinol. He also doesn't think that he needs colchicine anymore and is concerned it may have caused a few episodes of diarrhea. He has ongoing low back pain and requests a refill for hydrocodone and has general anxiety as well and requests a refill on xanax.  Outpatient Prescriptions Prior to Visit  Medication Sig Dispense Refill  . aspirin 81 MG tablet Take 1 tablet (81 mg total) by mouth daily. 30 tablet 11  . colchicine 0.6 MG tablet Take 1 tablet (0.6 mg total) by mouth 2 (two) times daily. 180 tablet 1  . escitalopram (LEXAPRO) 20 MG tablet     . sitaGLIPtin-metformin (JANUMET) 50-1000 MG per tablet Take 1 tablet by mouth 2 (two) times daily with a meal. 180 tablet 1  . ALPRAZolam (XANAX) 0.5 MG tablet Take 1 tablet (0.5 mg total) by mouth 2 (two) times daily as needed for anxiety. 60 tablet 2  . febuxostat (ULORIC) 40 MG tablet Take 2 tablets (80 mg total) by mouth daily. 90 tablet 3  . HYDROcodone-acetaminophen (NORCO) 10-325 MG tablet Take 1 tablet by mouth every 8 (eight) hours as needed. 90 tablet 0  . metoprolol succinate (TOPROL-XL) 100 MG 24 hr tablet Take 2 tablets (200 mg total) by mouth daily. Take with or immediately following a meal. 180 tablet 2  . tiZANidine (ZANAFLEX) 4 MG capsule Take 1 capsule (4 mg total) by mouth 3 (three) times daily. 270 capsule 2  .  traZODone (DESYREL) 100 MG tablet Take 2 tablets (200 mg total) by mouth at bedtime as needed for sleep. 180 tablet 1  . Icosapent Ethyl (VASCEPA) 1 G CAPS Take 2 capsules by mouth 2 (two) times daily. (Patient not taking: Reported on 11/27/2015) 120 capsule 11  . dapagliflozin propanediol (FARXIGA) 5 MG TABS tablet Take 5 mg by mouth daily. (Patient not taking: Reported on 11/27/2015) 30 tablet 11  . Insulin Glargine (TOUJEO SOLOSTAR) 300 UNIT/ML SOPN Inject 30 Units into the skin daily. (Patient not taking: Reported on 11/27/2015) 1.5 mL 11  . Insulin Pen Needle (NOVOFINE) 32G X 6 MM MISC 1 Act by Does not apply route daily. Use 1 QD (Patient not taking: Reported on 11/27/2015) 100 each 3   No facility-administered medications prior to visit.    ROS Review of Systems  Constitutional: Negative.  Negative for fever, chills, diaphoresis, appetite change and fatigue.  HENT: Negative.  Negative for trouble swallowing.   Eyes: Negative.   Respiratory: Negative.  Negative for cough, choking, chest tightness, shortness of breath and stridor.   Cardiovascular: Negative.  Negative for chest pain, palpitations and leg swelling.  Gastrointestinal: Negative.  Negative for nausea, abdominal pain, diarrhea, constipation and blood in stool.  Endocrine: Negative.  Negative for polydipsia, polyphagia and polyuria.  Genitourinary: Negative.  Negative for dysuria and frequency.  Musculoskeletal: Positive for back pain. Negative  for myalgias, joint swelling, arthralgias and neck pain.  Skin: Negative.   Allergic/Immunologic: Negative.   Neurological: Negative.  Negative for dizziness, tremors, syncope, facial asymmetry, light-headedness and numbness.  Hematological: Negative.  Negative for adenopathy. Does not bruise/bleed easily.  Psychiatric/Behavioral: Positive for sleep disturbance and dysphoric mood. Negative for suicidal ideas, confusion, self-injury and decreased concentration. The patient is  nervous/anxious.     Objective:  BP 130/98 mmHg  Pulse 104  Temp(Src) 98.3 F (36.8 C) (Oral)  Resp 16  Ht 5\' 11"  (1.803 m)  Wt 215 lb (97.523 kg)  BMI 30.00 kg/m2  SpO2 95%  BP Readings from Last 3 Encounters:  11/27/15 130/98  07/25/15 118/88  03/09/15 136/88    Wt Readings from Last 3 Encounters:  11/27/15 215 lb (97.523 kg)  07/25/15 201 lb (91.173 kg)  03/09/15 218 lb (98.884 kg)    Physical Exam  Constitutional: He is oriented to person, place, and time. He appears well-developed and well-nourished. No distress.  HENT:  Head: Normocephalic and atraumatic.  Mouth/Throat: Oropharynx is clear and moist. No oropharyngeal exudate.  Eyes: Conjunctivae are normal. Right eye exhibits no discharge. Left eye exhibits no discharge. No scleral icterus.  Neck: Normal range of motion. Neck supple. No JVD present. No tracheal deviation present. No thyromegaly present.  Cardiovascular: Normal rate, regular rhythm and intact distal pulses.  Exam reveals no gallop and no friction rub.   No murmur heard. Pulmonary/Chest: Effort normal and breath sounds normal. No stridor. No respiratory distress. He has no wheezes. He has no rales. He exhibits no tenderness.  Abdominal: Soft. Bowel sounds are normal. He exhibits no distension and no mass. There is no tenderness. There is no rebound and no guarding.  Musculoskeletal: Normal range of motion. He exhibits no edema or tenderness.  Lymphadenopathy:    He has no cervical adenopathy.  Neurological: He is oriented to person, place, and time.  Skin: Skin is warm and dry. No rash noted. He is not diaphoretic. No erythema. No pallor.  Psychiatric: He has a normal mood and affect. His behavior is normal. Judgment and thought content normal.  Vitals reviewed.   Lab Results  Component Value Date   WBC 8.9 11/27/2015   HGB 16.8 11/27/2015   HCT 49.4 11/27/2015   PLT 220.0 11/27/2015   GLUCOSE 138* 11/27/2015   CHOL 188 11/27/2015   TRIG  385.0* 11/27/2015   HDL 40.10 11/27/2015   LDLDIRECT 102.0 11/27/2015   ALT 32 12/01/2014   AST 23 12/01/2014   NA 140 11/27/2015   K 4.4 11/27/2015   CL 101 11/27/2015   CREATININE 1.00 11/27/2015   BUN 11 11/27/2015   CO2 30 11/27/2015   TSH 1.04 03/09/2015   INR 1.3 01/13/2009   HGBA1C 7.0* 11/27/2015   MICROALBUR 1.1 03/09/2015    Dg Lumbar Spine Complete  12/02/2014  CLINICAL DATA:  Low back pain with left-sided sciatica for 2 months EXAM: LUMBAR SPINE - COMPLETE 4+ VIEW COMPARISON:  None. FINDINGS: Frontal, lateral, spot lumbosacral lateral, bilateral oblique views were obtained. There are 6 non-rib-bearing lumbar type vertebral bodies. There is lower lumbar levoscoliosis. There is postoperative change at L6-S1. There is no fracture or spondylolisthesis. There is moderate disc space narrowing at L6-S1. There is slightly milder narrowing at L4-5 and L5-6. 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 IMPRESSION: Postoperative change and osteoarthritic change. Mild levoscoliosis. No fracture or spondylolisthesis. Electronically Signed   By: Bretta Bang III M.D.   On: 12/02/2014  08:32    Assessment & Plan:   Michael was seen today for hyperlipidemia, diabetes and hypertension.  Diagnoses and all orders for this visit:  Essential hypertension, benign-  His blood pressure is not well controlled, I have asked him to start an ARB, his labs do not show any secondary metabolic causes for hypertension. -     Basic metabolic panel; Future -     CBC with Differential/Platelet; Future -     metoprolol succinate (TOPROL-XL) 100 MG 24 hr tablet; Take 2 tablets (200 mg total) by mouth daily. Take with or immediately following a meal. -     valsartan (DIOVAN) 320 MG tablet; Take 1 tablet (320 mg total) by mouth daily.  Type 2 diabetes mellitus with complication, without long-term current use of insulin (HCC)-  His A1c is 7.0% indicating that his blood sugars are  well-controlled on Janumet, will continue. -     Hemoglobin A1c; Future -     Insulin Pen Needle (NOVOFINE) 32G X 6 MM MISC; 1 Act by Does not apply route daily. Use 1 QD -     POCT Glucose (CBG) -     valsartan (DIOVAN) 320 MG tablet; Take 1 tablet (320 mg total) by mouth daily. -     Ambulatory referral to Ophthalmology  Hyperlipidemia with target LDL less than 100-  He is achieved his LDL goal and at this time is not willing to take a statin. -     Lipid panel; Future  Hypertriglyceridemia, essential-  Will treat this with omega-3 fish oils. -     Lipid panel; Future  Idiopathic gout, unspecified chronicity, unspecified site  Idiopathic chronic gout of foot without tophus, unspecified laterality-  Will change Uloric to Zyloprim at his request. -     allopurinol (ZYLOPRIM) 100 MG tablet; Take 1 tablet (100 mg total) by mouth daily.  Depression with anxiety -     ALPRAZolam (XANAX) 0.5 MG tablet; Take 1 tablet (0.5 mg total) by mouth 2 (two) times daily as needed for anxiety. -     traZODone (DESYREL) 100 MG tablet; Take 2 tablets (200 mg total) by mouth at bedtime as needed for sleep.  Back pain without radiation -     tiZANidine (ZANAFLEX) 4 MG capsule; Take 1 capsule (4 mg total) by mouth 3 (three) times daily. -     HYDROcodone-acetaminophen (NORCO) 10-325 MG tablet; Take 1 tablet by mouth every 8 (eight) hours as needed.  Other orders -     Cancel: colchicine 0.6 MG tablet; Take 1 tablet (0.6 mg total) by mouth 2 (two) times daily. -     Cancel: febuxostat (ULORIC) 40 MG tablet; Take 2 tablets (80 mg total) by mouth daily. -     Cancel: dapagliflozin propanediol (FARXIGA) 5 MG TABS tablet; Take 5 mg by mouth daily. -     Cancel: Insulin Glargine (TOUJEO SOLOSTAR) 300 UNIT/ML SOPN; Inject 30 Units into the skin daily.   I have discontinued Mr. [name] febuxostat, dapagliflozin propanediol, and Insulin Glargine. I am also having him start on valsartan and allopurinol.  Additionally, I am having him maintain his escitalopram, sitaGLIPtin-metformin, aspirin, Icosapent Ethyl, colchicine, ALPRAZolam, metoprolol succinate, tiZANidine, traZODone, Insulin Pen Needle, and HYDROcodone-acetaminophen.  Meds ordered this encounter  Medications  . ALPRAZolam (XANAX) 0.5 MG tablet    Sig: Take 1 tablet (0.5 mg total) by mouth 2 (two) times daily as needed for anxiety.    Dispense:  60 tablet    Refill:  2  . metoprolol succinate (TOPROL-XL) 100 MG 24 hr tablet    Sig: Take 2 tablets (200 mg total) by mouth daily. Take with or immediately following a meal.    Dispense:  180 tablet    Refill:  2  . tiZANidine (ZANAFLEX) 4 MG capsule    Sig: Take 1 capsule (4 mg total) by mouth 3 (three) times daily.    Dispense:  270 capsule    Refill:  2  . traZODone (DESYREL) 100 MG tablet    Sig: Take 2 tablets (200 mg total) by mouth at bedtime as needed for sleep.    Dispense:  180 tablet    Refill:  1  . Insulin Pen Needle (NOVOFINE) 32G X 6 MM MISC    Sig: 1 Act by Does not apply route daily. Use 1 QD    Dispense:  100 each    Refill:  3  . valsartan (DIOVAN) 320 MG tablet    Sig: Take 1 tablet (320 mg total) by mouth daily.    Dispense:  90 tablet    Refill:  1  . allopurinol (ZYLOPRIM) 100 MG tablet    Sig: Take 1 tablet (100 mg total) by mouth daily.    Dispense:  30 tablet    Refill:  11  . HYDROcodone-acetaminophen (NORCO) 10-325 MG tablet    Sig: Take 1 tablet by mouth every 8 (eight) hours as needed.    Dispense:  90 tablet    Refill:  0    Fill on or after 11/27/15     Follow-up: Return in about 4 months (around 03/26/2016).  Sanda Linger, MD

## 2015-11-27 NOTE — Progress Notes (Signed)
Pre visit review using our clinic review tool, if applicable. No additional management support is needed unless otherwise documented below in the visit note. 

## 2015-11-28 ENCOUNTER — Telehealth: Payer: Self-pay | Admitting: Internal Medicine

## 2015-11-28 ENCOUNTER — Encounter: Payer: Self-pay | Admitting: Internal Medicine

## 2015-11-28 LAB — BASIC METABOLIC PANEL
BUN: 11 mg/dL (ref 6–23)
CALCIUM: 9.9 mg/dL (ref 8.4–10.5)
CHLORIDE: 101 meq/L (ref 96–112)
CO2: 30 meq/L (ref 19–32)
CREATININE: 1 mg/dL (ref 0.40–1.50)
GFR: 84.22 mL/min (ref >60.00)
Glucose, Bld: 138 mg/dL — ABNORMAL HIGH (ref 70–99)
Potassium: 4.4 mEq/L (ref 3.5–5.1)
SODIUM: 140 meq/L (ref 135–145)

## 2015-11-28 LAB — HEMOGLOBIN A1C: HEMOGLOBIN A1C: 7 % — AB (ref 4.6–6.5)

## 2015-11-28 LAB — LIPID PANEL
Cholesterol: 188 mg/dL (ref 0–200)
HDL: 40.1 mg/dL (ref >39.00)
NONHDL: 148.21
Total CHOL/HDL Ratio: 5
Triglycerides: 385 mg/dL — ABNORMAL HIGH (ref 0.0–149.0)
VLDL: 77 mg/dL — ABNORMAL HIGH (ref 0.0–40.0)

## 2015-11-28 LAB — LDL CHOLESTEROL, DIRECT: LDL DIRECT: 102 mg/dL

## 2015-11-28 NOTE — Telephone Encounter (Signed)
Pt called wondering what dr. Yetta Barre recommend or can do for him. Pt stated he has a headache when he ejaculate (not before or during but only when he is about to finish) and headache will last about 30 min to an hour, pt stated this is happening about a year now,pain come from the occipital lop (base of the neck), never had head injury. Please call pt  # [phone] or # [phone]-spouse

## 2015-11-29 NOTE — Telephone Encounter (Signed)
These types of headaches do not usually need to be treated.

## 2015-11-30 NOTE — Telephone Encounter (Signed)
CMA please help call pt

## 2015-12-01 NOTE — Telephone Encounter (Signed)
Offered pt an apt He declines at this time. Will call back if symptoms worse or fail to improve

## 2015-12-25 ENCOUNTER — Telehealth: Payer: Self-pay | Admitting: *Deleted

## 2015-12-25 DIAGNOSIS — M549 Dorsalgia, unspecified: Secondary | ICD-10-CM

## 2015-12-25 MED ORDER — HYDROCODONE-ACETAMINOPHEN 10-325 MG PO TABS: 1.0000 | ORAL_TABLET | Freq: Three times a day (TID) | ORAL | Status: DC | PRN

## 2015-12-25 NOTE — Telephone Encounter (Signed)
Received call pt is requesting refills on his pain med "Hydrocodone"...Raechel Chute/lmb

## 2015-12-25 NOTE — Telephone Encounter (Signed)
Tried calling can't leave msg due to vm not set-up...Raechel Chute/lmb

## 2015-12-25 NOTE — Telephone Encounter (Signed)
Tried calling pt again still no answer &can't leave msg due to vm not set-up. Place rx in cabinet for pick-up...Raechel Chute/lmb

## 2015-12-25 NOTE — Telephone Encounter (Signed)
done

## 2016-01-23 ENCOUNTER — Other Ambulatory Visit: Payer: Self-pay | Admitting: Internal Medicine

## 2016-01-23 ENCOUNTER — Telehealth: Payer: Self-pay | Admitting: Internal Medicine

## 2016-01-23 DIAGNOSIS — Z794 Long term (current) use of insulin: Principal | ICD-10-CM

## 2016-01-23 DIAGNOSIS — E118 Type 2 diabetes mellitus with unspecified complications: Secondary | ICD-10-CM

## 2016-01-23 DIAGNOSIS — M549 Dorsalgia, unspecified: Secondary | ICD-10-CM

## 2016-01-23 MED ORDER — HYDROCODONE-ACETAMINOPHEN 10-325 MG PO TABS: 1.0000 | ORAL_TABLET | Freq: Three times a day (TID) | ORAL | Status: DC | PRN

## 2016-01-23 MED ORDER — SITAGLIPTIN PHOS-METFORMIN HCL 50-1000 MG PO TABS: 1.0000 | ORAL_TABLET | Freq: Two times a day (BID) | ORAL | Status: DC

## 2016-01-23 NOTE — Telephone Encounter (Signed)
Notified pt rx ready for pick-up.../lmb 

## 2016-01-23 NOTE — Telephone Encounter (Signed)
done

## 2016-01-23 NOTE — Telephone Encounter (Signed)
Pt requesting refills for sitaGLIPtin-metformin (JANUMET) 50-1000 MG per tablet [[id number]][[id number]] and HYDROcodone-acetaminophen (NORCO) 10-325 MG tablet [[id number]][[id number]]

## 2016-01-24 ENCOUNTER — Telehealth: Payer: Self-pay | Admitting: Internal Medicine

## 2016-01-24 NOTE — Telephone Encounter (Signed)
Routing to you as fyi pt picked up script yesterday

## 2016-01-24 NOTE — Telephone Encounter (Signed)
Pt called in to make PCP aware that he has had more discomfort which has caused him to take more of his pain medication. Hydrocordone. Pt says that he went to pharmacy and was advised that Rx couldn't be filled due to it being to soon. He says that pharmacy suggested that he call his PCP to have filled sooner.   Please advise .   Thanks.

## 2016-01-24 NOTE — Telephone Encounter (Signed)
No, I do not approve of this

## 2016-01-25 NOTE — Telephone Encounter (Signed)
Voicemail box not set up. Will try again to inform of note below

## 2016-02-05 ENCOUNTER — Other Ambulatory Visit: Payer: Self-pay | Admitting: Internal Medicine

## 2016-02-06 NOTE — Telephone Encounter (Signed)
Faxed script back to [facility].../lmb 

## 2016-02-16 ENCOUNTER — Telehealth: Payer: Self-pay | Admitting: *Deleted

## 2016-02-16 ENCOUNTER — Other Ambulatory Visit: Payer: Self-pay | Admitting: Internal Medicine

## 2016-02-16 DIAGNOSIS — M549 Dorsalgia, unspecified: Secondary | ICD-10-CM

## 2016-02-16 MED ORDER — HYDROCODONE-ACETAMINOPHEN 10-325 MG PO TABS: 1.0000 | ORAL_TABLET | Freq: Three times a day (TID) | ORAL | Status: DC | PRN

## 2016-02-16 NOTE — Telephone Encounter (Signed)
Pt request refill for HYDROcodone-acetaminophen (NORCO) 10-325 MG. Please call him

## 2016-02-16 NOTE — Telephone Encounter (Signed)
Tried calling both numbers to contact pt. Script will be at front

## 2016-02-16 NOTE — Telephone Encounter (Signed)
Pt had left msg on triage requesting refill on his hydrocodone. See previous msg already been done...Raechel Chute/lmb

## 2016-02-16 NOTE — Telephone Encounter (Signed)
Please advise 

## 2016-03-06 ENCOUNTER — Encounter: Payer: Self-pay | Admitting: Internal Medicine

## 2016-03-20 ENCOUNTER — Encounter: Payer: Self-pay | Admitting: Internal Medicine

## 2016-03-20 ENCOUNTER — Ambulatory Visit (INDEPENDENT_AMBULATORY_CARE_PROVIDER_SITE_OTHER): Payer: Self-pay | Admitting: Internal Medicine

## 2016-03-20 ENCOUNTER — Other Ambulatory Visit (INDEPENDENT_AMBULATORY_CARE_PROVIDER_SITE_OTHER): Payer: Self-pay

## 2016-03-20 VITALS — BP 120/90 | HR 67 | Temp 98.4°F | Resp 16 | Ht 71.0 in | Wt 222.0 lb

## 2016-03-20 DIAGNOSIS — G4482 Headache associated with sexual activity: Secondary | ICD-10-CM

## 2016-03-20 DIAGNOSIS — M549 Dorsalgia, unspecified: Secondary | ICD-10-CM

## 2016-03-20 DIAGNOSIS — E781 Pure hyperglyceridemia: Secondary | ICD-10-CM

## 2016-03-20 DIAGNOSIS — L247 Irritant contact dermatitis due to plants, except food: Secondary | ICD-10-CM

## 2016-03-20 DIAGNOSIS — I10 Essential (primary) hypertension: Secondary | ICD-10-CM

## 2016-03-20 DIAGNOSIS — F418 Other specified anxiety disorders: Secondary | ICD-10-CM

## 2016-03-20 DIAGNOSIS — E118 Type 2 diabetes mellitus with unspecified complications: Secondary | ICD-10-CM

## 2016-03-20 LAB — BASIC METABOLIC PANEL WITH GFR
BUN: 8 mg/dL (ref 6–23)
CO2: 30 meq/L (ref 19–32)
Calcium: 9.7 mg/dL (ref 8.4–10.5)
Chloride: 97 meq/L (ref 96–112)
Creatinine, Ser: 0.95 mg/dL (ref 0.40–1.50)
GFR: 89.24 mL/min (ref >60.00)
Glucose, Bld: 213 mg/dL — ABNORMAL HIGH (ref 70–99)
Potassium: 4.5 meq/L (ref 3.5–5.1)
Sodium: 134 meq/L — ABNORMAL LOW (ref 135–145)

## 2016-03-20 LAB — HEMOGLOBIN A1C: HEMOGLOBIN A1C: 8 % — AB (ref 4.6–6.5)

## 2016-03-20 LAB — TRIGLYCERIDES: Triglycerides: 957 mg/dL — ABNORMAL HIGH (ref 0.0–149.0)

## 2016-03-20 MED ORDER — METHYLPREDNISOLONE ACETATE 80 MG/ML IJ SUSP
120.0000 mg | Freq: Once | INTRAMUSCULAR | Status: AC
  Administered 2016-03-20: 120 mg via INTRAMUSCULAR

## 2016-03-20 MED ORDER — FLUOCINONIDE-E 0.05 % EX CREA: 1.0000 "application " | TOPICAL_CREAM | Freq: Two times a day (BID) | CUTANEOUS | Status: DC

## 2016-03-20 MED ORDER — HYDROCODONE-ACETAMINOPHEN 10-325 MG PO TABS: 1.0000 | ORAL_TABLET | Freq: Three times a day (TID) | ORAL | Status: DC | PRN

## 2016-03-20 MED ORDER — TRAZODONE HCL 100 MG PO TABS: 200.0000 mg | ORAL_TABLET | Freq: Every evening | ORAL | Status: DC | PRN

## 2016-03-20 MED ORDER — SUMATRIPTAN-NAPROXEN SODIUM 85-500 MG PO TABS: 1.0000 | ORAL_TABLET | ORAL | Status: DC | PRN

## 2016-03-20 NOTE — Progress Notes (Signed)
Subjective:  Patient ID: [name], male    DOB: [birth year]  Age: 50 y.o. MRN: [id number]  CC: Diabetes; Rash; Headache; and Hyperlipidemia   HPI [name] presents for follow-up on multiple medical problems.  He complains of a 4 day history of rash on his arms and legs after being exposed to poison oak. The rash is itchy but there is no pain or blisters. He has treated it with calamine lotion and has not gotten much symptom relief. He also tells me that for the last year or 2 he has had an occasional postcoital headache. He describes right after sex having a stabbing sensation in the back of his head above the neck. The headache will last for several hours if he doesn't treat it but when he takes ibuprofen it gets better over the next few hours. He never has had nausea or vomiting with the headache and does not experience any numbness/weakness/tingling in his arms or legs. In between headaches she feels perfectly fine.  He is having a hard time controlling his blood sugar because he can't afford his diabetic medications. He is also not taking the fish oil for the high triglycerides. He doesn't know if this because they're too expensive for he just forgot to take him. He has not made any progress on his lifestyle modifications either.  Outpatient Prescriptions Prior to Visit  Medication Sig Dispense Refill  . allopurinol (ZYLOPRIM) 100 MG tablet Take 1 tablet (100 mg total) by mouth daily. 30 tablet 11  . ALPRAZolam (XANAX) 0.5 MG tablet TAKE ONE TABLET TWICE DAILY AS NEEDED FOR ANXIETY 60 tablet 3  . aspirin 81 MG tablet Take 1 tablet (81 mg total) by mouth daily. 30 tablet 11  . colchicine 0.6 MG tablet Take 1 tablet (0.6 mg total) by mouth 2 (two) times daily. 180 tablet 1  . escitalopram (LEXAPRO) 20 MG tablet     . Icosapent Ethyl (VASCEPA) 1 G CAPS Take 2 capsules by mouth 2 (two) times daily. (Patient not taking: Reported on 11/27/2015) 120 capsule 11  . Insulin Pen Needle  (NOVOFINE) 32G X 6 MM MISC 1 Act by Does not apply route daily. Use 1 QD 100 each 3  . metoprolol succinate (TOPROL-XL) 100 MG 24 hr tablet Take 2 tablets (200 mg total) by mouth daily. Take with or immediately following a meal. 180 tablet 2  . sitaGLIPtin-metformin (JANUMET) 50-1000 MG tablet Take 1 tablet by mouth 2 (two) times daily with a meal. 180 tablet 1  . tiZANidine (ZANAFLEX) 4 MG capsule Take 1 capsule (4 mg total) by mouth 3 (three) times daily. 270 capsule 2  . valsartan (DIOVAN) 320 MG tablet Take 1 tablet (320 mg total) by mouth daily. 90 tablet 1  . HYDROcodone-acetaminophen (NORCO) 10-325 MG tablet Take 1 tablet by mouth every 8 (eight) hours as needed. 90 tablet 0  . traZODone (DESYREL) 100 MG tablet Take 2 tablets (200 mg total) by mouth at bedtime as needed for sleep. 180 tablet 1   No facility-administered medications prior to visit.    ROS Review of Systems  Constitutional: Negative.  Negative for chills, fatigue and unexpected weight change.  HENT: Negative.   Eyes: Negative.  Negative for visual disturbance.  Respiratory: Negative.  Negative for cough, choking, chest tightness, shortness of breath and stridor.   Cardiovascular: Negative.  Negative for chest pain, palpitations and leg swelling.  Gastrointestinal: Negative.  Negative for nausea, vomiting, abdominal pain, diarrhea and constipation.  Endocrine: Negative  for polydipsia, polyphagia and polyuria.  Genitourinary: Negative.  Negative for dysuria, urgency, frequency and flank pain.  Musculoskeletal: Positive for back pain. Negative for myalgias, arthralgias and neck pain.  Skin: Positive for rash. Negative for color change, pallor and wound.  Neurological: Negative.  Negative for dizziness, tremors, syncope, light-headedness, numbness and headaches.  Hematological: Negative.  Negative for adenopathy. Does not bruise/bleed easily.  Psychiatric/Behavioral: Positive for sleep disturbance. Negative for suicidal  ideas, hallucinations, behavioral problems, confusion, self-injury, dysphoric mood, decreased concentration and agitation. The patient is nervous/anxious.     Objective:  BP 120/90 mmHg  Pulse 67  Temp(Src) 98.4 F (36.9 C) (Oral)  Resp 16  Ht 5\' 11"  (1.803 m)  Wt 222 lb (100.699 kg)  BMI 30.98 kg/m2  SpO2 97%  BP Readings from Last 3 Encounters:  03/20/16 120/90  11/27/15 130/98  07/25/15 118/88    Wt Readings from Last 3 Encounters:  03/20/16 222 lb (100.699 kg)  11/27/15 215 lb (97.523 kg)  07/25/15 201 lb (91.173 kg)    Physical Exam  Constitutional: He is oriented to person, place, and time. He appears well-developed and well-nourished. No distress.  HENT:  Mouth/Throat: Oropharynx is clear and moist. No oropharyngeal exudate.  Eyes: Conjunctivae are normal. Right eye exhibits no discharge. Left eye exhibits no discharge. No scleral icterus.  Neck: Normal range of motion. Neck supple. No JVD present. No tracheal deviation present. No thyromegaly present.  Cardiovascular: Normal rate, regular rhythm, normal heart sounds and intact distal pulses.  Exam reveals no gallop and no friction rub.   No murmur heard. Pulmonary/Chest: Effort normal and breath sounds normal. No stridor. No respiratory distress. He has no wheezes. He has no rales. He exhibits no tenderness.  Abdominal: Soft. Bowel sounds are normal. He exhibits no distension and no mass. There is no tenderness. There is no rebound and no guarding.  Musculoskeletal: Normal range of motion. He exhibits no edema or tenderness.  Lymphadenopathy:    He has no cervical adenopathy.  Neurological: He is alert and oriented to person, place, and time. He has normal reflexes. He displays normal reflexes. No cranial nerve deficit. He exhibits normal muscle tone. Coordination normal.  Skin: Skin is warm and dry. Rash noted. No bruising and no ecchymosis noted. Rash is papular. Rash is not nodular and not urticarial. He is not  diaphoretic. No erythema. No pallor.  On both upper and lower extremities there are scattered groups and linear streaks of erythematous papules with no scale or vesicles.  Psychiatric: He has a normal mood and affect. His behavior is normal. Judgment and thought content normal.  Vitals reviewed.   Lab Results  Component Value Date   WBC 8.9 11/27/2015   HGB 16.8 11/27/2015   HCT 49.4 11/27/2015   PLT 220.0 11/27/2015   GLUCOSE 213* 03/20/2016   CHOL 188 11/27/2015   TRIG 957.0* 03/20/2016   HDL 40.10 11/27/2015   LDLDIRECT 102.0 11/27/2015   ALT 32 12/01/2014   AST 23 12/01/2014   NA 134* 03/20/2016   K 4.5 03/20/2016   CL 97 03/20/2016   CREATININE 0.95 03/20/2016   BUN 8 03/20/2016   CO2 30 03/20/2016   TSH 1.04 03/09/2015   INR 1.3 01/13/2009   HGBA1C 8.0* 03/20/2016   MICROALBUR 1.1 03/09/2015    Dg Lumbar Spine Complete  12/02/2014  CLINICAL DATA:  Low back pain with left-sided sciatica for 2 months EXAM: LUMBAR SPINE - COMPLETE 4+ VIEW COMPARISON:  None. FINDINGS: Frontal, lateral,  spot lumbosacral lateral, bilateral oblique views were obtained. There are 6 non-rib-bearing lumbar type vertebral bodies. There is lower lumbar levoscoliosis. There is postoperative change at L6-S1. There is no fracture or spondylolisthesis. There is moderate disc space narrowing at L6-S1. There is slightly milder narrowing at L4-5 and L5-6. 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 IMPRESSION: Postoperative change and osteoarthritic change. Mild levoscoliosis. No fracture or spondylolisthesis. Electronically Signed   By: Bretta Bang III M.D.   On: 12/02/2014 08:32    Assessment & Plan:   Armend was seen today for diabetes, rash, headache and hyperlipidemia.  Diagnoses and all orders for this visit:  Essential hypertension, benign- his blood pressures adequately well-controlled, electrolytes and renal function are stable. -     Basic metabolic panel; Future -      Urinalysis, Routine w reflex microscopic (not at [facility]); Future  Type 2 diabetes mellitus with complication, without long-term current use of insulin (HCC)- his blood sugars are not well-controlled, he agrees to obtain decent insurance to help cover his expenses. -     Hemoglobin A1c; Future -     Basic metabolic panel; Future -     Microalbumin / creatinine urine ratio; Future  Hypertriglyceridemia- his triglycerides are nearly 1000, he agrees to be more compliant with the Vascepa fish oil therapy. -     Triglycerides; Future  Back pain without radiation -     HYDROcodone-acetaminophen (NORCO) 10-325 MG tablet; Take 1 tablet by mouth every 8 (eight) hours as needed. -     Drugs of abuse screen w/o alc, rtn urine-sln; Future  Depression with anxiety -     traZODone (DESYREL) 100 MG tablet; Take 2 tablets (200 mg total) by mouth at bedtime as needed for sleep.  Contact dermatitis and eczema due to plant- he has widespread and rather serious involvement so I gave him an injection of Deop-Medrol and will treat with topical steroids as well. -     fluocinonide-emollient (LIDEX-E) 0.05 % cream; Apply 1 application topically 2 (two) times daily. -     methylPREDNISolone acetate (DEPO-MEDROL) injection 120 mg; Inject 1.5 mLs (120 mg total) into the muscle once.  Coital headache- I've asked him to use a combination of naproxen and sumatriptan as needed for coital headaches. -     SUMAtriptan-naproxen (TREXIMET) 85-500 MG tablet; Take 1 tablet by mouth every 2 (two) hours as needed for migraine.   I am having Mr. Roig start on fluocinonide-emollient and SUMAtriptan-naproxen. I am also having him maintain his escitalopram, aspirin, Icosapent Ethyl, colchicine, metoprolol succinate, tiZANidine, Insulin Pen Needle, valsartan, allopurinol, sitaGLIPtin-metformin, ALPRAZolam, HYDROcodone-acetaminophen, and traZODone. We administered methylPREDNISolone acetate.  Meds ordered this encounter  Medications    . HYDROcodone-acetaminophen (NORCO) 10-325 MG tablet    Sig: Take 1 tablet by mouth every 8 (eight) hours as needed.    Dispense:  90 tablet    Refill:  0    Fill on or after 03/18/16  . traZODone (DESYREL) 100 MG tablet    Sig: Take 2 tablets (200 mg total) by mouth at bedtime as needed for sleep.    Dispense:  180 tablet    Refill:  1  . fluocinonide-emollient (LIDEX-E) 0.05 % cream    Sig: Apply 1 application topically 2 (two) times daily.    Dispense:  30 g    Refill:  0  . SUMAtriptan-naproxen (TREXIMET) 85-500 MG tablet    Sig: Take 1 tablet by mouth every 2 (two) hours as needed  for migraine.    Dispense:  6 tablet    Refill:  5  . methylPREDNISolone acetate (DEPO-MEDROL) injection 120 mg    Sig:      Follow-up: Return in about 4 months (around 07/20/2016).  Sanda Lingerhomas Jones, MD

## 2016-03-20 NOTE — Patient Instructions (Signed)

## 2016-03-20 NOTE — Progress Notes (Signed)
Pre visit review using our clinic review tool, if applicable. No additional management support is needed unless otherwise documented below in the visit note. 

## 2016-04-04 ENCOUNTER — Encounter: Payer: Self-pay | Admitting: Internal Medicine

## 2016-04-18 ENCOUNTER — Telehealth: Payer: Self-pay | Admitting: Emergency Medicine

## 2016-04-18 ENCOUNTER — Emergency Department (HOSPITAL_COMMUNITY): Admission: EM | Admit: 2016-04-18 | Discharge: 2016-04-18 | Discharge status: Absent without leave | Payer: Self-pay

## 2016-04-18 ENCOUNTER — Other Ambulatory Visit: Payer: Self-pay | Admitting: Internal Medicine

## 2016-04-18 DIAGNOSIS — M549 Dorsalgia, unspecified: Secondary | ICD-10-CM

## 2016-04-18 MED ORDER — HYDROCODONE-ACETAMINOPHEN 10-325 MG PO TABS: 1.0000 | ORAL_TABLET | Freq: Three times a day (TID) | ORAL | Status: DC | PRN

## 2016-04-18 NOTE — Telephone Encounter (Signed)
done

## 2016-04-18 NOTE — Telephone Encounter (Signed)
Patient called and stated he needed a refill on HYDROcodone-acetaminophen (NORCO) 10-325 MG tablet and samples of blood sugar medicine. Will be by tomorrow to pick up. Thanks

## 2016-04-18 NOTE — Telephone Encounter (Signed)
Place rx/samples in cabinet for pick-up...Raechel Chute/lmb

## 2016-05-20 ENCOUNTER — Other Ambulatory Visit: Payer: Self-pay | Admitting: Internal Medicine

## 2016-05-20 ENCOUNTER — Telehealth: Payer: Self-pay

## 2016-05-20 DIAGNOSIS — M549 Dorsalgia, unspecified: Secondary | ICD-10-CM

## 2016-05-20 MED ORDER — HYDROCODONE-ACETAMINOPHEN 10-325 MG PO TABS: 1.0000 | ORAL_TABLET | Freq: Three times a day (TID) | ORAL | 0 refills | Status: DC | PRN

## 2016-05-20 NOTE — Telephone Encounter (Signed)
RX written 

## 2016-05-20 NOTE — Telephone Encounter (Signed)
Pt informed, Rx in cabinet for pt pick up  

## 2016-05-20 NOTE — Telephone Encounter (Signed)
Pt called requesting refill of Hydrocodone Rx for Aug, thanks!

## 2016-06-14 ENCOUNTER — Telehealth: Payer: Self-pay | Admitting: *Deleted

## 2016-06-14 NOTE — Telephone Encounter (Signed)
Left msg on triage requesting refill on his Hydrocodone & wanting samples of Janumet if MD have samples. Pt not due for refill until 9/7. 24-48 hours on refills will forward to MD desk for approval once he return...Raechel Chute/LMB

## 2016-06-17 ENCOUNTER — Other Ambulatory Visit: Payer: Self-pay | Admitting: Internal Medicine

## 2016-06-17 DIAGNOSIS — M549 Dorsalgia, unspecified: Secondary | ICD-10-CM

## 2016-06-17 MED ORDER — HYDROCODONE-ACETAMINOPHEN 10-325 MG PO TABS: 1.0000 | ORAL_TABLET | Freq: Three times a day (TID) | ORAL | 0 refills | Status: DC | PRN

## 2016-06-17 NOTE — Telephone Encounter (Signed)
RX written Give samples if I have them

## 2016-06-18 ENCOUNTER — Telehealth: Payer: Self-pay | Admitting: Internal Medicine

## 2016-06-18 NOTE — Telephone Encounter (Signed)
Pt informed, Rx in cabinet for pt pick up, no samples available at this time

## 2016-06-18 NOTE — Telephone Encounter (Signed)
9.5.17 Pt's wife came in requesting samples of Janumet for pt, please adivse MS

## 2016-06-18 NOTE — Telephone Encounter (Signed)
Please advise 

## 2016-07-18 ENCOUNTER — Encounter: Payer: Self-pay | Admitting: Internal Medicine

## 2016-07-18 ENCOUNTER — Ambulatory Visit (INDEPENDENT_AMBULATORY_CARE_PROVIDER_SITE_OTHER): Payer: Self-pay | Admitting: Internal Medicine

## 2016-07-18 ENCOUNTER — Other Ambulatory Visit (INDEPENDENT_AMBULATORY_CARE_PROVIDER_SITE_OTHER): Payer: Self-pay

## 2016-07-18 VITALS — BP 160/116 | HR 69 | Temp 98.4°F | Resp 16 | Ht 71.0 in | Wt 213.5 lb

## 2016-07-18 DIAGNOSIS — E118 Type 2 diabetes mellitus with unspecified complications: Secondary | ICD-10-CM

## 2016-07-18 DIAGNOSIS — I10 Essential (primary) hypertension: Secondary | ICD-10-CM

## 2016-07-18 DIAGNOSIS — E781 Pure hyperglyceridemia: Secondary | ICD-10-CM

## 2016-07-18 DIAGNOSIS — M549 Dorsalgia, unspecified: Secondary | ICD-10-CM

## 2016-07-18 LAB — TRIGLYCERIDES: TRIGLYCERIDES: 361 mg/dL — AB (ref 0.0–149.0)

## 2016-07-18 LAB — HEMOGLOBIN A1C: Hgb A1c MFr Bld: 6.8 % — ABNORMAL HIGH (ref 4.6–6.5)

## 2016-07-18 LAB — MICROALBUMIN / CREATININE URINE RATIO
CREATININE, U: 238.1 mg/dL
MICROALB UR: 2.6 mg/dL — AB (ref 0.0–1.9)
MICROALB/CREAT RATIO: 1.1 mg/g (ref 0.0–30.0)

## 2016-07-18 LAB — BASIC METABOLIC PANEL
BUN: 9 mg/dL (ref 6–23)
CHLORIDE: 105 meq/L (ref 96–112)
CO2: 29 mEq/L (ref 19–32)
Calcium: 8.9 mg/dL (ref 8.4–10.5)
Creatinine, Ser: 0.86 mg/dL (ref 0.40–1.50)
GFR: 99.97 mL/min (ref >60.00)
Glucose, Bld: 110 mg/dL — ABNORMAL HIGH (ref 70–99)
POTASSIUM: 3.5 meq/L (ref 3.5–5.1)
SODIUM: 142 meq/L (ref 135–145)

## 2016-07-18 MED ORDER — SITAGLIP PHOS-METFORMIN HCL ER 100-1000 MG PO TB24: 1.0000 | ORAL_TABLET | Freq: Every day | ORAL | 1 refills | Status: DC

## 2016-07-18 MED ORDER — HYDROCODONE-ACETAMINOPHEN 10-325 MG PO TABS: 1.0000 | ORAL_TABLET | Freq: Three times a day (TID) | ORAL | 0 refills | Status: DC | PRN

## 2016-07-18 MED ORDER — AZILSARTAN-CHLORTHALIDONE 40-12.5 MG PO TABS: 1.0000 | ORAL_TABLET | Freq: Every day | ORAL | 1 refills | Status: DC

## 2016-07-18 NOTE — Progress Notes (Signed)
Subjective:  Patient ID: [name], male    DOB: [birth year]  Age: 50 y.o. MRN: [id number]  CC: Hypertension; Hyperlipidemia; and Diabetes   HPI [name] presents for follow-up on the above medical concerns. He tells me his blood pressure has not been well controlled recently and he has had a few episodes of headache and blurred vision. He doesn't think he's been taking the valsartan, he is not sure why. He has been compliant with metoprolol though. He denies nausea, vomiting, paresthesias. He denies chest pain or shortness of breath. He has been taking some decongestants in the form of Advil Cold and Sinus for nasal congestion. He has not taken Xanax for several weeks because it was too expensive. He has stopped taking Lexapro. He does not take allopurinol or colchicine. He tried Treximet for headaches but said it caused side effects. He doesn't know if his blood sugars are well-controlled but he denies polyuria, polydipsia, polyphagia.  Outpatient Medications Prior to Visit  Medication Sig Dispense Refill  . allopurinol (ZYLOPRIM) 100 MG tablet Take 1 tablet (100 mg total) by mouth daily. 30 tablet 11  . aspirin 81 MG tablet Take 1 tablet (81 mg total) by mouth daily. 30 tablet 11  . colchicine 0.6 MG tablet Take 1 tablet (0.6 mg total) by mouth 2 (two) times daily. 180 tablet 1  . Icosapent Ethyl (VASCEPA) 1 G CAPS Take 2 capsules by mouth 2 (two) times daily. 120 capsule 11  . Insulin Pen Needle (NOVOFINE) 32G X 6 MM MISC 1 Act by Does not apply route daily. Use 1 QD 100 each 3  . metoprolol succinate (TOPROL-XL) 100 MG 24 hr tablet Take 2 tablets (200 mg total) by mouth daily. Take with or immediately following a meal. 180 tablet 2  . traZODone (DESYREL) 100 MG tablet Take 2 tablets (200 mg total) by mouth at bedtime as needed for sleep. 180 tablet 1  . ALPRAZolam (XANAX) 0.5 MG tablet TAKE ONE TABLET TWICE DAILY AS NEEDED FOR ANXIETY 60 tablet 3  . escitalopram (LEXAPRO) 20 MG  tablet     . fluocinonide-emollient (LIDEX-E) 0.05 % cream Apply 1 application topically 2 (two) times daily. 30 g 0  . HYDROcodone-acetaminophen (NORCO) 10-325 MG tablet Take 1 tablet by mouth every 8 (eight) hours as needed. 90 tablet 0  . sitaGLIPtin-metformin (JANUMET) 50-1000 MG tablet Take 1 tablet by mouth 2 (two) times daily with a meal. 180 tablet 1  . SUMAtriptan-naproxen (TREXIMET) 85-500 MG tablet Take 1 tablet by mouth every 2 (two) hours as needed for migraine. 6 tablet 5  . tiZANidine (ZANAFLEX) 4 MG capsule Take 1 capsule (4 mg total) by mouth 3 (three) times daily. 270 capsule 2  . valsartan (DIOVAN) 320 MG tablet Take 1 tablet (320 mg total) by mouth daily. 90 tablet 1   No facility-administered medications prior to visit.     ROS Review of Systems  Constitutional: Negative.  Negative for activity change, appetite change, diaphoresis, fatigue and unexpected weight change.  HENT: Negative for sinus pressure and trouble swallowing.   Eyes: Positive for visual disturbance. Negative for photophobia.  Respiratory: Negative.  Negative for cough, choking, chest tightness, shortness of breath and stridor.   Cardiovascular: Negative.  Negative for chest pain and leg swelling.  Gastrointestinal: Negative.  Negative for abdominal pain, blood in stool, constipation, diarrhea and vomiting.  Endocrine: Negative.   Genitourinary: Negative.  Negative for difficulty urinating, dysuria, frequency, penile pain, penile swelling and urgency.  Musculoskeletal:  Positive for back pain. Negative for arthralgias, joint swelling, myalgias and neck pain.  Skin: Negative.  Negative for color change and rash.  Allergic/Immunologic: Negative.   Neurological: Positive for headaches. Negative for dizziness, tremors, seizures, syncope, facial asymmetry, speech difficulty, weakness, light-headedness and numbness.  Hematological: Negative.  Negative for adenopathy. Does not bruise/bleed easily.    Psychiatric/Behavioral: Negative.  Negative for agitation, decreased concentration, dysphoric mood, self-injury, sleep disturbance and suicidal ideas. The patient is not nervous/anxious and is not hyperactive.     Objective:  BP (!) 160/116 (BP Location: Left Arm, Patient Position: Sitting, Cuff Size: Normal)   Pulse 69   Temp 98.4 F (36.9 C) (Oral)   Resp 16   Ht 5\' 11"  (1.803 m)   Wt 213 lb 8 oz (96.8 kg)   SpO2 96%   BMI 29.78 kg/m   BP Readings from Last 3 Encounters:  07/18/16 (!) 160/116  03/20/16 120/90  11/27/15 (!) 130/98    Wt Readings from Last 3 Encounters:  07/18/16 213 lb 8 oz (96.8 kg)  03/20/16 222 lb (100.7 kg)  11/27/15 215 lb (97.5 kg)    Physical Exam  Constitutional: He is oriented to person, place, and time. No distress.  HENT:  Mouth/Throat: Oropharynx is clear and moist. No oropharyngeal exudate.  Eyes: Conjunctivae are normal. Right eye exhibits no discharge. Left eye exhibits no discharge. No scleral icterus.  Neck: Normal range of motion. Neck supple. No JVD present. No tracheal deviation present. No thyromegaly present.  Cardiovascular: Normal rate, regular rhythm, normal heart sounds and intact distal pulses.  Exam reveals no gallop and no friction rub.   No murmur heard. Pulmonary/Chest: Effort normal and breath sounds normal. No stridor. No respiratory distress. He has no wheezes. He has no rales. He exhibits no tenderness.  Abdominal: Soft. Bowel sounds are normal. He exhibits no distension and no mass. There is no tenderness. There is no rebound and no guarding.  Musculoskeletal: Normal range of motion. He exhibits no edema, tenderness or deformity.  Lymphadenopathy:    He has no cervical adenopathy.  Neurological: He is oriented to person, place, and time.  Skin: Skin is warm and dry. No rash noted. He is not diaphoretic. No erythema. No pallor.  Psychiatric: He has a normal mood and affect. His behavior is normal. Judgment and thought  content normal.  Vitals reviewed.   Lab Results  Component Value Date   WBC 8.9 11/27/2015   HGB 16.8 11/27/2015   HCT 49.4 11/27/2015   PLT 220.0 11/27/2015   GLUCOSE 110 (H) 07/18/2016   CHOL 188 11/27/2015   TRIG 361.0 (H) 07/18/2016   HDL 40.10 11/27/2015   LDLDIRECT 102.0 11/27/2015   ALT 32 12/01/2014   AST 23 12/01/2014   NA 142 07/18/2016   K 3.5 07/18/2016   CL 105 07/18/2016   CREATININE 0.86 07/18/2016   BUN 9 07/18/2016   CO2 29 07/18/2016   TSH 1.04 03/09/2015   INR 1.3 01/13/2009   HGBA1C 6.8 (H) 07/18/2016   MICROALBUR 2.6 (H) 07/18/2016    Dg Lumbar Spine Complete  Result Date: 12/02/2014 CLINICAL DATA:  Low back pain with left-sided sciatica for 2 months EXAM: LUMBAR SPINE - COMPLETE 4+ VIEW COMPARISON:  None. FINDINGS: Frontal, lateral, spot lumbosacral lateral, bilateral oblique views were obtained. There are 6 non-rib-bearing lumbar type vertebral bodies. There is lower lumbar levoscoliosis. There is postoperative change at L6-S1. There is no fracture or spondylolisthesis. There is moderate disc space narrowing at  L6-S1. There is slightly milder narrowing at L4-5 and L5-6. 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 IMPRESSION: Postoperative change and osteoarthritic change. Mild levoscoliosis. No fracture or spondylolisthesis. Electronically Signed   By: Bretta BangWilliam  Woodruff III M.D.   On: 12/02/2014 08:32    Assessment & Plan:   Michael GreathouseRandall was seen today for hypertension, hyperlipidemia and diabetes.  Diagnoses and all orders for this visit:  Essential hypertension, benign- his blood pressure is poorly controlled and he is symptomatic, this is due to noncompliance and the addition of some decongestants. He agrees to stop taking decongestants. He will continue the beta blocker and I gave him samples of an ARB/thiazide combination to try to get better blood pressure control. -     Basic metabolic panel; Future -     Azilsartan-Chlorthalidone  (EDARBYCLOR) 40-12.5 MG TABS; Take 1 tablet by mouth daily.  Type 2 diabetes mellitus with complication, without long-term current use of insulin (HCC)- his A1c is 6.8%, blood sugars are adequately well controlled, will continue the combination of metformin and a DPP 4 inhibitor. -     Basic metabolic panel; Future -     Hemoglobin A1c; Future -     Microalbumin / creatinine urine ratio; Future -     SitaGLIPtin-MetFORMIN HCl (JANUMET XR) [phone] MG TB24; Take 1 tablet by mouth daily. -     Azilsartan-Chlorthalidone (EDARBYCLOR) 40-12.5 MG TABS; Take 1 tablet by mouth daily.  Hypertriglyceridemia- improvement noted, will continue the fish oil supplement and he will continue to work on his dietary modifications -     Triglycerides; Future  Back pain without radiation -     HYDROcodone-acetaminophen (NORCO) 10-325 MG tablet; Take 1 tablet by mouth every 8 (eight) hours as needed.   I have discontinued [name]'s escitalopram, tiZANidine, valsartan, sitaGLIPtin-metformin, ALPRAZolam, fluocinonide-emollient, and SUMAtriptan-naproxen. I am also having him start on SitaGLIPtin-MetFORMIN HCl and Azilsartan-Chlorthalidone. Additionally, I am having him maintain his aspirin, Icosapent Ethyl, colchicine, metoprolol succinate, Insulin Pen Needle, allopurinol, traZODone, and HYDROcodone-acetaminophen.  Meds ordered this encounter  Medications  . SitaGLIPtin-MetFORMIN HCl (JANUMET XR) [phone] MG TB24    Sig: Take 1 tablet by mouth daily.    Dispense:  90 tablet    Refill:  1  . Azilsartan-Chlorthalidone (EDARBYCLOR) 40-12.5 MG TABS    Sig: Take 1 tablet by mouth daily.    Dispense:  90 tablet    Refill:  1  . HYDROcodone-acetaminophen (NORCO) 10-325 MG tablet    Sig: Take 1 tablet by mouth every 8 (eight) hours as needed.    Dispense:  90 tablet    Refill:  0    Fill on or after 07/18/16     Follow-up: Return in about 3 weeks (around 08/08/2016).  Sanda Lingerhomas Asucena Galer, MD

## 2016-07-18 NOTE — Progress Notes (Signed)
Pre visit review using our clinic review tool, if applicable. No additional management support is needed unless otherwise documented below in the visit note. 

## 2016-07-18 NOTE — Patient Instructions (Signed)
Hypertension Hypertension, commonly called high blood pressure, is when the force of blood pumping through your arteries is too strong. Your arteries are the blood vessels that carry blood from your heart throughout your body. A blood pressure reading consists of a higher number over a lower number, such as 110/72. The higher number (systolic) is the pressure inside your arteries when your heart pumps. The lower number (diastolic) is the pressure inside your arteries when your heart relaxes. Ideally you want your blood pressure below 120/80. Hypertension forces your heart to work harder to pump blood. Your arteries may become narrow or stiff. Having untreated or uncontrolled hypertension can cause heart attack, stroke, kidney disease, and other problems. RISK FACTORS Some risk factors for high blood pressure are controllable. Others are not.  Risk factors you cannot control include:   Race. You may be at higher risk if you are African American.  Age. Risk increases with age.  Gender. Men are at higher risk than women before age 45 years. After age 65, women are at higher risk than men. Risk factors you can control include:  Not getting enough exercise or physical activity.  Being overweight.  Getting too much fat, sugar, calories, or salt in your diet.  Drinking too much alcohol. SIGNS AND SYMPTOMS Hypertension does not usually cause signs or symptoms. Extremely high blood pressure (hypertensive crisis) may cause headache, anxiety, shortness of breath, and nosebleed. DIAGNOSIS To check if you have hypertension, your health care provider will measure your blood pressure while you are seated, with your arm held at the level of your heart. It should be measured at least twice using the same arm. Certain conditions can cause a difference in blood pressure between your right and left arms. A blood pressure reading that is higher than normal on one occasion does not mean that you need treatment. If  it is not clear whether you have high blood pressure, you may be asked to return on a different day to have your blood pressure checked again. Or, you may be asked to monitor your blood pressure at home for 1 or more weeks. TREATMENT Treating high blood pressure includes making lifestyle changes and possibly taking medicine. Living a healthy lifestyle can help lower high blood pressure. You may need to change some of your habits. Lifestyle changes may include:  Following the DASH diet. This diet is high in fruits, vegetables, and whole grains. It is low in salt, red meat, and added sugars.  Keep your sodium intake below 2,300 mg per day.  Getting at least 30-45 minutes of aerobic exercise at least 4 times per week.  Losing weight if necessary.  Not smoking.  Limiting alcoholic beverages.  Learning ways to reduce stress. Your health care provider may prescribe medicine if lifestyle changes are not enough to get your blood pressure under control, and if one of the following is true:  You are 18-59 years of age and your systolic blood pressure is above 140.  You are 60 years of age or older, and your systolic blood pressure is above 150.  Your diastolic blood pressure is above 90.  You have diabetes, and your systolic blood pressure is over 140 or your diastolic blood pressure is over 90.  You have kidney disease and your blood pressure is above 140/90.  You have heart disease and your blood pressure is above 140/90. Your personal target blood pressure may vary depending on your medical conditions, your age, and other factors. HOME CARE INSTRUCTIONS    Have your blood pressure rechecked as directed by your health care provider.   Take medicines only as directed by your health care provider. Follow the directions carefully. Blood pressure medicines must be taken as prescribed. The medicine does not work as well when you skip doses. Skipping doses also puts you at risk for  problems.  Do not smoke.   Monitor your blood pressure at home as directed by your health care provider. SEEK MEDICAL CARE IF:   You think you are having a reaction to medicines taken.  You have recurrent headaches or feel dizzy.  You have swelling in your ankles.  You have trouble with your vision. SEEK IMMEDIATE MEDICAL CARE IF:  You develop a severe headache or confusion.  You have unusual weakness, numbness, or feel faint.  You have severe chest or abdominal pain.  You vomit repeatedly.  You have trouble breathing. MAKE SURE YOU:   Understand these instructions.  Will watch your condition.  Will get help right away if you are not doing well or get worse.   This information is not intended to replace advice given to you by your health care provider. Make sure you discuss any questions you have with your health care provider.   Document Released: 09/30/2005 Document Revised: 02/14/2015 Document Reviewed: 07/23/2013 Elsevier Interactive Patient Education 2016 [employer]  

## 2016-08-01 ENCOUNTER — Telehealth: Payer: Self-pay | Admitting: Internal Medicine

## 2016-08-01 NOTE — Telephone Encounter (Signed)
Letter printed and signed.  

## 2016-08-01 NOTE — Telephone Encounter (Signed)
Pt called in and would like Dr Yetta Barrejones to provide him a note for him to go back to work tomorrow.  He has been out and said that the ghout is getting better and would like to go back to work tomorrow   [employer] -[phone]

## 2016-08-01 NOTE — Telephone Encounter (Signed)
Pt called about this again. He wanted to make sure it was Saturday 10/14 to Today Thursday 10/19.

## 2016-08-05 ENCOUNTER — Telehealth: Payer: Self-pay | Admitting: Internal Medicine

## 2016-08-05 NOTE — Telephone Encounter (Signed)
Patient called saying he needs a letter for Oct 20th to Oct 23nd. For work. Because he was unable to go back to work due to the gout. Please advise or follow up, Thank you.

## 2016-08-06 NOTE — Telephone Encounter (Signed)
Pt did not answer any phone number listed.  I will write when he is ready to return. Shredding last letter printed and signed.

## 2016-08-06 NOTE — Telephone Encounter (Signed)
Letter printed and given to PCP for signature.

## 2016-08-06 NOTE — Telephone Encounter (Signed)
Pt called back and asked if you can add Oct 24th to the letter as well because he still is not ready to go back to work. If so can you you do that and he will come by to pick up when ready. Thanks.

## 2016-08-13 ENCOUNTER — Other Ambulatory Visit: Payer: Self-pay | Admitting: *Deleted

## 2016-08-13 ENCOUNTER — Other Ambulatory Visit: Payer: Self-pay | Admitting: Internal Medicine

## 2016-08-13 DIAGNOSIS — M549 Dorsalgia, unspecified: Secondary | ICD-10-CM

## 2016-08-13 MED ORDER — HYDROCODONE-ACETAMINOPHEN 10-325 MG PO TABS: 1.0000 | ORAL_TABLET | Freq: Three times a day (TID) | ORAL | 0 refills | Status: DC | PRN

## 2016-08-13 NOTE — Telephone Encounter (Signed)
Hydrocodone - done Xanax - has been discontinued

## 2016-08-13 NOTE — Telephone Encounter (Signed)
Rec'd call pt requesting refill on Hydrocodone & Xanax...Raechel Chute/lmb

## 2016-08-15 MED ORDER — HYDROCODONE-ACETAMINOPHEN 10-325 MG PO TABS: 1.0000 | ORAL_TABLET | Freq: Three times a day (TID) | ORAL | 0 refills | Status: DC | PRN

## 2016-08-15 NOTE — Telephone Encounter (Signed)
[name] is filling a short script for pt in PCP absence. The original script hasn't been found. The 08/13/2016 script for Hydrocod[facility].

## 2016-08-15 NOTE — Telephone Encounter (Signed)
I am printing the remaining amount with PCP signature as #[phone]

## 2016-08-15 NOTE — Addendum Note (Signed)
Addended by: Radford PaxAIRRIKIER DAVIDSON, Kalley Nicholl M on: 08/15/2016 05:04 PM   Modules accepted: Orders

## 2016-08-15 NOTE — Telephone Encounter (Signed)
Patient called in regard to script.  Can not find this script.  Can you please see if you can find it and call patient once ready.  Thanks!

## 2016-08-19 NOTE — Telephone Encounter (Signed)
Called numbers listed but none were connected or available. Called emergency contact for pt [name]([name]).  LMOM for [name] to call back.   RE: rx is up front and ready for pick up.

## 2016-09-16 ENCOUNTER — Telehealth: Payer: Self-pay | Admitting: *Deleted

## 2016-09-16 NOTE — Telephone Encounter (Signed)
Left msg on triage requesting refill on his Hydrocodone & if MD have samples of Janumet want to see if he can have some samples...Raechel Chute/lmb

## 2016-09-17 ENCOUNTER — Other Ambulatory Visit: Payer: Self-pay | Admitting: Internal Medicine

## 2016-09-17 DIAGNOSIS — M549 Dorsalgia, unspecified: Secondary | ICD-10-CM

## 2016-09-17 MED ORDER — HYDROCODONE-ACETAMINOPHEN 10-325 MG PO TABS: 1.0000 | ORAL_TABLET | Freq: Three times a day (TID) | ORAL | 0 refills | Status: DC | PRN

## 2016-09-17 NOTE — Telephone Encounter (Signed)
RX written I don't have that strength of janumet at this time

## 2016-09-17 NOTE — Telephone Encounter (Signed)
Called pt no answer can't leave msg on vm due to vm not set-up. Pt left msg will pick-up on Friday. Place script up front for pick-up...[name]/lmb

## 2016-10-01 ENCOUNTER — Telehealth: Payer: Self-pay | Admitting: Internal Medicine

## 2016-10-01 NOTE — Telephone Encounter (Signed)
Patient states he has ran out of janumet and would like to know if Dr. Yetta BarreJones has more samples.

## 2016-10-02 NOTE — Telephone Encounter (Signed)
Patient is calling back about this. Please advise thanks.

## 2016-10-04 NOTE — Telephone Encounter (Signed)
Patient called to follow up  °

## 2016-10-04 NOTE — Telephone Encounter (Signed)
Left a detailed message stating that PCP does not have any samples of Janumet.   Asked that if he needs an rx sent to the pharmacy to let us know.

## 2016-10-16 ENCOUNTER — Other Ambulatory Visit: Payer: Self-pay | Admitting: Internal Medicine

## 2016-10-16 ENCOUNTER — Telehealth: Payer: Self-pay | Admitting: *Deleted

## 2016-10-16 DIAGNOSIS — M549 Dorsalgia, unspecified: Secondary | ICD-10-CM

## 2016-10-16 MED ORDER — HYDROCODONE-ACETAMINOPHEN 10-325 MG PO TABS: 1.0000 | ORAL_TABLET | Freq: Three times a day (TID) | ORAL | 0 refills | Status: DC | PRN

## 2016-10-16 NOTE — Telephone Encounter (Signed)
Notified pt w/MD response. Pt coming tomorrow for UDS...Raechel Chute/lmb

## 2016-10-16 NOTE — Telephone Encounter (Signed)
RX written I don't get Janumet samples anymore

## 2016-10-16 NOTE — Telephone Encounter (Signed)
Put rx up front for pick-up.../lmb 

## 2016-10-16 NOTE — Telephone Encounter (Signed)
Pt left msg on triage requestign refill on his Hydrocodone, and samples of Janumet. Last UDS 07/25/15...Raechel Chute/lmb

## 2016-10-16 NOTE — Telephone Encounter (Signed)
He needs a UDS 

## 2016-10-17 ENCOUNTER — Ambulatory Visit: Payer: Self-pay

## 2016-10-22 ENCOUNTER — Encounter: Payer: Self-pay | Admitting: Internal Medicine

## 2016-10-22 ENCOUNTER — Telehealth: Payer: Self-pay

## 2016-10-22 NOTE — Telephone Encounter (Signed)
LVM for pt to call back as soon as possible.   RE: savings card up front for Janumet.

## 2016-11-07 ENCOUNTER — Ambulatory Visit: Payer: Self-pay | Admitting: Internal Medicine

## 2016-11-14 ENCOUNTER — Other Ambulatory Visit (INDEPENDENT_AMBULATORY_CARE_PROVIDER_SITE_OTHER): Payer: Self-pay

## 2016-11-14 ENCOUNTER — Encounter: Payer: Self-pay | Admitting: Internal Medicine

## 2016-11-14 ENCOUNTER — Ambulatory Visit (INDEPENDENT_AMBULATORY_CARE_PROVIDER_SITE_OTHER): Payer: Self-pay | Admitting: Internal Medicine

## 2016-11-14 VITALS — BP 150/100 | HR 84 | Temp 98.7°F | Resp 16 | Ht 71.0 in | Wt 210.8 lb

## 2016-11-14 DIAGNOSIS — E781 Pure hyperglyceridemia: Secondary | ICD-10-CM

## 2016-11-14 DIAGNOSIS — M1A0790 Idiopathic chronic gout, unspecified ankle and foot, without tophus (tophi): Secondary | ICD-10-CM

## 2016-11-14 DIAGNOSIS — F418 Other specified anxiety disorders: Secondary | ICD-10-CM

## 2016-11-14 DIAGNOSIS — I10 Essential (primary) hypertension: Secondary | ICD-10-CM

## 2016-11-14 DIAGNOSIS — E118 Type 2 diabetes mellitus with unspecified complications: Secondary | ICD-10-CM

## 2016-11-14 DIAGNOSIS — R7989 Other specified abnormal findings of blood chemistry: Secondary | ICD-10-CM

## 2016-11-14 DIAGNOSIS — M1000 Idiopathic gout, unspecified site: Secondary | ICD-10-CM

## 2016-11-14 DIAGNOSIS — E785 Hyperlipidemia, unspecified: Secondary | ICD-10-CM

## 2016-11-14 DIAGNOSIS — M549 Dorsalgia, unspecified: Secondary | ICD-10-CM

## 2016-11-14 LAB — POCT GLUCOSE (DEVICE FOR HOME USE): Glucose Fasting, POC: 140 mg/dL — AB (ref 70–99)

## 2016-11-14 LAB — CBC WITH DIFFERENTIAL/PLATELET
BASOS ABS: 0.1 10*3/uL (ref 0.0–0.1)
BASOS PCT: 0.7 % (ref 0.0–3.0)
EOS ABS: 0.2 10*3/uL (ref 0.0–0.7)
Eosinophils Relative: 3 % (ref 0.0–5.0)
HCT: 46.2 % (ref 39.0–52.0)
Hemoglobin: 16.1 g/dL (ref 13.0–17.0)
LYMPHS ABS: 3.2 10*3/uL (ref 0.7–4.0)
Lymphocytes Relative: 42.1 % (ref 12.0–46.0)
MCHC: 34.8 g/dL (ref 30.0–36.0)
MCV: 92.5 fl (ref 78.0–100.0)
MONOS PCT: 7.8 % (ref 3.0–12.0)
Monocytes Absolute: 0.6 10*3/uL (ref 0.1–1.0)
NEUTROS ABS: 3.5 10*3/uL (ref 1.4–7.7)
NEUTROS PCT: 46.4 % (ref 43.0–77.0)
PLATELETS: 206 10*3/uL (ref 150.0–400.0)
RBC: 5 Mil/uL (ref 4.22–5.81)
RDW: 12.9 % (ref 11.5–15.5)
WBC: 7.5 10*3/uL (ref 4.0–10.5)

## 2016-11-14 LAB — POCT GLYCOSYLATED HEMOGLOBIN (HGB A1C): Hemoglobin A1C: 10.7

## 2016-11-14 MED ORDER — HYDROCODONE-ACETAMINOPHEN 10-325 MG PO TABS: 1.0000 | ORAL_TABLET | Freq: Three times a day (TID) | ORAL | 0 refills | Status: DC | PRN

## 2016-11-14 MED ORDER — INSULIN GLARGINE 300 UNIT/ML ~~LOC~~ SOPN: 30.0000 [IU] | PEN_INJECTOR | Freq: Every day | SUBCUTANEOUS | 11 refills | Status: DC

## 2016-11-14 MED ORDER — TELMISARTAN 40 MG PO TABS: 40.0000 mg | ORAL_TABLET | Freq: Every day | ORAL | 1 refills | Status: DC

## 2016-11-14 MED ORDER — SITAGLIP PHOS-METFORMIN HCL ER 100-1000 MG PO TB24: 1.0000 | ORAL_TABLET | Freq: Every day | ORAL | 1 refills | Status: DC

## 2016-11-14 MED ORDER — CANAGLIFLOZIN 100 MG PO TABS: 100.0000 mg | ORAL_TABLET | Freq: Every day | ORAL | 1 refills | Status: DC

## 2016-11-14 MED ORDER — SERTRALINE HCL 50 MG PO TABS: 50.0000 mg | ORAL_TABLET | Freq: Every day | ORAL | 1 refills | Status: DC

## 2016-11-14 MED ORDER — COLCHICINE 0.6 MG PO TABS: 0.6000 mg | ORAL_TABLET | Freq: Two times a day (BID) | ORAL | 1 refills | Status: AC

## 2016-11-14 MED ORDER — TRAZODONE HCL 100 MG PO TABS: 200.0000 mg | ORAL_TABLET | Freq: Every evening | ORAL | 1 refills | Status: DC | PRN

## 2016-11-14 NOTE — Progress Notes (Signed)
Subjective:  Patient ID: [name], male    DOB: 1965/11/20  Age: 51 y.o. MRN: [id number]  CC: Hypertension; Hyperlipidemia; and Diabetes   HPI Mahlik Lenn presents for follow-up. He is not doing well. He is uninsured and has no resources to buy medications. His blood sugars and blood pressure have not been well controlled. He complains of polys and blurred vision. 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 He complains of worsening diabetic foot pain. He also has chronic, nonradiating low back pain and gets good symptom relief with the occasional dose of Norco.  Outpatient Medications Prior to Visit  Medication Sig Dispense Refill  . aspirin 81 MG tablet Take 1 tablet (81 mg total) by mouth daily. 30 tablet 11  . metoprolol succinate (TOPROL-XL) 100 MG 24 hr tablet Take 2 tablets (200 mg total) by mouth daily. Take with or immediately following a meal. 180 tablet 2  . Azilsartan-Chlorthalidone (EDARBYCLOR) 40-12.5 MG TABS Take 1 tablet by mouth daily. 90 tablet 1  . colchicine 0.6 MG tablet Take 1 tablet (0.6 mg total) by mouth 2 (two) times daily. 180 tablet 1  . HYDROcodone-acetaminophen (NORCO) 10-325 MG tablet Take 1 tablet by mouth every 8 (eight) hours as needed. 90 tablet 0  . Icosapent Ethyl (VASCEPA) 1 G CAPS Take 2 capsules by mouth 2 (two) times daily. 120 capsule 11  . traZODone (DESYREL) 100 MG tablet Take 2 tablets (200 mg total) by mouth at bedtime as needed for sleep. 180 tablet 1  . allopurinol (ZYLOPRIM) 100 MG tablet Take 1 tablet (100 mg total) by mouth daily. (Patient not taking: Reported on 11/14/2016) 30 tablet 11  . Insulin Pen Needle (NOVOFINE) 32G X 6 MM MISC 1 Act by Does not apply route daily. Use 1 QD 100 each 3  . SitaGLIPtin-MetFORMIN HCl (JANUMET XR) [phone] MG TB24 Take 1 tablet by mouth daily. (Patient not taking: Reported on 11/14/2016) 90 tablet 1   No facility-administered medications prior to visit.     ROS Review of  Systems  Constitutional: Negative for appetite change, diaphoresis, fatigue and unexpected weight change.  HENT: Negative.   Eyes: Positive for visual disturbance.  Respiratory: Negative for cough, chest tightness, shortness of breath, wheezing and stridor.   Cardiovascular: Negative for chest pain, palpitations and leg swelling.  Gastrointestinal: Negative for abdominal pain, constipation, diarrhea, nausea and vomiting.  Endocrine: Positive for polydipsia, polyphagia and polyuria.  Genitourinary: Negative.  Negative for difficulty urinating.  Musculoskeletal: Positive for back pain. Negative for arthralgias, joint swelling and myalgias.  Neurological: Positive for numbness. Negative for dizziness, weakness and headaches.       He complains of numbness and tingling in his feet, worse on the left than the right  Hematological: Negative for adenopathy. Does not bruise/bleed easily.  Psychiatric/Behavioral: Positive for dysphoric mood. Negative for confusion, decreased concentration, hallucinations, self-injury, sleep disturbance and suicidal ideas. The patient is nervous/anxious. The patient is not hyperactive.        He complains of irritability and anger    Objective:  BP (!) 150/100 (BP Location: Left Arm, Patient Position: Sitting, Cuff Size: Normal)   Pulse 84   Temp 98.7 F (37.1 C) (Oral)   Resp 16   Ht 5\' 11"  (1.803 m)   Wt 210 lb 12 oz (95.6 kg)   SpO2 98%   BMI 29.39 kg/m   BP Readings from Last 3 Encounters:  11/14/16 (!) 150/100  07/18/16 (!) 160/116  03/20/16  120/90    Wt Readings from Last 3 Encounters:  11/14/16 210 lb 12 oz (95.6 kg)  07/18/16 213 lb 8 oz (96.8 kg)  03/20/16 222 lb (100.7 kg)    Physical Exam  Constitutional: He is oriented to person, place, and time. No distress.  HENT:  Mouth/Throat: Oropharynx is clear and moist. No oropharyngeal exudate.  Eyes: Conjunctivae are normal. Right eye exhibits no discharge. Left eye exhibits no discharge. No  scleral icterus.  Neck: Normal range of motion. Neck supple. No JVD present. No tracheal deviation present. No thyromegaly present.  Cardiovascular: Normal rate, regular rhythm, normal heart sounds and intact distal pulses.  Exam reveals no gallop and no friction rub.   No murmur heard. Pulmonary/Chest: Effort normal and breath sounds normal. No stridor. No respiratory distress. He has no wheezes. He has no rales. He exhibits no tenderness.  Abdominal: Soft. Bowel sounds are normal. He exhibits no distension and no mass. There is no tenderness. There is no rebound and no guarding.  Musculoskeletal: Normal range of motion. He exhibits no edema or tenderness.  Lymphadenopathy:    He has no cervical adenopathy.  Neurological: He is oriented to person, place, and time.  Skin: Skin is warm and dry. No rash noted. He is not diaphoretic. No erythema. No pallor.  Psychiatric: He has a normal mood and affect. His behavior is normal. Judgment and thought content normal.  Vitals reviewed.   Lab Results  Component Value Date   WBC 7.5 11/14/2016   HGB 16.1 11/14/2016   HCT 46.2 11/14/2016   PLT 206.0 11/14/2016   GLUCOSE 143 (H) 11/14/2016   CHOL 153 11/14/2016   TRIG (H) 11/14/2016    477.0 Triglyceride is over 400; calculations on Lipids are invalid.   HDL 34.00 (L) 11/14/2016   LDLDIRECT 56.0 11/14/2016   ALT 32 12/01/2014   AST 23 12/01/2014   NA 139 11/14/2016   K 4.0 11/14/2016   CL 101 11/14/2016   CREATININE 0.88 11/14/2016   BUN 9 11/14/2016   CO2 27 11/14/2016   TSH 1.04 03/09/2015   INR 1.3 01/13/2009   HGBA1C 10.3 (H) 11/14/2016   MICROALBUR 2.6 (H) 07/18/2016    Dg Lumbar Spine Complete  Result Date: 12/02/2014 CLINICAL DATA:  Low back pain with left-sided sciatica for 2 months EXAM: LUMBAR SPINE - COMPLETE 4+ VIEW COMPARISON:  None. FINDINGS: Frontal, lateral, spot lumbosacral lateral, bilateral oblique views were obtained. There are 6 non-rib-bearing lumbar type  vertebral bodies. There is lower lumbar levoscoliosis. There is postoperative change at L6-S1. There is no fracture or spondylolisthesis. There is moderate disc space narrowing at L6-S1. There is slightly milder narrowing at L4-5 and L5-6. 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 IMPRESSION: Postoperative change and osteoarthritic change. Mild levoscoliosis. No fracture or spondylolisthesis. Electronically Signed   By: Bretta Bang III M.D.   On: 12/02/2014 08:32    Assessment & Plan:   Rolfe was seen today for hypertension, hyperlipidemia and diabetes.  Diagnoses and all orders for this visit:  Essential hypertension, benign- his blood pressure is not adequately well controlled, his electrolytes and renal function are normal. I've asked him to start a generic ARB. -     Basic metabolic panel; Future -     CBC with Differential/Platelet; Future -     telmisartan (MICARDIS) 40 MG tablet; Take 1 tablet (40 mg total) by mouth daily.  Type 2 diabetes mellitus with complication, without long-term current use of insulin (HCC)-  his A1c is up to 10.7% and he has symptoms of glucose toxicity so I have asked him to start a basal insulin, he was given his first dose in the office today, I also gave him samples of his oral medications and [facility] a Child psychotherapistsocial worker about his financial predicament. -     Basic metabolic panel; Future -     Hemoglobin A1c; Future -     Ambulatory referral to Ophthalmology -     POCT glycosylated hemoglobin (Hb A1C) -     POCT Glucose (Device for Home Use) -     SitaGLIPtin-MetFORMIN HCl (JANUMET XR) [phone] MG TB24; Take 1 tablet by mouth daily. -     canagliflozin (INVOKANA) 100 MG TABS tablet; Take 1 tablet (100 mg total) by mouth daily before breakfast. -     [facility] -     Insulin Glargine (TOUJEO SOLOSTAR) 300 UNIT/ML SOPN; Inject 30 Units into the skin daily. -     telmisartan (MICARDIS) 40 MG tablet;  Take 1 tablet (40 mg total) by mouth daily.  Hypertriglyceridemia- he is not willing to take official supplement for this. -     Lipid panel; Future  Hyperlipidemia with target LDL less than 100- he has achieved his LDL goal and is not willing to take a statin. -     Lipid panel; Future  Depression with anxiety- will continue trazodone and Avastin to start taking an SSRI. -     traZODone (DESYREL) 100 MG tablet; Take 2 tablets (200 mg total) by mouth at bedtime as needed for sleep. -     sertraline (ZOLOFT) 50 MG tablet; Take 1 tablet (50 mg total) by mouth daily.  Idiopathic gout, unspecified chronicity, unspecified site -     colchicine 0.6 MG tablet; Take 1 tablet (0.6 mg total) by mouth 2 (two) times daily. -     Uric acid; Future  Idiopathic chronic gout of foot without tophus, unspecified laterality- he is uric acid level is elevated at 8.4, I've asked him to restart allopurinol -     colchicine 0.6 MG tablet; Take 1 tablet (0.6 mg total) by mouth 2 (two) times daily. -     Uric acid; Future  Back pain without radiation -     Discontinue: HYDROcodone-acetaminophen (NORCO) 10-325 MG tablet; Take 1 tablet by mouth every 8 (eight) hours as needed. -     Discontinue: HYDROcodone-acetaminophen (NORCO) 10-325 MG tablet; Take 1 tablet by mouth every 8 (eight) hours as needed. -     HYDROcodone-acetaminophen (NORCO) 10-325 MG tablet; Take 1 tablet by mouth every 8 (eight) hours as needed.   I have discontinued Mr. Cephus SlaterCollis's Icosapent Ethyl, Insulin Pen Needle, and Azilsartan-Chlorthalidone. I am also having him start on sertraline, canagliflozin, Insulin Glargine, and telmisartan. Additionally, I am having him maintain his aspirin, metoprolol succinate, allopurinol, SitaGLIPtin-MetFORMIN HCl, traZODone, colchicine, and HYDROcodone-acetaminophen.  Meds ordered this encounter  Medications  . SitaGLIPtin-MetFORMIN HCl (JANUMET XR) [phone] MG TB24    Sig: Take 1 tablet by mouth daily.     Dispense:  90 tablet    Refill:  1  . traZODone (DESYREL) 100 MG tablet    Sig: Take 2 tablets (200 mg total) by mouth at bedtime as needed for sleep.    Dispense:  180 tablet    Refill:  1  . sertraline (ZOLOFT) 50 MG tablet    Sig: Take 1 tablet (50 mg total) by mouth  daily.    Dispense:  90 tablet    Refill:  1  . canagliflozin (INVOKANA) 100 MG TABS tablet    Sig: Take 1 tablet (100 mg total) by mouth daily before breakfast.    Dispense:  90 tablet    Refill:  1  . colchicine 0.6 MG tablet    Sig: Take 1 tablet (0.6 mg total) by mouth 2 (two) times daily.    Dispense:  180 tablet    Refill:  1  . DISCONTD: HYDROcodone-acetaminophen (NORCO) 10-325 MG tablet    Sig: Take 1 tablet by mouth every 8 (eight) hours as needed.    Dispense:  90 tablet    Refill:  0  . DISCONTD: HYDROcodone-acetaminophen (NORCO) 10-325 MG tablet    Sig: Take 1 tablet by mouth every 8 (eight) hours as needed.    Dispense:  90 tablet    Refill:  0  . HYDROcodone-acetaminophen (NORCO) 10-325 MG tablet    Sig: Take 1 tablet by mouth every 8 (eight) hours as needed.    Dispense:  90 tablet    Refill:  0  . Insulin Glargine (TOUJEO SOLOSTAR) 300 UNIT/ML SOPN    Sig: Inject 30 Units into the skin daily.    Dispense:  1.5 mL    Refill:  11  . telmisartan (MICARDIS) 40 MG tablet    Sig: Take 1 tablet (40 mg total) by mouth daily.    Dispense:  90 tablet    Refill:  1     Follow-up: Return in about 3 months (around 02/11/2017).  Sanda Linger, MD

## 2016-11-14 NOTE — Patient Instructions (Signed)

## 2016-11-14 NOTE — Progress Notes (Signed)
Pre visit review using our clinic review tool, if applicable. No additional management support is needed unless otherwise documented below in the visit note. 

## 2016-11-15 LAB — BASIC METABOLIC PANEL
BUN: 9 mg/dL (ref 6–23)
CALCIUM: 9.3 mg/dL (ref 8.4–10.5)
CO2: 27 meq/L (ref 19–32)
Chloride: 101 mEq/L (ref 96–112)
Creatinine, Ser: 0.88 mg/dL (ref 0.40–1.50)
GFR: 97.22 mL/min (ref >60.00)
GLUCOSE: 143 mg/dL — AB (ref 70–99)
Potassium: 4 mEq/L (ref 3.5–5.1)
Sodium: 139 mEq/L (ref 135–145)

## 2016-11-15 LAB — LIPID PANEL
Cholesterol: 153 mg/dL (ref 0–200)
HDL: 34 mg/dL — ABNORMAL LOW (ref >39.00)
Total CHOL/HDL Ratio: 5

## 2016-11-15 LAB — HEMOGLOBIN A1C: Hgb A1c MFr Bld: 10.3 % — ABNORMAL HIGH (ref 4.6–6.5)

## 2016-11-15 LAB — LDL CHOLESTEROL, DIRECT: Direct LDL: 56 mg/dL

## 2016-11-15 LAB — URIC ACID: Uric Acid, Serum: 8.4 mg/dL — ABNORMAL HIGH (ref 4.0–7.8)

## 2016-11-16 MED ORDER — ALLOPURINOL 100 MG PO TABS: 100.0000 mg | ORAL_TABLET | Freq: Every day | ORAL | 3 refills | Status: DC

## 2016-11-26 ENCOUNTER — Other Ambulatory Visit: Payer: Self-pay | Admitting: Internal Medicine

## 2016-11-26 DIAGNOSIS — I10 Essential (primary) hypertension: Secondary | ICD-10-CM

## 2017-02-12 ENCOUNTER — Ambulatory Visit (INDEPENDENT_AMBULATORY_CARE_PROVIDER_SITE_OTHER): Payer: Self-pay | Admitting: Internal Medicine

## 2017-02-12 ENCOUNTER — Encounter: Payer: Self-pay | Admitting: Internal Medicine

## 2017-02-12 ENCOUNTER — Other Ambulatory Visit (INDEPENDENT_AMBULATORY_CARE_PROVIDER_SITE_OTHER): Payer: Self-pay

## 2017-02-12 VITALS — BP 166/106 | HR 69 | Temp 98.5°F | Resp 16 | Ht 71.0 in | Wt 210.0 lb

## 2017-02-12 DIAGNOSIS — I10 Essential (primary) hypertension: Secondary | ICD-10-CM

## 2017-02-12 DIAGNOSIS — R0789 Other chest pain: Secondary | ICD-10-CM

## 2017-02-12 DIAGNOSIS — E118 Type 2 diabetes mellitus with unspecified complications: Secondary | ICD-10-CM

## 2017-02-12 DIAGNOSIS — M1A0790 Idiopathic chronic gout, unspecified ankle and foot, without tophus (tophi): Secondary | ICD-10-CM

## 2017-02-12 DIAGNOSIS — R9431 Abnormal electrocardiogram [ECG] [EKG]: Secondary | ICD-10-CM

## 2017-02-12 DIAGNOSIS — E1140 Type 2 diabetes mellitus with diabetic neuropathy, unspecified: Secondary | ICD-10-CM

## 2017-02-12 DIAGNOSIS — M1000 Idiopathic gout, unspecified site: Secondary | ICD-10-CM

## 2017-02-12 DIAGNOSIS — M549 Dorsalgia, unspecified: Secondary | ICD-10-CM

## 2017-02-12 LAB — BASIC METABOLIC PANEL
BUN: 10 mg/dL (ref 6–23)
CALCIUM: 9.5 mg/dL (ref 8.4–10.5)
CO2: 30 mEq/L (ref 19–32)
CREATININE: 1.04 mg/dL (ref 0.40–1.50)
Chloride: 103 mEq/L (ref 96–112)
GFR: 80.1 mL/min (ref >60.00)
Glucose, Bld: 145 mg/dL — ABNORMAL HIGH (ref 70–99)
Potassium: 3.6 mEq/L (ref 3.5–5.1)
Sodium: 140 mEq/L (ref 135–145)

## 2017-02-12 LAB — HEMOGLOBIN A1C: HEMOGLOBIN A1C: 8.8 % — AB (ref 4.6–6.5)

## 2017-02-12 LAB — TROPONIN I: TNIDX: 0.01 ug/l (ref 0.00–0.06)

## 2017-02-12 LAB — CARDIAC PANEL
CK-MB: 1.3 ng/mL (ref 0.3–4.0)
Relative Index: 2.1 calc (ref 0.0–2.5)
Total CK: 63 U/L (ref 7–232)

## 2017-02-12 MED ORDER — GABAPENTIN 100 MG PO CAPS: 100.0000 mg | ORAL_CAPSULE | Freq: Three times a day (TID) | ORAL | 3 refills | Status: AC

## 2017-02-12 MED ORDER — ALLOPURINOL 100 MG PO TABS: 100.0000 mg | ORAL_TABLET | Freq: Every day | ORAL | 3 refills | Status: AC

## 2017-02-12 MED ORDER — HYDROCODONE-ACETAMINOPHEN 10-325 MG PO TABS: 1.0000 | ORAL_TABLET | Freq: Three times a day (TID) | ORAL | 0 refills | Status: DC | PRN

## 2017-02-12 MED ORDER — TELMISARTAN 40 MG PO TABS: 40.0000 mg | ORAL_TABLET | Freq: Every day | ORAL | 1 refills | Status: DC

## 2017-02-12 NOTE — Progress Notes (Signed)
Subjective:  Patient ID: [name], male    DOB: 1965/10/21  Age: 51 y.o. MRN: [id number]  CC: Hypertension and Diabetes   HPI [name] presents for f/up - He complains of intermittent headaches for the last week and tells me his blood pressure has not been well controlled. He also reports a few episodes of tightness in his upper chest on both sides thatt occur at rest. He rarely experiences DOE. He denies diaphoresis, fatigue, edema, or palpitations. He is not taking the ARB. He thinks his blood sugars have been well controlled but the he admits he is not taking Janumet. He complains of worsening sensations in his feet, a little worse on the left than the right, he describes it as a burning/stabbing pain with intermittent numbness. He also complains of chronic, unchanged low back pain and wants a refill on Norco.  Outpatient Medications Prior to Visit  Medication Sig Dispense Refill  . aspirin 81 MG tablet Take 1 tablet (81 mg total) by mouth daily. 30 tablet 11  . canagliflozin (INVOKANA) 100 MG TABS tablet Take 1 tablet (100 mg total) by mouth daily before breakfast. 90 tablet 1  . colchicine 0.6 MG tablet Take 1 tablet (0.6 mg total) by mouth 2 (two) times daily. 180 tablet 1  . Insulin Glargine (TOUJEO SOLOSTAR) 300 UNIT/ML SOPN Inject 30 Units into the skin daily. 1.5 mL 11  . metoprolol succinate (TOPROL-XL) 100 MG 24 hr tablet TAKE 2 TABLETS BY MOUTH DAILY WITH OR IMMEDIATELY FOLLOWING A MEAL 180 tablet 1  . sertraline (ZOLOFT) 50 MG tablet Take 1 tablet (50 mg total) by mouth daily. 90 tablet 1  . SitaGLIPtin-MetFORMIN HCl (JANUMET XR) [phone] MG TB24 Take 1 tablet by mouth daily. 90 tablet 1  . traZODone (DESYREL) 100 MG tablet Take 2 tablets (200 mg total) by mouth at bedtime as needed for sleep. 180 tablet 1  . allopurinol (ZYLOPRIM) 100 MG tablet Take 1 tablet (100 mg total) by mouth daily. 90 tablet 3  . HYDROcodone-acetaminophen (NORCO) 10-325 MG tablet Take 1 tablet  by mouth every 8 (eight) hours as needed. 90 tablet 0  . telmisartan (MICARDIS) 40 MG tablet Take 1 tablet (40 mg total) by mouth daily. 90 tablet 1   No facility-administered medications prior to visit.     ROS Review of Systems  Constitutional: Negative for appetite change, diaphoresis, fatigue and unexpected weight change.  HENT: Negative.   Eyes: Negative.  Negative for visual disturbance.  Respiratory: Positive for chest tightness. Negative for cough, choking, shortness of breath and wheezing.   Cardiovascular: Negative for chest pain, palpitations and leg swelling.  Gastrointestinal: Negative for abdominal pain, constipation, diarrhea, nausea and vomiting.  Endocrine: Negative.   Genitourinary: Negative.  Negative for difficulty urinating.  Musculoskeletal: Positive for back pain. Negative for arthralgias, joint swelling, myalgias and neck pain.  Skin: Negative.   Neurological: Positive for numbness and headaches. Negative for dizziness, tremors and weakness.  Hematological: Negative for adenopathy. Does not bruise/bleed easily.  Psychiatric/Behavioral: Negative.  Negative for dysphoric mood and sleep disturbance. The patient is not nervous/anxious.     Objective:  BP (!) 166/106 (BP Location: Left Arm, Patient Position: Sitting, Cuff Size: Large)   Pulse 69   Temp 98.5 F (36.9 C) (Oral)   Resp 16   Ht  (1.803 m)   Wt 210 lb (95.3 kg)   SpO2 99%   BMI 29.29 kg/m   BP Readings from Last 3 Encounters:  02/12/17 Marland Kitchen)  166/106  11/14/16 (!) 150/100  07/18/16 (!) 160/116    Wt Readings from Last 3 Encounters:  02/12/17 210 lb (95.3 kg)  11/14/16 210 lb 12 oz (95.6 kg)  07/18/16 213 lb 8 oz (96.8 kg)    Physical Exam  Constitutional: No distress.  HENT:  Head: Normocephalic and atraumatic.  Mouth/Throat: Oropharynx is clear and moist. No oropharyngeal exudate.  Eyes: Conjunctivae are normal. Right eye exhibits no discharge. Left eye exhibits no discharge. No  scleral icterus.  Neck: Normal range of motion. Neck supple. No JVD present. No tracheal deviation present. No thyromegaly present.  Cardiovascular: Normal rate, regular rhythm, normal heart sounds and intact distal pulses.  Exam reveals no gallop and no friction rub.   No murmur heard. EKG --  Sinus  Rhythm  -Left axis.   -  Nonspecific T-abnormality.   ABNORMAL - there is no old EKG for comparison  Pulmonary/Chest: Effort normal and breath sounds normal. No stridor. No respiratory distress. He has no wheezes. He has no rales. He exhibits no tenderness.  Abdominal: Soft. Bowel sounds are normal. He exhibits no distension and no mass. There is no tenderness. There is no rebound and no guarding.  Musculoskeletal: Normal range of motion. He exhibits no edema, tenderness or deformity.  Lymphadenopathy:    He has no cervical adenopathy.  Neurological: He is alert. He has normal strength. He displays no atrophy, no tremor and normal reflexes. No cranial nerve deficit or sensory deficit. He exhibits normal muscle tone. He displays a negative Romberg sign. He displays no seizure activity. Coordination and gait normal. He displays no Babinski's sign on the right side. He displays no Babinski's sign on the left side.  Reflex Scores:      Tricep reflexes are 0 on the right side and 0 on the left side.      Bicep reflexes are 0 on the right side and 0 on the left side.      Brachioradialis reflexes are 0 on the right side and 0 on the left side.      Patellar reflexes are 0 on the right side and 0 on the left side.      Achilles reflexes are 0 on the right side and 0 on the left side. Skin: Skin is warm and dry. No rash noted. He is not diaphoretic. No erythema. No pallor.  Vitals reviewed.   Lab Results  Component Value Date   WBC 7.5 11/14/2016   HGB 16.1 11/14/2016   HCT 46.2 11/14/2016   PLT 206.0 11/14/2016   GLUCOSE 145 (H) 02/12/2017   CHOL 153 11/14/2016   TRIG (H) 11/14/2016     477.0 Triglyceride is over 400; calculations on Lipids are invalid.   HDL 34.00 (L) 11/14/2016   LDLDIRECT 56.0 11/14/2016   ALT 32 12/01/2014   AST 23 12/01/2014   NA 140 02/12/2017   K 3.6 02/12/2017   CL 103 02/12/2017   CREATININE 1.04 02/12/2017   BUN 10 02/12/2017   CO2 30 02/12/2017   TSH 1.04 03/09/2015   INR 1.3 01/13/2009   HGBA1C 8.8 (H) 02/12/2017   MICROALBUR 2.6 (H) 07/18/2016    Dg Lumbar Spine Complete  Result Date: 12/02/2014 CLINICAL DATA:  Low back pain with left-sided sciatica for 2 months EXAM: LUMBAR SPINE - COMPLETE 4+ VIEW COMPARISON:  None. FINDINGS: Frontal, lateral, spot lumbosacral lateral, bilateral oblique views were obtained. There are 6 non-rib-bearing lumbar type vertebral bodies. There is lower lumbar levoscoliosis. There is  postoperative change at L6-S1. There is no fracture or spondylolisthesis. There is moderate disc space narrowing at L6-S1. There is slightly milder narrowing at L4-5 and L5-6. 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 IMPRESSION: Postoperative change and osteoarthritic change. Mild levoscoliosis. No fracture or spondylolisthesis. Electronically Signed   By: Bretta Bang III M.D.   On: 12/02/2014 08:32    Assessment & Plan:   Selim was seen today for hypertension and diabetes.  Diagnoses and all orders for this visit:  Diabetic neuropathy, painful (HCC)- I encouraged him to attempt to get better blood sugar control, for now will treat the symptoms with gabapentin-starting a low-dose and increasing the dose as needed and tolerated -     Hemoglobin A1c; Future -     HYDROcodone-acetaminophen (NORCO) 10-325 MG tablet; Take 1 tablet by mouth every 8 (eight) hours as needed. -     gabapentin (NEURONTIN) 100 MG capsule; Take 1 capsule (100 mg total) by mouth 3 (three) times daily.  Essential hypertension, benign- his blood pressure is not adequately well controlled, he agrees to start the ARB. -     Basic  metabolic panel; Future -     telmisartan (MICARDIS) 40 MG tablet; Take 1 tablet (40 mg total) by mouth daily.  Chest tightness- his chest pain is not typical for angina in that it occurs at rest but he also has some DOE and has multiple risk factors for coronary artery disease, his cardiac enzymes today are negative, will screen for ischemia with a Lexiscan. -     EKG 12-Lead -     Troponin I; Future -     Cardiac panel; Future -     Myocardial Perfusion Imaging; Future  Idiopathic chronic gout of foot without tophus, unspecified laterality -     allopurinol (ZYLOPRIM) 100 MG tablet; Take 1 tablet (100 mg total) by mouth daily.  Idiopathic gout, unspecified chronicity, unspecified site -     allopurinol (ZYLOPRIM) 100 MG tablet; Take 1 tablet (100 mg total) by mouth daily.  Back pain without radiation -     HYDROcodone-acetaminophen (NORCO) 10-325 MG tablet; Take 1 tablet by mouth every 8 (eight) hours as needed.  Type 2 diabetes mellitus with complication, without long-term current use of insulin (HCC)- his A1c is up to 8.8%, his blood sugars are not well-controlled, he agrees to start the Janumet, and to continue Invokana and Toujeo. -     telmisartan (MICARDIS) 40 MG tablet; Take 1 tablet (40 mg total) by mouth daily.  Abnormal EKG- as above -     Myocardial Perfusion Imaging; Future   I am having Mr. Downum start on gabapentin. I am also having him maintain his aspirin, SitaGLIPtin-MetFORMIN HCl, traZODone, sertraline, canagliflozin, colchicine, Insulin Glargine, metoprolol succinate, allopurinol, HYDROcodone-acetaminophen, and telmisartan.  Meds ordered this encounter  Medications  . allopurinol (ZYLOPRIM) 100 MG tablet    Sig: Take 1 tablet (100 mg total) by mouth daily.    Dispense:  90 tablet    Refill:  3  . HYDROcodone-acetaminophen (NORCO) 10-325 MG tablet    Sig: Take 1 tablet by mouth every 8 (eight) hours as needed.    Dispense:  90 tablet    Refill:  0  .  gabapentin (NEURONTIN) 100 MG capsule    Sig: Take 1 capsule (100 mg total) by mouth 3 (three) times daily.    Dispense:  90 capsule    Refill:  3  . telmisartan (MICARDIS) 40 MG tablet  Sig: Take 1 tablet (40 mg total) by mouth daily.    Dispense:  90 tablet    Refill:  1     Follow-up: Return in about 6 weeks (around 03/26/2017).  Sanda Linger, MD

## 2017-02-12 NOTE — Progress Notes (Signed)
Pre visit review using our clinic review tool, if applicable. No additional management support is needed unless otherwise documented below in the visit note. 

## 2017-02-12 NOTE — Patient Instructions (Signed)

## 2017-02-13 DIAGNOSIS — R9431 Abnormal electrocardiogram [ECG] [EKG]: Secondary | ICD-10-CM | POA: Insufficient documentation

## 2017-02-20 ENCOUNTER — Telehealth: Payer: Self-pay | Admitting: Internal Medicine

## 2017-02-20 NOTE — Telephone Encounter (Signed)
LVM for pt to call back as soon as possible.  Results have not been released. Will wait for PCP to return

## 2017-02-20 NOTE — Telephone Encounter (Signed)
Please call back with test results from 5/2

## 2017-02-20 NOTE — Telephone Encounter (Signed)
Lab Results  Component Value Date   WBC 7.5 11/14/2016   HGB 16.1 11/14/2016   HCT 46.2 11/14/2016   PLT 206.0 11/14/2016   GLUCOSE 145 (H) 02/12/2017   CHOL 153 11/14/2016   TRIG (H) 11/14/2016    477.0 Triglyceride is over 400; calculations on Lipids are invalid.   HDL 34.00 (L) 11/14/2016   LDLDIRECT 56.0 11/14/2016   ALT 32 12/01/2014   AST 23 12/01/2014   NA 140 02/12/2017   K 3.6 02/12/2017   CL 103 02/12/2017   CREATININE 1.04 02/12/2017   BUN 10 02/12/2017   CO2 30 02/12/2017   TSH 1.04 03/09/2015   INR 1.3 01/13/2009   HGBA1C 8.8 (H) 02/12/2017   MICROALBUR 2.6 (H) 07/18/2016    His triglycerides and blood sugar are too high but everything else is okay

## 2017-02-24 NOTE — Telephone Encounter (Signed)
Pt would like call back about the ekg .  He said while he was in office Dr Yetta Barrejones seen a few things that he didn't like and said he would call back with those results

## 2017-02-24 NOTE — Telephone Encounter (Signed)
LVM for pt to call back as soon as possible.   

## 2017-02-24 NOTE — Telephone Encounter (Signed)
Can you see if pt can get set up for the stress test soon?

## 2017-02-25 ENCOUNTER — Telehealth (HOSPITAL_COMMUNITY): Payer: Self-pay | Admitting: Family Medicine

## 2017-02-25 NOTE — Telephone Encounter (Signed)
Left msg for cardiology to call pt

## 2017-02-27 NOTE — Telephone Encounter (Signed)
02/25/2017 09:12 AM Phone (Outgoing) [name], [name] (Self) [phone] (H)   Left Message - Called pt and lmsg for pt to CB to get scheduled for myoview.     By Trina AoGriffin, Onisha Cedeno A    02/26/2017 11:12 AM Phone (Outgoing) [name], [name] (Self) [phone] (H)   Left Message - Called pt and lmsg for him to CB to get scheduled for myoview.     By Trina AoGriffin, Shaasia Odle A    02/27/2017 09:19 AM Phone (Outgoing) [name], [name] (Self) [phone] (H) Remove  Left Message - Called pt and lmsg for her to CB to get scheduled for a myoview.     By Elita BooneGriffin, Aeron Donaghey A

## 2017-03-13 ENCOUNTER — Telehealth: Payer: Self-pay | Admitting: *Deleted

## 2017-03-13 ENCOUNTER — Other Ambulatory Visit: Payer: Self-pay | Admitting: Internal Medicine

## 2017-03-13 DIAGNOSIS — M549 Dorsalgia, unspecified: Secondary | ICD-10-CM

## 2017-03-13 DIAGNOSIS — E1140 Type 2 diabetes mellitus with diabetic neuropathy, unspecified: Secondary | ICD-10-CM

## 2017-03-13 MED ORDER — HYDROCODONE-ACETAMINOPHEN 10-325 MG PO TABS: 1.0000 | ORAL_TABLET | Freq: Three times a day (TID) | ORAL | 0 refills | Status: DC | PRN

## 2017-03-13 NOTE — Telephone Encounter (Signed)
Rec'd call pt requesting refill on his Hydrocodone.../lmb 

## 2017-03-13 NOTE — Telephone Encounter (Signed)
RX written 

## 2017-03-13 NOTE — Telephone Encounter (Signed)
Notified pt rx ready for pick-up.../lmb 

## 2017-03-18 NOTE — Telephone Encounter (Signed)
Key: [id number]: [id number]  rx required a PA

## 2017-03-24 ENCOUNTER — Telehealth: Payer: Self-pay | Admitting: Internal Medicine

## 2017-03-24 NOTE — Telephone Encounter (Signed)
Michael MossesDiana from the pts rx insurance company called needing additional clinical information for his pts PA. The refrence number that can be used is #[id number]   Her call back number is [phone].

## 2017-03-26 ENCOUNTER — Ambulatory Visit: Payer: Self-pay | Admitting: Internal Medicine

## 2017-03-26 NOTE — Telephone Encounter (Signed)
Called insurance to give the additional clinical information. ICD code M54.9 was given and clinical information was given as well.

## 2017-04-01 ENCOUNTER — Ambulatory Visit: Payer: Self-pay | Admitting: Internal Medicine

## 2017-04-07 ENCOUNTER — Ambulatory Visit: Payer: Self-pay | Admitting: Internal Medicine

## 2017-04-15 ENCOUNTER — Encounter: Payer: Self-pay | Admitting: Internal Medicine

## 2017-04-15 ENCOUNTER — Ambulatory Visit (INDEPENDENT_AMBULATORY_CARE_PROVIDER_SITE_OTHER): Payer: 59 | Admitting: Internal Medicine

## 2017-04-15 ENCOUNTER — Other Ambulatory Visit (INDEPENDENT_AMBULATORY_CARE_PROVIDER_SITE_OTHER): Payer: 59

## 2017-04-15 VITALS — BP 124/88 | HR 69 | Temp 97.9°F | Resp 16 | Ht 71.0 in | Wt 206.0 lb

## 2017-04-15 DIAGNOSIS — S30861A Insect bite (nonvenomous) of abdominal wall, initial encounter: Secondary | ICD-10-CM

## 2017-04-15 DIAGNOSIS — R9431 Abnormal electrocardiogram [ECG] [EKG]: Secondary | ICD-10-CM

## 2017-04-15 DIAGNOSIS — R0789 Other chest pain: Secondary | ICD-10-CM

## 2017-04-15 DIAGNOSIS — M25532 Pain in left wrist: Secondary | ICD-10-CM

## 2017-04-15 DIAGNOSIS — D751 Secondary polycythemia: Secondary | ICD-10-CM | POA: Diagnosis not present

## 2017-04-15 DIAGNOSIS — W57XXXA Bitten or stung by nonvenomous insect and other nonvenomous arthropods, initial encounter: Secondary | ICD-10-CM

## 2017-04-15 DIAGNOSIS — M549 Dorsalgia, unspecified: Secondary | ICD-10-CM | POA: Diagnosis not present

## 2017-04-15 DIAGNOSIS — E1140 Type 2 diabetes mellitus with diabetic neuropathy, unspecified: Secondary | ICD-10-CM | POA: Diagnosis not present

## 2017-04-15 LAB — CBC WITH DIFFERENTIAL/PLATELET
BASOS ABS: 0.1 10*3/uL (ref 0.0–0.1)
Basophils Relative: 0.8 % (ref 0.0–3.0)
EOS ABS: 0.3 10*3/uL (ref 0.0–0.7)
EOS PCT: 3.2 % (ref 0.0–5.0)
HCT: 52.3 % — ABNORMAL HIGH (ref 39.0–52.0)
Hemoglobin: 18.2 g/dL (ref 13.0–17.0)
LYMPHS ABS: 3.7 10*3/uL (ref 0.7–4.0)
Lymphocytes Relative: 39.9 % (ref 12.0–46.0)
MCHC: 34.7 g/dL (ref 30.0–36.0)
MCV: 91.5 fl (ref 78.0–100.0)
MONO ABS: 0.7 10*3/uL (ref 0.1–1.0)
Monocytes Relative: 7 % (ref 3.0–12.0)
NEUTROS PCT: 49.1 % (ref 43.0–77.0)
Neutro Abs: 4.6 10*3/uL (ref 1.4–7.7)
Platelets: 229 10*3/uL (ref 150.0–400.0)
RBC: 5.72 Mil/uL (ref 4.22–5.81)
RDW: 13.7 % (ref 11.5–15.5)
WBC: 9.4 10*3/uL (ref 4.0–10.5)

## 2017-04-15 LAB — COMPREHENSIVE METABOLIC PANEL
ALBUMIN: 4.6 g/dL (ref 3.5–5.2)
ALK PHOS: 73 U/L (ref 39–117)
ALT: 18 U/L (ref 0–53)
AST: 20 U/L (ref 0–37)
BILIRUBIN TOTAL: 0.7 mg/dL (ref 0.2–1.2)
BUN: 11 mg/dL (ref 6–23)
CO2: 31 mEq/L (ref 19–32)
CREATININE: 1.15 mg/dL (ref 0.40–1.50)
Calcium: 9.8 mg/dL (ref 8.4–10.5)
Chloride: 99 mEq/L (ref 96–112)
GFR: 71.27 mL/min (ref >60.00)
GLUCOSE: 232 mg/dL — AB (ref 70–99)
Potassium: 3.4 mEq/L — ABNORMAL LOW (ref 3.5–5.1)
SODIUM: 140 meq/L (ref 135–145)
TOTAL PROTEIN: 8 g/dL (ref 6.0–8.3)

## 2017-04-15 LAB — TROPONIN I: TNIDX: 0.01 ug/L (ref 0.00–0.06)

## 2017-04-15 MED ORDER — HYDROCODONE-ACETAMINOPHEN 10-325 MG PO TABS: 1.0000 | ORAL_TABLET | Freq: Three times a day (TID) | ORAL | 0 refills | Status: DC | PRN

## 2017-04-15 NOTE — Patient Instructions (Signed)

## 2017-04-15 NOTE — Progress Notes (Signed)
Subjective:  Patient ID: [name], male    DOB: 12-31-1965  Age: 51 y.o. MRN: [id number]  CC: Hypertension   HPI Zaveon Gillen presents for a BP check - He complains of recurrent, intermittent, sharp pains in his chest that occur at rest.  He is also had a few episodes of tightness with exertion. When I last saw him I asked him to undergo a Lexiscan but that was not completed because since he didn't have insurance at the time.  He complains that he removed a tick from his lower abdomen about a month ago and has a red itchy spot in the area. He has had no episodes of headache, fever, or chills. He also complains of nontraumatic pain in the medial aspect of his left wrist. He has not noticed any swelling in the area.  Outpatient Medications Prior to Visit  Medication Sig Dispense Refill  . allopurinol (ZYLOPRIM) 100 MG tablet Take 1 tablet (100 mg total) by mouth daily. 90 tablet 3  . aspirin 81 MG tablet Take 1 tablet (81 mg total) by mouth daily. 30 tablet 11  . canagliflozin (INVOKANA) 100 MG TABS tablet Take 1 tablet (100 mg total) by mouth daily before breakfast. 90 tablet 1  . colchicine 0.6 MG tablet Take 1 tablet (0.6 mg total) by mouth 2 (two) times daily. 180 tablet 1  . gabapentin (NEURONTIN) 100 MG capsule Take 1 capsule (100 mg total) by mouth 3 (three) times daily. 90 capsule 3  . Insulin Glargine (TOUJEO SOLOSTAR) 300 UNIT/ML SOPN Inject 30 Units into the skin daily. 1.5 mL 11  . metoprolol succinate (TOPROL-XL) 100 MG 24 hr tablet TAKE 2 TABLETS BY MOUTH DAILY WITH OR IMMEDIATELY FOLLOWING A MEAL 180 tablet 1  . sertraline (ZOLOFT) 50 MG tablet Take 1 tablet (50 mg total) by mouth daily. 90 tablet 1  . SitaGLIPtin-MetFORMIN HCl (JANUMET XR) [phone] MG TB24 Take 1 tablet by mouth daily. 90 tablet 1  . telmisartan (MICARDIS) 40 MG tablet Take 1 tablet (40 mg total) by mouth daily. 90 tablet 1  . traZODone (DESYREL) 100 MG tablet Take 2 tablets (200 mg total) by mouth at  bedtime as needed for sleep. 180 tablet 1  . HYDROcodone-acetaminophen (NORCO) 10-325 MG tablet Take 1 tablet by mouth every 8 (eight) hours as needed. 90 tablet 0   No facility-administered medications prior to visit.     ROS Review of Systems  Constitutional: Negative.  Negative for activity change, appetite change, chills, diaphoresis, fatigue and unexpected weight change.  HENT: Negative.   Eyes: Negative for visual disturbance.  Respiratory: Negative.  Negative for cough, chest tightness, shortness of breath, wheezing and stridor.   Cardiovascular: Positive for chest pain. Negative for palpitations and leg swelling.  Gastrointestinal: Negative.  Negative for abdominal pain, blood in stool, constipation, diarrhea, nausea and vomiting.  Endocrine: Negative.   Genitourinary: Negative.  Negative for difficulty urinating and dysuria.  Musculoskeletal: Positive for arthralgias and back pain. Negative for myalgias and neck pain.  Skin: Negative for color change, pallor and rash.  Allergic/Immunologic: Negative.   Neurological: Negative.  Negative for dizziness, weakness, numbness and headaches.  Hematological: Negative for adenopathy. Does not bruise/bleed easily.  Psychiatric/Behavioral: Positive for sleep disturbance. Negative for agitation, decreased concentration, dysphoric mood, self-injury and suicidal ideas. The patient is not nervous/anxious.     Objective:  BP 124/88 (BP Location: Left Arm, Patient Position: Sitting, Cuff Size: Normal)   Pulse 69   Temp 97.9 F (36.6  C) (Oral)   Resp 16   Ht 5\' 11"  (1.803 m)   Wt 206 lb (93.4 kg)   SpO2 94%   BMI 28.73 kg/m   BP Readings from Last 3 Encounters:  04/15/17 124/88  02/12/17 (!) 166/106  11/14/16 (!) 150/100    Wt Readings from Last 3 Encounters:  04/15/17 206 lb (93.4 kg)  02/12/17 210 lb (95.3 kg)  11/14/16 210 lb 12 oz (95.6 kg)    Physical Exam  Constitutional: He is oriented to person, place, and time. No  distress.  HENT:  Mouth/Throat: Oropharynx is clear and moist. No oropharyngeal exudate.  Eyes: Conjunctivae are normal. Right eye exhibits no discharge. Left eye exhibits no discharge. No scleral icterus.  Neck: Normal range of motion. Neck supple. No JVD present.  Cardiovascular: Regular rhythm and intact distal pulses.  Exam reveals no gallop and no friction rub.   No murmur heard. EKG -  Sinus  Rhythm  -Left axis.   -  T-abnormality  - Anterolateral ischemia.   ABNORMAL - no change compared to the prior EKG   Pulmonary/Chest: Effort normal and breath sounds normal. No respiratory distress. He has no wheezes. He has no rales. He exhibits no tenderness.  Abdominal: Soft. Bowel sounds are normal. He exhibits no distension and no mass. There is no tenderness. There is no rebound and no guarding.    Musculoskeletal: Normal range of motion. He exhibits no edema or tenderness.       Right wrist: Normal. He exhibits normal range of motion, no tenderness, no bony tenderness, no swelling, no effusion, no crepitus, no deformity and no laceration.  Lymphadenopathy:    He has no cervical adenopathy.  Neurological: He is alert and oriented to person, place, and time.  Skin: Skin is warm and dry. No rash noted. He is not diaphoretic. No erythema. No pallor.  Psychiatric: He has a normal mood and affect. His behavior is normal. Judgment and thought content normal.  Vitals reviewed.   Lab Results  Component Value Date   WBC 9.4 04/15/2017   HGB 18.2 Repeated and verified X2. (HH) 04/15/2017   HCT 52.3 (H) 04/15/2017   PLT 229.0 04/15/2017   GLUCOSE 232 (H) 04/15/2017   CHOL 153 11/14/2016   TRIG (H) 11/14/2016    477.0 Triglyceride is over 400; calculations on Lipids are invalid.   HDL 34.00 (L) 11/14/2016   LDLDIRECT 56.0 11/14/2016   ALT 18 04/15/2017   AST 20 04/15/2017   NA 140 04/15/2017   K 3.4 (L) 04/15/2017   CL 99 04/15/2017   CREATININE 1.15 04/15/2017   BUN 11 04/15/2017     CO2 31 04/15/2017   TSH 1.04 03/09/2015   INR 1.3 01/13/2009   HGBA1C 8.8 (H) 02/12/2017   MICROALBUR 2.6 (H) 07/18/2016    Dg Lumbar Spine Complete  Result Date: 12/02/2014 CLINICAL DATA:  Low back pain with left-sided sciatica for 2 months EXAM: LUMBAR SPINE - COMPLETE 4+ VIEW COMPARISON:  None. FINDINGS: Frontal, lateral, spot lumbosacral lateral, bilateral oblique views were obtained. There are 6 non-rib-bearing lumbar type vertebral bodies. There is lower lumbar levoscoliosis. There is postoperative change at L6-S1. There is no fracture or spondylolisthesis. There is moderate disc space narrowing at L6-S1. There is slightly milder narrowing at L4-5 and L5-6. 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 IMPRESSION: Postoperative change and osteoarthritic change. Mild levoscoliosis. No fracture or spondylolisthesis. Electronically Signed   By: Bretta Bang III M.D.   On:  12/02/2014 08:32    Assessment & Plan:   Brynda GreathouseRandall was seen today for hypertension.  Diagnoses and all orders for this visit:  Atypical chest pain- his chest pain is not typical for angina but he does have risk factors for CAD so I have asked to undergo a Lexiscan to screen for CAD, his EKG shows persistent T-wave inversion in the lateral leads which is unchanged compared to an EKG that was done 2 months ago. His troponin level is negative today. -     EKG 12-Lead -     Myocardial Perfusion Imaging; Future -     Troponin I; Future  Abnormal EKG- as above  -     Myocardial Perfusion Imaging; Future -     Troponin I; Future  Chest tightness- as above -     Myocardial Perfusion Imaging; Future -     Troponin I; Future  Acute pain of left wrist- exam is normal, will check a plain film to see if there is any evidence of occult fracture or synovitis. -     DG Wrist Complete Left; Future  Tick bite of abdomen, initial encounter- his exam is not consistent with early Lyme and his Lyme antibodies are  negative. I don't think he needs to take a course of doxycycline. -     Comprehensive metabolic panel; Future -     CBC with Differential/Platelet; Future -     Lyme Ab/Western Blot Reflex; Future  Back pain without radiation -     HYDROcodone-acetaminophen (NORCO) 10-325 MG tablet; Take 1 tablet by mouth every 8 (eight) hours as needed.  Diabetic neuropathy, painful (HCC) -     HYDROcodone-acetaminophen (NORCO) 10-325 MG tablet; Take 1 tablet by mouth every 8 (eight) hours as needed.   I am having Mr. Carrie MewCollis maintain his aspirin, SitaGLIPtin-MetFORMIN HCl, traZODone, sertraline, canagliflozin, colchicine, Insulin Glargine, metoprolol succinate, allopurinol, gabapentin, telmisartan, and HYDROcodone-acetaminophen.  Meds ordered this encounter  Medications  . HYDROcodone-acetaminophen (NORCO) 10-325 MG tablet    Sig: Take 1 tablet by mouth every 8 (eight) hours as needed.    Dispense:  90 tablet    Refill:  0     Follow-up: Return in about 3 weeks (around 05/06/2017).  Sanda Lingerhomas Ruperto Kiernan, MD

## 2017-04-17 DIAGNOSIS — D751 Secondary polycythemia: Secondary | ICD-10-CM | POA: Insufficient documentation

## 2017-04-17 LAB — LYME AB/WESTERN BLOT REFLEX

## 2017-04-17 NOTE — Assessment & Plan Note (Signed)
This is an acute, new finding for him I have advised him that this can be related to tobacco abuse, low oxygen level, and anabolic steroids He will return soon for this to be rechecked and I will screen him for abuse of anabolic steroids.

## 2017-05-02 ENCOUNTER — Telehealth (HOSPITAL_COMMUNITY): Payer: Self-pay | Admitting: Internal Medicine

## 2017-05-07 NOTE — Telephone Encounter (Signed)
User: Trina AoGRIFFIN, Alieah Brinton A Date/time: 05/06/17 3:03 PM  Comment: Called pt and lmsg for him to CB to get schedule for myoview.   Context:  Outcome: Left Message  Phone number: [phone] Phone Type: Home Phone  Comm. type: Telephone Call type: Outgoing  Contact: Aram Candelaollis, Markas Relation to patient: Self    User: Trina AoGRIFFIN, Tavaras Goody A Date/time: 05/05/17 10:46 AM  Comment: Called pt and lmsg for him to CB to get scheduled for myoview.   Context:  Outcome: Left Message  Phone number: [phone] Phone Type: Home Phone  Comm. type: Telephone Call type: Outgoing  Contact: Aram Candelaollis, Tyshun Relation to patient: Self    User: Trina AoGRIFFIN, Robb Sibal A Date/time: 05/02/17 11:27 AM  Comment: Called pt and lmsg for him to CB to sch myoview.   Context:  Outcome: Left Message  Phone number: [phone] Phone Type: Home Phone  Comm. type: Telephone Call type: Outgoing  Contact: Aram Candelaollis, Alastor Relation to patient:

## 2017-05-09 ENCOUNTER — Encounter: Payer: Self-pay | Admitting: Internal Medicine

## 2017-05-09 NOTE — Telephone Encounter (Signed)
Letter has been mailed for pt to call heart care to scheduled myoview

## 2017-05-13 ENCOUNTER — Other Ambulatory Visit (INDEPENDENT_AMBULATORY_CARE_PROVIDER_SITE_OTHER): Payer: 59

## 2017-05-13 ENCOUNTER — Encounter: Payer: Self-pay | Admitting: Internal Medicine

## 2017-05-13 ENCOUNTER — Ambulatory Visit (INDEPENDENT_AMBULATORY_CARE_PROVIDER_SITE_OTHER): Payer: 59 | Admitting: Internal Medicine

## 2017-05-13 VITALS — BP 120/80 | HR 67 | Temp 98.0°F | Resp 16 | Ht 71.0 in | Wt 208.2 lb

## 2017-05-13 DIAGNOSIS — F418 Other specified anxiety disorders: Secondary | ICD-10-CM

## 2017-05-13 DIAGNOSIS — D751 Secondary polycythemia: Secondary | ICD-10-CM

## 2017-05-13 DIAGNOSIS — E118 Type 2 diabetes mellitus with unspecified complications: Secondary | ICD-10-CM | POA: Diagnosis not present

## 2017-05-13 DIAGNOSIS — Z23 Encounter for immunization: Secondary | ICD-10-CM | POA: Diagnosis not present

## 2017-05-13 DIAGNOSIS — E781 Pure hyperglyceridemia: Secondary | ICD-10-CM

## 2017-05-13 DIAGNOSIS — I10 Essential (primary) hypertension: Secondary | ICD-10-CM

## 2017-05-13 DIAGNOSIS — M549 Dorsalgia, unspecified: Secondary | ICD-10-CM | POA: Diagnosis not present

## 2017-05-13 DIAGNOSIS — E1140 Type 2 diabetes mellitus with diabetic neuropathy, unspecified: Secondary | ICD-10-CM | POA: Diagnosis not present

## 2017-05-13 DIAGNOSIS — Z794 Long term (current) use of insulin: Secondary | ICD-10-CM

## 2017-05-13 LAB — CBC WITH DIFFERENTIAL/PLATELET
BASOS ABS: 0.1 10*3/uL (ref 0.0–0.1)
BASOS PCT: 1 % (ref 0.0–3.0)
EOS ABS: 0.3 10*3/uL (ref 0.0–0.7)
Eosinophils Relative: 3.3 % (ref 0.0–5.0)
HCT: 51.8 % (ref 39.0–52.0)
Hemoglobin: 17.5 g/dL — ABNORMAL HIGH (ref 13.0–17.0)
LYMPHS ABS: 3 10*3/uL (ref 0.7–4.0)
Lymphocytes Relative: 38.2 % (ref 12.0–46.0)
MCHC: 33.7 g/dL (ref 30.0–36.0)
MCV: 93.1 fl (ref 78.0–100.0)
Monocytes Absolute: 0.5 10*3/uL (ref 0.1–1.0)
Monocytes Relative: 7.1 % (ref 3.0–12.0)
NEUTROS ABS: 3.9 10*3/uL (ref 1.4–7.7)
Neutrophils Relative %: 50.4 % (ref 43.0–77.0)
PLATELETS: 214 10*3/uL (ref 150.0–400.0)
RBC: 5.56 Mil/uL (ref 4.22–5.81)
RDW: 13.6 % (ref 11.5–15.5)
WBC: 7.8 10*3/uL (ref 4.0–10.5)

## 2017-05-13 MED ORDER — INSULIN PEN NEEDLE 32G X 6 MM MISC: 1.0000 | Freq: Every day | 3 refills | Status: AC

## 2017-05-13 MED ORDER — EXENATIDE ER 2 MG/0.85ML ~~LOC~~ AUIJ: 1.0000 | AUTO-INJECTOR | SUBCUTANEOUS | 1 refills | Status: DC

## 2017-05-13 MED ORDER — VILAZODONE HCL 40 MG PO TABS: 40.0000 mg | ORAL_TABLET | Freq: Every day | ORAL | 1 refills | Status: DC

## 2017-05-13 MED ORDER — METFORMIN HCL 1000 MG PO TABS: 1000.0000 mg | ORAL_TABLET | Freq: Two times a day (BID) | ORAL | 1 refills | Status: DC

## 2017-05-13 MED ORDER — FREESTYLE LIBRE SENSOR SYSTEM MISC: 1.0000 | Freq: Three times a day (TID) | 1 refills | Status: DC

## 2017-05-13 MED ORDER — HYDROCODONE-ACETAMINOPHEN 10-325 MG PO TABS: 1.0000 | ORAL_TABLET | Freq: Three times a day (TID) | ORAL | 0 refills | Status: DC | PRN

## 2017-05-13 MED ORDER — FREESTYLE LIBRE READER DEVI: 1.0000 | Freq: Three times a day (TID) | 1 refills | Status: DC

## 2017-05-13 NOTE — Progress Notes (Signed)
Subjective:  Patient ID: [name], male    DOB: [birth year]  Age: 51 y.o. MRN: [id number]  CC: Depression; Diabetes; and Hyperlipidemia   HPI [name] presents for f/up- He complains of signs and symptoms of depression and anxiety and is not taking sertraline. His wife complains that he is struggling with anger, irritability, and feeling stressed. Is also concerned that his blood sugars are not well-controlled and he wants to change his diabetic regimen.  Outpatient Medications Prior to Visit  Medication Sig Dispense Refill  . allopurinol (ZYLOPRIM) 100 MG tablet Take 1 tablet (100 mg total) by mouth daily. 90 tablet 3  . aspirin 81 MG tablet Take 1 tablet (81 mg total) by mouth daily. 30 tablet 11  . canagliflozin (INVOKANA) 100 MG TABS tablet Take 1 tablet (100 mg total) by mouth daily before breakfast. 90 tablet 1  . colchicine 0.6 MG tablet Take 1 tablet (0.6 mg total) by mouth 2 (two) times daily. 180 tablet 1  . gabapentin (NEURONTIN) 100 MG capsule Take 1 capsule (100 mg total) by mouth 3 (three) times daily. 90 capsule 3  . Insulin Glargine (TOUJEO SOLOSTAR) 300 UNIT/ML SOPN Inject 30 Units into the skin daily. 1.5 mL 11  . metoprolol succinate (TOPROL-XL) 100 MG 24 hr tablet TAKE 2 TABLETS BY MOUTH DAILY WITH OR IMMEDIATELY FOLLOWING A MEAL 180 tablet 1  . telmisartan (MICARDIS) 40 MG tablet Take 1 tablet (40 mg total) by mouth daily. 90 tablet 1  . traZODone (DESYREL) 100 MG tablet Take 2 tablets (200 mg total) by mouth at bedtime as needed for sleep. 180 tablet 1  . HYDROcodone-acetaminophen (NORCO) 10-325 MG tablet Take 1 tablet by mouth every 8 (eight) hours as needed. 90 tablet 0  . sertraline (ZOLOFT) 50 MG tablet Take 1 tablet (50 mg total) by mouth daily. 90 tablet 1  . SitaGLIPtin-MetFORMIN HCl (JANUMET XR) [phone] MG TB24 Take 1 tablet by mouth daily. 90 tablet 1   No facility-administered medications prior to visit.     ROS Review of Systems    Constitutional: Positive for fatigue. Negative for appetite change, chills, diaphoresis and fever.  HENT: Negative.   Eyes: Negative for visual disturbance.  Respiratory: Negative.  Negative for cough, chest tightness, shortness of breath and wheezing.   Cardiovascular: Negative for chest pain, palpitations and leg swelling.  Gastrointestinal: Negative.  Negative for abdominal pain, constipation, diarrhea, nausea and vomiting.  Endocrine: Negative.  Negative for polydipsia, polyphagia and polyuria.  Genitourinary: Negative.  Negative for difficulty urinating.  Musculoskeletal: Positive for back pain. Negative for myalgias and neck pain.       Stinging, stabbing pain in both feet  Allergic/Immunologic: Negative.   Neurological: Negative.  Negative for dizziness and weakness.  Hematological: Negative for adenopathy. Does not bruise/bleed easily.  Psychiatric/Behavioral: Positive for dysphoric mood. Negative for behavioral problems, confusion, self-injury, sleep disturbance and suicidal ideas. The patient is nervous/anxious. The patient is not hyperactive.     Objective:  BP 120/80 (BP Location: Left Arm, Patient Position: Sitting, Cuff Size: Normal)   Pulse 67   Temp 98 F (36.7 C) (Oral)   Resp 16   Ht 5\' 11"  (1.803 m)   Wt 208 lb 4 oz (94.5 kg)   SpO2 98%   BMI 29.04 kg/m   BP Readings from Last 3 Encounters:  05/13/17 120/80  04/15/17 124/88  02/12/17 (!) 166/106    Wt Readings from Last 3 Encounters:  05/13/17 208 lb 4 oz (94.5 kg)  04/15/17 206 lb (93.4 kg)  02/12/17 210 lb (95.3 kg)    Physical Exam  Constitutional: He is oriented to person, place, and time. No distress.  HENT:  Mouth/Throat: Oropharynx is clear and moist. No oropharyngeal exudate.  Eyes: Conjunctivae are normal. Right eye exhibits no discharge. Left eye exhibits no discharge. No scleral icterus.  Neck: Normal range of motion. Neck supple. No JVD present. No thyromegaly present.  Cardiovascular:  Normal rate, regular rhythm and intact distal pulses.  Exam reveals no gallop.   No murmur heard. Pulmonary/Chest: Effort normal and breath sounds normal. No respiratory distress. He has no wheezes. He has no rales. He exhibits no tenderness.  Abdominal: Soft. Bowel sounds are normal. He exhibits no distension and no mass. There is no tenderness. There is no rebound and no guarding.  Musculoskeletal: Normal range of motion. He exhibits no edema, tenderness or deformity.  Lymphadenopathy:    He has no cervical adenopathy.  Neurological: He is alert and oriented to person, place, and time.  Skin: Skin is warm and dry. No rash noted. He is not diaphoretic. No erythema. No pallor.  Psychiatric: His behavior is normal. Judgment normal. His mood appears not anxious. His affect is not angry. His speech is not rapid and/or pressured, not delayed and not tangential. He is not aggressive, not slowed and not withdrawn. Cognition and memory are normal. He exhibits a depressed mood. He expresses no homicidal and no suicidal ideation. He expresses no suicidal plans and no homicidal plans. He is attentive.  Vitals reviewed.   Lab Results  Component Value Date   WBC 7.8 05/13/2017   HGB 17.5 (H) 05/13/2017   HCT 51.8 05/13/2017   PLT 214.0 05/13/2017   GLUCOSE 232 (H) 04/15/2017   CHOL 153 11/14/2016   TRIG 331.0 (H) 05/13/2017   HDL 34.00 (L) 11/14/2016   LDLDIRECT 56.0 11/14/2016   ALT 18 04/15/2017   AST 20 04/15/2017   NA 140 04/15/2017   K 3.4 (L) 04/15/2017   CL 99 04/15/2017   CREATININE 1.15 04/15/2017   BUN 11 04/15/2017   CO2 31 04/15/2017   TSH 1.04 03/09/2015   INR 1.3 01/13/2009   HGBA1C 7.9 05/13/2017   MICROALBUR 2.6 (H) 07/18/2016    Dg Lumbar Spine Complete  Result Date: 12/02/2014 CLINICAL DATA:  Low back pain with left-sided sciatica for 2 months EXAM: LUMBAR SPINE - COMPLETE 4+ VIEW COMPARISON:  None. FINDINGS: Frontal, lateral, spot lumbosacral lateral, bilateral oblique  views were obtained. There are 6 non-rib-bearing lumbar type vertebral bodies. There is lower lumbar levoscoliosis. There is postoperative change at L6-S1. There is no fracture or spondylolisthesis. There is moderate disc space narrowing at L6-S1. There is slightly milder narrowing at L4-5 and L5-6. 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 IMPRESSION: Postoperative change and osteoarthritic change. Mild levoscoliosis. No fracture or spondylolisthesis. Electronically Signed   By: Bretta Bang III M.D.   On: 12/02/2014 08:32    Assessment & Plan:   Mayo was seen today for depression, diabetes and hyperlipidemia.  Diagnoses and all orders for this visit:  Depression with anxiety- will start Viibryd -     Vilazodone HCl (VIIBRYD) 40 MG TABS; Take 1 tablet (40 mg total) by mouth daily.  Type 2 diabetes mellitus with complication, with long-term current use of insulin (HCC)- His A1c remains elevated at 7.9%. His blood sugars are not adequately well controlled. I will change the DPP4-i to a GLP-1 agonist. Will continue the basal insulin,  metformin, and SGLT2 inhibitor. -     Exenatide ER (BYDUREON BCISE) 2 MG/0.85ML AUIJ; Inject 1 Act into the skin once a week. -     metFORMIN (GLUCOPHAGE) 1000 MG tablet; Take 1 tablet (1,000 mg total) by mouth 2 (two) times daily with a meal. -     Ambulatory referral to Ophthalmology -     Insulin Pen Needle (NOVOFINE) 32G X 6 MM MISC; 1 Act by Does not apply route daily. qd -     Continuous Blood Gluc Receiver (FREESTYLE LIBRE READER) DEVI; 1 Act by Does not apply route 3 (three) times daily with meals. -     Continuous Blood Gluc Sensor (FREESTYLE LIBRE SENSOR SYSTEM) MISC; 1 Act by Does not apply route 3 (three) times daily with meals. -     POCT HgB A1C  Essential hypertension, benign- his blood pressure is adequately well controlled.  Erythrocytosis- his is markedly improved and most likely related to dehydration. I've asked him to  consume more liquids. -     CBC with Differential/Platelet; Future  Hypertriglyceridemia- improvement noted, he will continue to work on his lifestyle modifications. -     Triglycerides; Future  Back pain without radiation -     Discontinue: HYDROcodone-acetaminophen (NORCO) 10-325 MG tablet; Take 1 tablet by mouth every 8 (eight) hours as needed. -     HYDROcodone-acetaminophen (NORCO) 10-325 MG tablet; Take 1 tablet by mouth every 8 (eight) hours as needed.  Diabetic neuropathy, painful (HCC) -     Discontinue: HYDROcodone-acetaminophen (NORCO) 10-325 MG tablet; Take 1 tablet by mouth every 8 (eight) hours as needed. -     HYDROcodone-acetaminophen (NORCO) 10-325 MG tablet; Take 1 tablet by mouth every 8 (eight) hours as needed.  Need for prophylactic vaccination against Streptococcus pneumoniae (pneumococcus) -     Pneumococcal conjugate vaccine 13-valent   I have discontinued Mr. Cephus SlaterCollis's SitaGLIPtin-MetFORMIN HCl and sertraline. I am also having him start on Vilazodone HCl, Exenatide ER, metFORMIN, Insulin Pen Needle, FREESTYLE LIBRE READER, and FREESTYLE LIBRE SENSOR SYSTEM. Additionally, I am having him maintain his aspirin, traZODone, canagliflozin, colchicine, Insulin Glargine, metoprolol succinate, allopurinol, gabapentin, telmisartan, and HYDROcodone-acetaminophen.  Meds ordered this encounter  Medications  . Vilazodone HCl (VIIBRYD) 40 MG TABS    Sig: Take 1 tablet (40 mg total) by mouth daily.    Dispense:  90 tablet    Refill:  1  . Exenatide ER (BYDUREON BCISE) 2 MG/0.85ML AUIJ    Sig: Inject 1 Act into the skin once a week.    Dispense:  12 pen    Refill:  1  . metFORMIN (GLUCOPHAGE) 1000 MG tablet    Sig: Take 1 tablet (1,000 mg total) by mouth 2 (two) times daily with a meal.    Dispense:  180 tablet    Refill:  1  . DISCONTD: HYDROcodone-acetaminophen (NORCO) 10-325 MG tablet    Sig: Take 1 tablet by mouth every 8 (eight) hours as needed.    Dispense:  90 tablet     Refill:  0  . HYDROcodone-acetaminophen (NORCO) 10-325 MG tablet    Sig: Take 1 tablet by mouth every 8 (eight) hours as needed.    Dispense:  90 tablet    Refill:  0  . Insulin Pen Needle (NOVOFINE) 32G X 6 MM MISC    Sig: 1 Act by Does not apply route daily. qd    Dispense:  100 each    Refill:  3  . Continuous Blood Gluc  Receiver (FREESTYLE LIBRE READER) DEVI    Sig: 1 Act by Does not apply route 3 (three) times daily with meals.    Dispense:  1 Device    Refill:  1  . Continuous Blood Gluc Sensor (FREESTYLE LIBRE SENSOR SYSTEM) MISC    Sig: 1 Act by Does not apply route 3 (three) times daily with meals.    Dispense:  1 each    Refill:  1     Follow-up: Return in about 2 months (around 07/13/2017).  Sanda Linger, MD

## 2017-05-13 NOTE — Patient Instructions (Signed)
Major Depressive Disorder, Adult Major depressive disorder (MDD) is a mental health condition. It may also be called clinical depression or unipolar depression. MDD usually causes feelings of sadness, hopelessness, or helplessness. MDD can also cause physical symptoms. It can interfere with work, school, relationships, and other everyday activities. MDD may be mild, moderate, or severe. It may occur once (single episode major depressive disorder) or it may occur multiple times (recurrent major depressive disorder). What are the causes? The exact cause of this condition is not known. MDD is most likely caused by a combination of things, which may include:  Genetic factors. These are traits that are passed along from parent to child.  Individual factors. Your personality, your behavior, and the way you handle your thoughts and feelings may contribute to MDD. This includes personality traits and behaviors learned from others.  Physical factors, such as: ? Differences in the part of your brain that controls emotion. This part of your brain may be different than it is in people who do not have MDD. ? Long-term (chronic) medical or psychiatric illnesses.  Social factors. Traumatic experiences or major life changes may play a role in the development of MDD.  What increases the risk? This condition is more likely to develop in women. The following factors may also make you more likely to develop MDD:  A family history of depression.  Troubled family relationships.  Abnormally low levels of certain brain chemicals.  Traumatic events in childhood, especially abuse or the loss of a parent.  Being under a lot of stress, or long-term stress, especially from upsetting life experiences or losses.  A history of: ? Chronic physical illness. ? Other mental health disorders. ? Substance abuse.  Poor living conditions.  Experiencing social exclusion or discrimination on a regular basis.  What are  the signs or symptoms? The main symptoms of MDD typically include:  Constant depressed or irritable mood.  Loss of interest in things and activities.  MDD symptoms may also include:  Sleeping or eating too much or too little.  Unexplained weight change.  Fatigue or low energy.  Feelings of worthlessness or guilt.  Difficulty thinking clearly or making decisions.  Thoughts of suicide or of harming others.  Physical agitation or weakness.  Isolation.  Severe cases of MDD may also occur with other symptoms, such as:  Delusions or hallucinations, in which you imagine things that are not real (psychotic depression).  Low-level depression that lasts at least a year (chronic depression or persistent depressive disorder).  Extreme sadness and hopelessness (melancholic depression).  Trouble speaking and moving (catatonic depression).  How is this diagnosed? This condition may be diagnosed based on:  Your symptoms.  Your medical history, including your mental health history. This may involve tests to evaluate your mental health. You may be asked questions about your lifestyle, including any drug and alcohol use, and how long you have had symptoms of MDD.  A physical exam.  Blood tests to rule out other conditions.  You must have a depressed mood and at least four other MDD symptoms most of the day, nearly every day in the same 2-week timeframe before your health care provider can confirm a diagnosis of MDD. How is this treated? This condition is usually treated by mental health professionals, such as psychologists, psychiatrists, and clinical social workers. You may need more than one type of treatment. Treatment may include:  Psychotherapy. This is also called talk therapy or counseling. Types of psychotherapy include: ? Cognitive behavioral   therapy (CBT). This type of therapy teaches you to recognize unhealthy feelings, thoughts, and behaviors, and replace them with  positive thoughts and actions. ? Interpersonal therapy (IPT). This helps you to improve the way you relate to and communicate with others. ? Family therapy. This treatment includes members of your family.  Medicine to treat anxiety and depression, or to help you control certain emotions and behaviors.  Lifestyle changes, such as: ? Limiting alcohol and drug use. ? Exercising regularly. ? Getting plenty of sleep. ? Making healthy eating choices. ? Spending more time outdoors.  Treatments involving stimulation of the brain can be used in situations with extremely severe symptoms, or when medicine or other therapies do not work over time. These treatments include electroconvulsive therapy, transcranial magnetic stimulation, and vagal nerve stimulation. Follow these instructions at home: Activity  Return to your normal activities as told by your health care provider.  Exercise regularly and spend time outdoors as told by your health care provider. General instructions  Take over-the-counter and prescription medicines only as told by your health care provider.  Do not drink alcohol. If you drink alcohol, limit your alcohol intake to no more than 1 drink a day for nonpregnant women and 2 drinks a day for men. One drink equals 12 oz of beer, 5 oz of wine, or 1 oz of hard liquor. Alcohol can affect any antidepressant medicines you are taking. Talk to your health care provider about your alcohol use.  Eat a healthy diet and get plenty of sleep.  Find activities that you enjoy doing, and make time to do them.  Consider joining a support group. Your health care provider may be able to recommend a support group.  Keep all follow-up visits as told by your health care provider. This is important. Where to find more information: National Alliance on Mental Illness  www.nami.org  U.S. National Institute of Mental Health  www.nimh.nih.gov  National Suicide Prevention  Lifeline  1-800-273-TALK (8255). This is free, 24-hour help.  Contact a health care provider if:  Your symptoms get worse.  You develop new symptoms. Get help right away if:  You self-harm.  You have serious thoughts about hurting yourself or others.  You see, hear, taste, smell, or feel things that are not present (hallucinate). This information is not intended to replace advice given to you by your health care provider. Make sure you discuss any questions you have with your health care provider. Document Released: 01/25/2013 Document Revised: 06/06/2016 Document Reviewed: 04/10/2016 Elsevier Interactive Patient Education  2017 [employer].  

## 2017-05-14 LAB — TRIGLYCERIDES: Triglycerides: 331 mg/dL — ABNORMAL HIGH (ref 0.0–149.0)

## 2017-05-14 LAB — POCT GLYCOSYLATED HEMOGLOBIN (HGB A1C): Hemoglobin A1C: 7.9

## 2017-06-18 ENCOUNTER — Telehealth: Payer: Self-pay

## 2017-06-18 NOTE — Telephone Encounter (Signed)
PA for the colcrys 0.6 mg tablets is [id number]  PA for the colchicine 0.6 mg capsules is [id number]

## 2017-07-04 ENCOUNTER — Encounter: Payer: Self-pay | Admitting: Internal Medicine

## 2017-07-10 ENCOUNTER — Telehealth: Payer: Self-pay | Admitting: Internal Medicine

## 2017-07-10 ENCOUNTER — Other Ambulatory Visit: Payer: Self-pay | Admitting: Internal Medicine

## 2017-07-10 DIAGNOSIS — M549 Dorsalgia, unspecified: Secondary | ICD-10-CM

## 2017-07-10 DIAGNOSIS — E1140 Type 2 diabetes mellitus with diabetic neuropathy, unspecified: Secondary | ICD-10-CM

## 2017-07-10 MED ORDER — HYDROCODONE-ACETAMINOPHEN 10-325 MG PO TABS: 1.0000 | ORAL_TABLET | Freq: Three times a day (TID) | ORAL | 0 refills | Status: DC | PRN

## 2017-07-10 NOTE — Telephone Encounter (Signed)
Pt would like a refill of  HYDROcodone-acetaminophen (NORCO) 10-325 MG tablet Pt lives in [place] but will be in in [place] later today,  pleae advise and call back

## 2017-07-10 NOTE — Telephone Encounter (Signed)
Notified pt rx ready for pick-up.../lmb 

## 2017-07-10 NOTE — Telephone Encounter (Signed)
done

## 2017-07-10 NOTE — Telephone Encounter (Signed)
Check [place] registry last filled 06/11/2017...[name]

## 2017-07-17 ENCOUNTER — Ambulatory Visit: Payer: 59 | Admitting: Internal Medicine

## 2017-08-11 ENCOUNTER — Telehealth: Payer: Self-pay | Admitting: Internal Medicine

## 2017-08-11 NOTE — Telephone Encounter (Signed)
HYDROcodone-acetaminophen (NORCO) 10-325 MG tablet  Patient is requesting a refill on this medication. Please advise.

## 2017-08-11 NOTE — Telephone Encounter (Signed)
Check [place] registry last filled 07/10/2017.Marland Kitchen.Raechel Chute/lmb

## 2017-08-11 NOTE — Telephone Encounter (Signed)
Per new office policy - Opioid prescriptions are no longer being prescribed outside of an office visit. If a prescription of an opioid is being requested, please schedule an office visit.  

## 2017-08-11 NOTE — Telephone Encounter (Signed)
LVM to inform patient that he needs to set up an OV to get a refill.

## 2017-08-12 NOTE — Telephone Encounter (Signed)
Pt given MD response, appt set up for 11/5, would like to come and pick up a refill.

## 2017-08-14 ENCOUNTER — Other Ambulatory Visit: Payer: Self-pay | Admitting: Internal Medicine

## 2017-08-14 DIAGNOSIS — I10 Essential (primary) hypertension: Secondary | ICD-10-CM

## 2017-08-18 ENCOUNTER — Encounter: Payer: Self-pay | Admitting: Internal Medicine

## 2017-08-18 ENCOUNTER — Ambulatory Visit: Payer: 59 | Admitting: Internal Medicine

## 2017-08-18 VITALS — BP 138/74 | HR 85 | Temp 98.8°F | Resp 16 | Ht 71.0 in | Wt 202.0 lb

## 2017-08-18 DIAGNOSIS — Z794 Long term (current) use of insulin: Secondary | ICD-10-CM

## 2017-08-18 DIAGNOSIS — M549 Dorsalgia, unspecified: Secondary | ICD-10-CM

## 2017-08-18 DIAGNOSIS — I10 Essential (primary) hypertension: Secondary | ICD-10-CM

## 2017-08-18 DIAGNOSIS — J029 Acute pharyngitis, unspecified: Secondary | ICD-10-CM

## 2017-08-18 DIAGNOSIS — E118 Type 2 diabetes mellitus with unspecified complications: Secondary | ICD-10-CM | POA: Diagnosis not present

## 2017-08-18 DIAGNOSIS — E1140 Type 2 diabetes mellitus with diabetic neuropathy, unspecified: Secondary | ICD-10-CM | POA: Diagnosis not present

## 2017-08-18 DIAGNOSIS — B349 Viral infection, unspecified: Secondary | ICD-10-CM | POA: Diagnosis not present

## 2017-08-18 DIAGNOSIS — Z23 Encounter for immunization: Secondary | ICD-10-CM

## 2017-08-18 MED ORDER — HYDROCODONE-ACETAMINOPHEN 10-325 MG PO TABS: 1.0000 | ORAL_TABLET | Freq: Three times a day (TID) | ORAL | 0 refills | Status: DC | PRN

## 2017-08-18 NOTE — Progress Notes (Signed)
Subjective:  Patient ID: [name], male    DOB: 10/20/1965  Age: 51 y.o. MRN: [id number]  CC: Sore Throat; Diabetes; and Back Pain   HPI [name] presents for a 3 day hx of ST. He denies LAD/F/C/night sweats.  He also requests a refill on hydrocodone which he takes for chronic back pain and peripheral, painful diabetic neuropathy in both feet.  Outpatient Medications Prior to Visit  Medication Sig Dispense Refill  . allopurinol (ZYLOPRIM) 100 MG tablet Take 1 tablet (100 mg total) by mouth daily. 90 tablet 3  . aspirin 81 MG tablet Take 1 tablet (81 mg total) by mouth daily. 30 tablet 11  . canagliflozin (INVOKANA) 100 MG TABS tablet Take 1 tablet (100 mg total) by mouth daily before breakfast. 90 tablet 1  . colchicine 0.6 MG tablet Take 1 tablet (0.6 mg total) by mouth 2 (two) times daily. 180 tablet 1  . Continuous Blood Gluc Receiver (FREESTYLE LIBRE READER) DEVI 1 Act by Does not apply route 3 (three) times daily with meals. 1 Device 1  . Continuous Blood Gluc Sensor (FREESTYLE LIBRE SENSOR SYSTEM) MISC 1 Act by Does not apply route 3 (three) times daily with meals. 1 each 1  . Exenatide ER (BYDUREON BCISE) 2 MG/0.85ML AUIJ Inject 1 Act into the skin once a week. 12 pen 1  . gabapentin (NEURONTIN) 100 MG capsule Take 1 capsule (100 mg total) by mouth 3 (three) times daily. 90 capsule 3  . Insulin Glargine (TOUJEO SOLOSTAR) 300 UNIT/ML SOPN Inject 30 Units into the skin daily. 1.5 mL 11  . Insulin Pen Needle (NOVOFINE) 32G X 6 MM MISC 1 Act by Does not apply route daily. qd 100 each 3  . metFORMIN (GLUCOPHAGE) 1000 MG tablet Take 1 tablet (1,000 mg total) by mouth 2 (two) times daily with a meal. 180 tablet 1  . metoprolol succinate (TOPROL-XL) 100 MG 24 hr tablet TAKE TWO (2) TABLETS BY MOUTH DAILY WITHOR IMMEDIATELY FOLLOWING A MEAL 180 tablet 0  . telmisartan (MICARDIS) 40 MG tablet Take 1 tablet (40 mg total) by mouth daily. 90 tablet 1  . traZODone (DESYREL) 100 MG  tablet Take 2 tablets (200 mg total) by mouth at bedtime as needed for sleep. 180 tablet 1  . Vilazodone HCl (VIIBRYD) 40 MG TABS Take 1 tablet (40 mg total) by mouth daily. 90 tablet 1  . HYDROcodone-acetaminophen (NORCO) 10-325 MG tablet Take 1 tablet by mouth every 8 (eight) hours as needed. 90 tablet 0   No facility-administered medications prior to visit.     ROS Review of Systems  Constitutional: Negative.  Negative for appetite change, chills, fatigue and fever.  HENT: Positive for sore throat. Negative for congestion, sinus pressure, sinus pain, trouble swallowing and voice change.   Eyes: Negative.   Respiratory: Negative.  Negative for cough, shortness of breath and wheezing.   Cardiovascular: Negative for chest pain, palpitations and leg swelling.  Gastrointestinal: Negative.  Negative for abdominal pain, constipation, diarrhea, nausea and vomiting.  Endocrine: Negative.  Negative for polydipsia, polyphagia and polyuria.  Genitourinary: Negative.  Negative for difficulty urinating, dysuria, hematuria and urgency.  Musculoskeletal: Positive for back pain. Negative for arthralgias, myalgias and neck pain.  Skin: Negative.  Negative for color change and rash.  Allergic/Immunologic: Negative.   Neurological: Negative.  Negative for dizziness, weakness, numbness and headaches.  Hematological: Negative for adenopathy. Does not bruise/bleed easily.  Psychiatric/Behavioral: Negative.     Objective:  BP 138/74 (BP Location:  Left Arm, Patient Position: Sitting, Cuff Size: Normal)   Pulse 85   Temp 98.8 F (37.1 C) (Oral)   Resp 16   Ht 5\' 11"  (1.803 m)   Wt 202 lb (91.6 kg)   SpO2 99%   BMI 28.17 kg/m   BP Readings from Last 3 Encounters:  08/18/17 138/74  05/13/17 120/80  04/15/17 124/88    Wt Readings from Last 3 Encounters:  08/18/17 202 lb (91.6 kg)  05/13/17 208 lb 4 oz (94.5 kg)  04/15/17 206 lb (93.4 kg)    Physical Exam  Constitutional: He is oriented to  person, place, and time.  Non-toxic appearance. He does not have a sickly appearance. He does not appear ill. No distress.  HENT:  Mouth/Throat: Mucous membranes are normal. Mucous membranes are not pale, not dry and not cyanotic. No oral lesions. No trismus in the jaw. No uvula swelling. Posterior oropharyngeal erythema present. No oropharyngeal exudate, posterior oropharyngeal edema or tonsillar abscesses.  Eyes: Conjunctivae are normal. Right eye exhibits no discharge. Left eye exhibits no discharge. No scleral icterus.  Neck: Normal range of motion. Neck supple. No JVD present. No thyromegaly present.  Cardiovascular: Normal rate, regular rhythm and intact distal pulses. Exam reveals no gallop and no friction rub.  No murmur heard. Pulmonary/Chest: Effort normal and breath sounds normal. No respiratory distress. He has no wheezes. He has no rales. He exhibits no tenderness.  Abdominal: Soft. Bowel sounds are normal. He exhibits no distension and no mass. There is no tenderness. There is no rebound and no guarding.  Musculoskeletal: Normal range of motion. He exhibits no edema, tenderness or deformity.  Lymphadenopathy:    He has no cervical adenopathy.  Neurological: He is alert and oriented to person, place, and time.  Skin: Skin is warm and dry. No rash noted. He is not diaphoretic. No erythema. No pallor.  Vitals reviewed.   Lab Results  Component Value Date   WBC 7.8 05/13/2017   HGB 17.5 (H) 05/13/2017   HCT 51.8 05/13/2017   PLT 214.0 05/13/2017   GLUCOSE 232 (H) 04/15/2017   CHOL 153 11/14/2016   TRIG 331.0 (H) 05/13/2017   HDL 34.00 (L) 11/14/2016   LDLDIRECT 56.0 11/14/2016   ALT 18 04/15/2017   AST 20 04/15/2017   NA 140 04/15/2017   K 3.4 (L) 04/15/2017   CL 99 04/15/2017   CREATININE 1.15 04/15/2017   BUN 11 04/15/2017   CO2 31 04/15/2017   TSH 1.04 03/09/2015   INR 1.3 01/13/2009   HGBA1C 7.9 05/13/2017   MICROALBUR 2.6 (H) 07/18/2016    Dg Lumbar Spine  Complete  Result Date: 12/02/2014 CLINICAL DATA:  Low back pain with left-sided sciatica for 2 months EXAM: LUMBAR SPINE - COMPLETE 4+ VIEW COMPARISON:  None. FINDINGS: Frontal, lateral, spot lumbosacral lateral, bilateral oblique views were obtained. There are 6 non-rib-bearing lumbar type vertebral bodies. There is lower lumbar levoscoliosis. There is postoperative change at L6-S1. There is no fracture or spondylolisthesis. There is moderate disc space narrowing at L6-S1. There is slightly milder narrowing at L4-5 and L5-6. 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 IMPRESSION: Postoperative change and osteoarthritic change. Mild levoscoliosis. No fracture or spondylolisthesis. Electronically Signed   By: Bretta Bang III M.D.   On: 12/02/2014 08:32    Assessment & Plan:   [name] was seen today for sore throat, diabetes and back pain.  Diagnoses and all orders for this visit:  Type 2 diabetes mellitus with complication,  with long-term current use of insulin (HCC)- I will monitor his A1c and address his diabetic regimen if indicated. -     Basic metabolic panel; Future -     Microalbumin / creatinine urine ratio; Future -     Hemoglobin A1c; Future -     Ambulatory referral to Ophthalmology  Essential hypertension, benign- his blood pressure is adequately well controlled.  I will monitor his electrolytes and renal function. -     Basic metabolic panel; Future  Pharyngitis with viral syndrome - this is a viral syndrome, antibiotic therapy is not indicated.  Back pain without radiation -     Discontinue: HYDROcodone-acetaminophen (NORCO) 10-325 MG tablet; Take 1 tablet every 8 (eight) hours as needed by mouth. -     Discontinue: HYDROcodone-acetaminophen (NORCO) 10-325 MG tablet; Take 1 tablet every 8 (eight) hours as needed by mouth. -     Discontinue: HYDROcodone-acetaminophen (NORCO) 10-325 MG tablet; Take 1 tablet every 8 (eight) hours as needed by mouth. -      HYDROcodone-acetaminophen (NORCO) 10-325 MG tablet; Take 1 tablet every 8 (eight) hours as needed by mouth.  Diabetic neuropathy, painful (HCC) -     Discontinue: HYDROcodone-acetaminophen (NORCO) 10-325 MG tablet; Take 1 tablet every 8 (eight) hours as needed by mouth. -     Discontinue: HYDROcodone-acetaminophen (NORCO) 10-325 MG tablet; Take 1 tablet every 8 (eight) hours as needed by mouth. -     Discontinue: HYDROcodone-acetaminophen (NORCO) 10-325 MG tablet; Take 1 tablet every 8 (eight) hours as needed by mouth. -     HYDROcodone-acetaminophen (NORCO) 10-325 MG tablet; Take 1 tablet every 8 (eight) hours as needed by mouth.  Need for influenza vaccination -     Flu Vaccine QUAD 6+ mos PF IM (Fluarix Quad PF)  Need for pneumococcal vaccination  Other orders -     Pneumococcal polysaccharide vaccine 23-valent greater than or equal to 2yo subcutaneous/IM   I am having Pradyun Miralles maintain his aspirin, traZODone, canagliflozin, colchicine, Insulin Glargine, allopurinol, gabapentin, telmisartan, Vilazodone HCl, Exenatide ER, metFORMIN, Insulin Pen Needle, FREESTYLE LIBRE READER, FREESTYLE LIBRE SENSOR SYSTEM, metoprolol succinate, and HYDROcodone-acetaminophen.  Meds ordered this encounter  Medications  . DISCONTD: HYDROcodone-acetaminophen (NORCO) 10-325 MG tablet    Sig: Take 1 tablet every 8 (eight) hours as needed by mouth.    Dispense:  90 tablet    Refill:  0  . DISCONTD: HYDROcodone-acetaminophen (NORCO) 10-325 MG tablet    Sig: Take 1 tablet every 8 (eight) hours as needed by mouth.    Dispense:  90 tablet    Refill:  0  . DISCONTD: HYDROcodone-acetaminophen (NORCO) 10-325 MG tablet    Sig: Take 1 tablet every 8 (eight) hours as needed by mouth.    Dispense:  90 tablet    Refill:  0  . HYDROcodone-acetaminophen (NORCO) 10-325 MG tablet    Sig: Take 1 tablet every 8 (eight) hours as needed by mouth.    Dispense:  90 tablet    Refill:  0     Follow-up: Return in  about 4 months (around 12/16/2017).  Sanda Linger, MD

## 2017-08-18 NOTE — Patient Instructions (Signed)

## 2017-08-19 ENCOUNTER — Telehealth: Payer: Self-pay

## 2017-08-19 ENCOUNTER — Other Ambulatory Visit: Payer: Self-pay | Admitting: Internal Medicine

## 2017-08-19 NOTE — Telephone Encounter (Signed)
Order [id number]

## 2017-08-22 ENCOUNTER — Other Ambulatory Visit: Payer: Self-pay

## 2017-08-22 ENCOUNTER — Other Ambulatory Visit: Payer: Self-pay | Admitting: Internal Medicine

## 2017-08-22 DIAGNOSIS — I10 Essential (primary) hypertension: Secondary | ICD-10-CM

## 2017-09-29 ENCOUNTER — Encounter: Payer: Self-pay | Admitting: Internal Medicine

## 2017-11-28 NOTE — Telephone Encounter (Signed)
Order cancelled

## 2017-12-18 ENCOUNTER — Telehealth: Payer: Self-pay | Admitting: Internal Medicine

## 2017-12-18 ENCOUNTER — Other Ambulatory Visit (INDEPENDENT_AMBULATORY_CARE_PROVIDER_SITE_OTHER): Payer: Self-pay

## 2017-12-18 ENCOUNTER — Encounter: Payer: Self-pay | Admitting: Internal Medicine

## 2017-12-18 ENCOUNTER — Ambulatory Visit (INDEPENDENT_AMBULATORY_CARE_PROVIDER_SITE_OTHER): Payer: Self-pay | Admitting: Internal Medicine

## 2017-12-18 VITALS — BP 150/100 | HR 68 | Temp 98.4°F | Resp 16 | Ht 71.0 in | Wt 199.0 lb

## 2017-12-18 DIAGNOSIS — I10 Essential (primary) hypertension: Secondary | ICD-10-CM

## 2017-12-18 DIAGNOSIS — E781 Pure hyperglyceridemia: Secondary | ICD-10-CM

## 2017-12-18 DIAGNOSIS — E785 Hyperlipidemia, unspecified: Secondary | ICD-10-CM

## 2017-12-18 DIAGNOSIS — E118 Type 2 diabetes mellitus with unspecified complications: Secondary | ICD-10-CM

## 2017-12-18 DIAGNOSIS — Z794 Long term (current) use of insulin: Secondary | ICD-10-CM

## 2017-12-18 DIAGNOSIS — M549 Dorsalgia, unspecified: Secondary | ICD-10-CM

## 2017-12-18 DIAGNOSIS — Z1211 Encounter for screening for malignant neoplasm of colon: Secondary | ICD-10-CM

## 2017-12-18 DIAGNOSIS — L989 Disorder of the skin and subcutaneous tissue, unspecified: Secondary | ICD-10-CM

## 2017-12-18 DIAGNOSIS — E1140 Type 2 diabetes mellitus with diabetic neuropathy, unspecified: Secondary | ICD-10-CM

## 2017-12-18 LAB — POCT GLYCOSYLATED HEMOGLOBIN (HGB A1C): HEMOGLOBIN A1C: 12.6

## 2017-12-18 LAB — LDL CHOLESTEROL, DIRECT: Direct LDL: 68 mg/dL

## 2017-12-18 LAB — CBC WITH DIFFERENTIAL/PLATELET
BASOS ABS: 0 10*3/uL (ref 0.0–0.1)
BASOS PCT: 0.7 % (ref 0.0–3.0)
Eosinophils Absolute: 0.2 10*3/uL (ref 0.0–0.7)
Eosinophils Relative: 3.3 % (ref 0.0–5.0)
HCT: 48.7 % (ref 39.0–52.0)
HEMOGLOBIN: 17.1 g/dL — AB (ref 13.0–17.0)
LYMPHS PCT: 31.3 % (ref 12.0–46.0)
Lymphs Abs: 2.1 10*3/uL (ref 0.7–4.0)
MCHC: 35.1 g/dL (ref 30.0–36.0)
MCV: 91 fl (ref 78.0–100.0)
MONO ABS: 0.4 10*3/uL (ref 0.1–1.0)
Monocytes Relative: 6.2 % (ref 3.0–12.0)
NEUTROS ABS: 3.9 10*3/uL (ref 1.4–7.7)
Neutrophils Relative %: 58.5 % (ref 43.0–77.0)
PLATELETS: 202 10*3/uL (ref 150.0–400.0)
RBC: 5.35 Mil/uL (ref 4.22–5.81)
RDW: 12.2 % (ref 11.5–15.5)
WBC: 6.7 10*3/uL (ref 4.0–10.5)

## 2017-12-18 LAB — URINALYSIS, ROUTINE W REFLEX MICROSCOPIC
Bilirubin Urine: NEGATIVE
Ketones, ur: NEGATIVE
Leukocytes, UA: NEGATIVE
Nitrite: NEGATIVE
PH: 6 (ref 5.0–8.0)
SPECIFIC GRAVITY, URINE: 1.015 (ref 1.000–1.030)
TOTAL PROTEIN, URINE-UPE24: NEGATIVE
Urobilinogen, UA: 0.2 (ref 0.0–1.0)

## 2017-12-18 LAB — POCT GLUCOSE (DEVICE FOR HOME USE): POC GLUCOSE: 237 mg/dL — AB (ref 70–99)

## 2017-12-18 LAB — LIPID PANEL
Cholesterol: 217 mg/dL — ABNORMAL HIGH (ref 0–200)
HDL: 32.4 mg/dL — ABNORMAL LOW (ref >39.00)
Total CHOL/HDL Ratio: 7
Triglycerides: 937 mg/dL — ABNORMAL HIGH (ref 0.0–149.0)

## 2017-12-18 LAB — BASIC METABOLIC PANEL
BUN: 10 mg/dL (ref 6–23)
CALCIUM: 9.9 mg/dL (ref 8.4–10.5)
CHLORIDE: 97 meq/L (ref 96–112)
CO2: 31 meq/L (ref 19–32)
CREATININE: 0.86 mg/dL (ref 0.40–1.50)
GFR: 99.4 mL/min (ref >60.00)
GLUCOSE: 304 mg/dL — AB (ref 70–99)
POTASSIUM: 4.4 meq/L (ref 3.5–5.1)
SODIUM: 135 meq/L (ref 135–145)

## 2017-12-18 LAB — HEMOGLOBIN A1C: Hgb A1c MFr Bld: 12.1 % — ABNORMAL HIGH (ref 4.6–6.5)

## 2017-12-18 MED ORDER — OMEGA-3-ACID ETHYL ESTERS 1 G PO CAPS: 2.0000 g | ORAL_CAPSULE | Freq: Two times a day (BID) | ORAL | 1 refills | Status: AC

## 2017-12-18 MED ORDER — METFORMIN HCL 1000 MG PO TABS: 1000.0000 mg | ORAL_TABLET | Freq: Two times a day (BID) | ORAL | 1 refills | Status: AC

## 2017-12-18 MED ORDER — METOPROLOL SUCCINATE ER 100 MG PO TB24: ORAL_TABLET | ORAL | 0 refills | Status: AC

## 2017-12-18 MED ORDER — CANAGLIFLOZIN 300 MG PO TABS: 300.0000 mg | ORAL_TABLET | Freq: Every day | ORAL | 0 refills | Status: AC

## 2017-12-18 MED ORDER — HYDROCODONE-ACETAMINOPHEN 10-325 MG PO TABS: 1.0000 | ORAL_TABLET | Freq: Three times a day (TID) | ORAL | 0 refills | Status: DC | PRN

## 2017-12-18 MED ORDER — TELMISARTAN 40 MG PO TABS: 40.0000 mg | ORAL_TABLET | Freq: Every day | ORAL | 1 refills | Status: AC

## 2017-12-18 MED ORDER — INSULIN GLARGINE 300 UNIT/ML ~~LOC~~ SOPN: 50.0000 [IU] | PEN_INJECTOR | Freq: Every day | SUBCUTANEOUS | 1 refills | Status: AC

## 2017-12-18 MED ORDER — EXENATIDE ER 2 MG/0.85ML ~~LOC~~ AUIJ: 1.0000 | AUTO-INJECTOR | SUBCUTANEOUS | 1 refills | Status: AC

## 2017-12-18 NOTE — Telephone Encounter (Signed)
Pt will p/u his script at [facility] today, but he is requesting future scripts be sent to [facility], .

## 2017-12-18 NOTE — Progress Notes (Signed)
Subjective:  Patient ID: [name], male    DOB: 1966/03/20  Age: 52 y.o. MRN: [id number]  CC: Hypertension; Diabetes; and Hyperlipidemia   HPI [name] presents for f/up -  Since I last saw him he has lost his job, has lost his insurance, and has not been able to afford his medications.  He complains of a brownish lesion on the top of his left great toe that feels dry but otherwise does not bother him.  He has not treated it with a topical application.  He complains that his blood sugars are always above 200 and he has had some episodes of dry mouth.  He tells me his blood pressure has not been well controlled but he denies headache, chest pain, shortness of breath, palpitations, edema, or fatigue.  Outpatient Medications Prior to Visit  Medication Sig Dispense Refill  . allopurinol (ZYLOPRIM) 100 MG tablet Take 1 tablet (100 mg total) by mouth daily. 90 tablet 3  . aspirin 81 MG tablet Take 1 tablet (81 mg total) by mouth daily. 30 tablet 11  . colchicine 0.6 MG tablet Take 1 tablet (0.6 mg total) by mouth 2 (two) times daily. 180 tablet 1  . gabapentin (NEURONTIN) 100 MG capsule Take 1 capsule (100 mg total) by mouth 3 (three) times daily. 90 capsule 3  . Insulin Pen Needle (NOVOFINE) 32G X 6 MM MISC 1 Act by Does not apply route daily. qd 100 each 3  . canagliflozin (INVOKANA) 100 MG TABS tablet Take 1 tablet (100 mg total) by mouth daily before breakfast. 90 tablet 1  . Continuous Blood Gluc Receiver (FREESTYLE LIBRE READER) DEVI 1 Act by Does not apply route 3 (three) times daily with meals. 1 Device 1  . Continuous Blood Gluc Sensor (FREESTYLE LIBRE SENSOR SYSTEM) MISC 1 Act by Does not apply route 3 (three) times daily with meals. 1 each 1  . Exenatide ER (BYDUREON BCISE) 2 MG/0.85ML AUIJ Inject 1 Act into the skin once a week. 12 pen 1  . HYDROcodone-acetaminophen (NORCO) 10-325 MG tablet Take 1 tablet every 8 (eight) hours as needed by mouth. 90 tablet 0  . Insulin  Glargine (TOUJEO SOLOSTAR) 300 UNIT/ML SOPN Inject 30 Units into the skin daily. 1.5 mL 11  . metFORMIN (GLUCOPHAGE) 1000 MG tablet Take 1 tablet (1,000 mg total) by mouth 2 (two) times daily with a meal. 180 tablet 1  . metoprolol succinate (TOPROL-XL) 100 MG 24 hr tablet TAKE TWO (2) TABLETS BY MOUTH DAILY WITHOR IMMEDIATELY FOLLOWING A MEAL 180 tablet 0  . telmisartan (MICARDIS) 40 MG tablet Take 1 tablet (40 mg total) by mouth daily. 90 tablet 1  . traZODone (DESYREL) 100 MG tablet Take 2 tablets (200 mg total) by mouth at bedtime as needed for sleep. 180 tablet 1  . Vilazodone HCl (VIIBRYD) 40 MG TABS Take 1 tablet (40 mg total) by mouth daily. 90 tablet 1   No facility-administered medications prior to visit.     ROS Review of Systems  Constitutional: Negative.  Negative for diaphoresis, fatigue, fever and unexpected weight change.  HENT: Negative.   Eyes: Negative for visual disturbance.  Respiratory: Negative for cough, chest tightness, shortness of breath and wheezing.   Gastrointestinal: Negative for abdominal pain, constipation, diarrhea, nausea and vomiting.  Endocrine: Positive for polydipsia. Negative for polyphagia and polyuria.  Genitourinary: Negative.  Negative for difficulty urinating, dysuria, frequency, genital sores and hematuria.  Musculoskeletal: Positive for back pain. Negative for arthralgias and myalgias.  Has a stabbing pain in both feet.  The symptoms are controlled with the occasional dose of hydrocodone.  Skin: Positive for rash.  Allergic/Immunologic: Negative.   Neurological: Negative.  Negative for dizziness, weakness, light-headedness and headaches.  Hematological: Negative for adenopathy. Does not bruise/bleed easily.  Psychiatric/Behavioral: Negative.     Objective:  BP (!) 150/100 (BP Location: Left Arm, Patient Position: Sitting, Cuff Size: Large)   Pulse 68   Temp 98.4 F (36.9 C) (Oral)   Resp 16   Ht 5\' 11"  (1.803 m)   Wt 199 lb (90.3  kg)   SpO2 98%   BMI 27.75 kg/m   BP Readings from Last 3 Encounters:  12/18/17 (!) 150/100  08/18/17 138/74  05/13/17 120/80    Wt Readings from Last 3 Encounters:  12/18/17 199 lb (90.3 kg)  08/18/17 202 lb (91.6 kg)  05/13/17 208 lb 4 oz (94.5 kg)    Physical Exam  Constitutional: He is oriented to person, place, and time.  Non-toxic appearance. He does not have a sickly appearance. He does not appear ill. No distress.  HENT:  Mouth/Throat: Oropharynx is clear and moist. No oropharyngeal exudate.  Eyes: Conjunctivae are normal. Left eye exhibits no discharge. No scleral icterus.  Neck: Normal range of motion. Neck supple. No JVD present. No thyromegaly present.  Cardiovascular: Normal rate, regular rhythm and normal heart sounds. Exam reveals no gallop.  No murmur heard. Pulmonary/Chest: Effort normal and breath sounds normal. No respiratory distress. He has no wheezes. He has no rales.  Abdominal: Soft. Bowel sounds are normal. He exhibits no distension and no mass. There is no tenderness. There is no guarding.  Musculoskeletal: Normal range of motion. He exhibits no edema, tenderness or deformity.  Lymphadenopathy:    He has no cervical adenopathy.  Neurological: He is alert and oriented to person, place, and time.  Skin: Skin is warm and dry. Rash noted. He is not diaphoretic. No erythema. No pallor.  On the dorsum of the left great toe there is a brown macule with mild xerosis.  There are various degrees of pigmentation.  The area measures 1.5 cm and is round.  Psychiatric: He has a normal mood and affect. His behavior is normal. Judgment and thought content normal.    Lab Results  Component Value Date   WBC 6.7 12/18/2017   HGB 17.1 (H) 12/18/2017   HCT 48.7 12/18/2017   PLT 202.0 12/18/2017   GLUCOSE 304 (H) 12/18/2017   CHOL 217 (H) 12/18/2017   TRIG (H) 12/18/2017    937.0 Triglyceride is over 400; calculations on Lipids are invalid.   HDL 32.40 (L) 12/18/2017    LDLDIRECT 68.0 12/18/2017   ALT 18 04/15/2017   AST 20 04/15/2017   NA 135 12/18/2017   K 4.4 12/18/2017   CL 97 12/18/2017   CREATININE 0.86 12/18/2017   BUN 10 12/18/2017   CO2 31 12/18/2017   TSH 1.04 03/09/2015   INR 1.3 01/13/2009   HGBA1C 12.1 (H) 12/18/2017   MICROALBUR 2.6 (H) 07/18/2016    Dg Lumbar Spine Complete  Result Date: 12/02/2014 CLINICAL DATA:  Low back pain with left-sided sciatica for 2 months EXAM: LUMBAR SPINE - COMPLETE 4+ VIEW COMPARISON:  None. FINDINGS: Frontal, lateral, spot lumbosacral lateral, bilateral oblique views were obtained. There are 6 non-rib-bearing lumbar type vertebral bodies. There is lower lumbar levoscoliosis. There is postoperative change at L6-S1. There is no fracture or spondylolisthesis. There is moderate disc space narrowing at L6-S1. There  is slightly milder narrowing at L4-5 and L5-6. 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 IMPRESSION: Postoperative change and osteoarthritic change. Mild levoscoliosis. No fracture or spondylolisthesis. Electronically Signed   By: Bretta BangWilliam  Woodruff III M.D.   On: 12/02/2014 08:32    Assessment & Plan:   [name] was seen today for hypertension, diabetes and hyperlipidemia.  Diagnoses and all orders for this visit:  Essential hypertension, benign- His blood pressure is not adequately well controlled.  Will restart the usual meds. -     metoprolol succinate (TOPROL-XL) 100 MG 24 hr tablet; TAKE TWO (2) TABLETS BY MOUTH DAILY WITHOR IMMEDIATELY FOLLOWING A MEAL -     telmisartan (MICARDIS) 40 MG tablet; Take 1 tablet (40 mg total) by mouth daily. -     Basic metabolic panel; Future -     CBC with Differential/Platelet; Future -     Urinalysis, Routine w reflex microscopic; Future  Type 2 diabetes mellitus with complication, without long-term current use of insulin (HCC)- His A1c is up to 12.1%.  I gave him samples of Invokana and a basal insulin.  I have asked him to restart  metformin and the GLP-1 agonist. -     telmisartan (MICARDIS) 40 MG tablet; Take 1 tablet (40 mg total) by mouth daily. -     Basic metabolic panel; Future -     POCT glycosylated hemoglobin (Hb A1C) -     POCT Glucose (Device for Home Use) -     Insulin Glargine (TOUJEO SOLOSTAR) 300 UNIT/ML SOPN; Inject 50 Units into the skin daily. -     [facility] -     Amb Referral to Nutrition and Diabetic E -     Ambulatory referral to Endocrinology -     Exenatide ER (BYDUREON BCISE) 2 MG/0.85ML AUIJ; Inject 1 Act into the skin once a week. -     metFORMIN (GLUCOPHAGE) 1000 MG tablet; Take 1 tablet (1,000 mg total) by mouth 2 (two) times daily with a meal. -     canagliflozin (INVOKANA) 300 MG TABS tablet; Take 1 tablet (300 mg total) by mouth daily before breakfast.  Hyperlipidemia with target LDL less than 100- He has achieved his LDL goal and is doing well on the statin. -     Lipid panel; Future  Hypertriglyceridemia-glycerides are nearly 1000.  He was asked to improve his lifestyle modifications.  Will restart the omega-3 fish oil and will start a basal insulin to lower his triglycerides. -     Lipid panel; Future -     omega-3 acid ethyl esters (LOVAZA) 1 g capsule; Take 2 capsules (2 g total) by mouth 2 (two) times daily.  Colon cancer screening -     Ambulatory referral to Gastroenterology  Skin lesion of left lower limb -     Ambulatory referral to Dermatology  Type 2 diabetes mellitus with complication, with long-term current use of insulin (HCC)  Back pain without radiation -     HYDROcodone-acetaminophen (NORCO) 10-325 MG tablet; Take 1 tablet by mouth every 8 (eight) hours as needed.  Diabetic neuropathy, painful (HCC) -     HYDROcodone-acetaminophen (NORCO) 10-325 MG tablet; Take 1 tablet by mouth every 8 (eight) hours as needed.   I have discontinued Jeremia Pickford's traZODone, canagliflozin, Insulin Glargine, Vilazodone HCl, FREESTYLE LIBRE READER, and  FREESTYLE LIBRE SENSOR SYSTEM. I have also changed his HYDROcodone-acetaminophen. Additionally, I am having him start on Insulin Glargine, canagliflozin, and omega-3 acid ethyl  esters. Lastly, I am having him maintain his aspirin, colchicine, allopurinol, gabapentin, Insulin Pen Needle, metoprolol succinate, telmisartan, Exenatide ER, and metFORMIN.  Meds ordered this encounter  Medications  . metoprolol succinate (TOPROL-XL) 100 MG 24 hr tablet    Sig: TAKE TWO (2) TABLETS BY MOUTH DAILY WITHOR IMMEDIATELY FOLLOWING A MEAL    Dispense:  180 tablet    Refill:  0  . telmisartan (MICARDIS) 40 MG tablet    Sig: Take 1 tablet (40 mg total) by mouth daily.    Dispense:  90 tablet    Refill:  1  . Insulin Glargine (TOUJEO SOLOSTAR) 300 UNIT/ML SOPN    Sig: Inject 50 Units into the skin daily.    Dispense:  9 mL    Refill:  1  . Exenatide ER (BYDUREON BCISE) 2 MG/0.85ML AUIJ    Sig: Inject 1 Act into the skin once a week.    Dispense:  12 pen    Refill:  1  . metFORMIN (GLUCOPHAGE) 1000 MG tablet    Sig: Take 1 tablet (1,000 mg total) by mouth 2 (two) times daily with a meal.    Dispense:  180 tablet    Refill:  1  . canagliflozin (INVOKANA) 300 MG TABS tablet    Sig: Take 1 tablet (300 mg total) by mouth daily before breakfast.    Dispense:  84 tablet    Refill:  0  . HYDROcodone-acetaminophen (NORCO) 10-325 MG tablet    Sig: Take 1 tablet by mouth every 8 (eight) hours as needed.    Dispense:  90 tablet    Refill:  0  . omega-3 acid ethyl esters (LOVAZA) 1 g capsule    Sig: Take 2 capsules (2 g total) by mouth 2 (two) times daily.    Dispense:  360 capsule    Refill:  1     Follow-up: Return in about 4 weeks (around 01/15/2018).  Sanda Linger, MD

## 2017-12-18 NOTE — Patient Instructions (Signed)

## 2018-01-26 ENCOUNTER — Telehealth: Payer: Self-pay | Admitting: Internal Medicine

## 2018-01-26 ENCOUNTER — Ambulatory Visit: Payer: Self-pay | Admitting: Nutrition

## 2018-01-26 NOTE — Telephone Encounter (Signed)
Called [name] with [name] - Pt did not show for appt today. Pt is rescheduled for 02/25/2018.   [name] also stated "Bydureon is contraindicated in pt with acute pancreatitis and [name]".   [name] stated that pt lost his job again and does not have insurance and does not have any toujeo.

## 2018-01-26 NOTE — Telephone Encounter (Signed)
Copied from CRM [id number]. Topic: Quick Communication - See Telephone Encounter >> Jan 26, 2018  8:50 AM Jolayne Hainesaylor, Brittany L wrote: CRM for notification. See Telephone encounter for: 01/26/18.   Boyd Kerbsenny from [facility] (dietician) would like the nurse to call her back, she will be seeing him today @ 11am. Call back is [phone]

## 2018-01-27 ENCOUNTER — Other Ambulatory Visit: Payer: Self-pay | Admitting: Internal Medicine

## 2018-01-27 ENCOUNTER — Encounter: Payer: Self-pay | Admitting: Internal Medicine

## 2018-01-27 NOTE — Telephone Encounter (Signed)
Left detailed message for pt to stop the bydureon and also to let us know if we need to find patient assistance resources for him to receive his insulins at a reduced cost.

## 2018-01-27 NOTE — Telephone Encounter (Signed)
Okay, so I guess he will have to stop using Bydureon.  I do not have a solution for his inability to pay for medications unless he signs up for a patient assistance program

## 2018-02-10 ENCOUNTER — Telehealth: Payer: Self-pay | Admitting: Nutrition

## 2018-02-10 NOTE — Telephone Encounter (Signed)
VM left to have pt return call to see if he needs help with getting medication assistance for his insulin.

## 2018-02-17 ENCOUNTER — Other Ambulatory Visit: Payer: Self-pay | Admitting: Internal Medicine

## 2018-02-17 DIAGNOSIS — E1140 Type 2 diabetes mellitus with diabetic neuropathy, unspecified: Secondary | ICD-10-CM

## 2018-02-17 DIAGNOSIS — M549 Dorsalgia, unspecified: Secondary | ICD-10-CM

## 2018-02-17 MED ORDER — HYDROCODONE-ACETAMINOPHEN 10-325 MG PO TABS: 1.0000 | ORAL_TABLET | Freq: Three times a day (TID) | ORAL | 0 refills | Status: AC | PRN

## 2018-02-17 NOTE — Telephone Encounter (Signed)
Refill of Hydrocodone  LOV 12/18/17  Dr. Yetta Barre  LRF 12/18/17  #90  0 refills  [facility] - [place], [place] - [address]  Phone: [phone] Fax: [phone]  Has f/u appt 03/11/18

## 2018-02-17 NOTE — Telephone Encounter (Signed)
Pt. has new job and will not have  insurance coverage for 3 months. Requesting samples of:    Insulin Glargine (TOUJEO SOLOSTAR) 300 UNIT/ML SOPN Insulin Pen Needle (NOVOFINE) 32G X 6 MM MISC

## 2018-02-17 NOTE — Telephone Encounter (Signed)
Copied from CRM [id number]. Topic: Quick Communication - Rx Refill/Question >> Feb 17, 2018  8:22 AM Cipriano Bunker wrote: Medication:  Please call pt.   HYDROcodone-acetaminophen (NORCO) 10-325 MG tablet 90 tablet 0 12/18/2017   Sig - Route: Take 1 tablet by mouth every 8 (eight) hours as needed. - Oral  Sent to pharmacy as: HYDROcodone-acetaminophen (NORCO) 10-325 MG tablet  Pt. Just started new job and could not get time off until 5/29 at 4:00 and is out of medication.  Asking if can get filled asap. His insurance does not start for 3 months.  Also needs samples of :     Insulin Glargine (TOUJEO SOLOSTAR) 300 UNIT/ML SOPN Insulin Pen Needle (NOVOFINE) 32G X 6 MM MISC   Has the patient contacted their pharmacy? No. (Agent: If no, request that the patient contact the pharmacy for the refill.) Preferred Pharmacy (with phone number or street name):   [facility] - [place], [place] - [address] [place] [address] Phone: [phone] Fax: [phone]   Agent: Please be advised that RX refills may take up to 3 business days. We ask that you follow-up with your pharmacy.

## 2018-02-18 NOTE — Telephone Encounter (Signed)
MD ok refill for pain med.[name] is there any samples of toujeo?..[name]

## 2018-02-25 ENCOUNTER — Ambulatory Visit: Payer: Self-pay | Admitting: Nutrition

## 2018-03-11 ENCOUNTER — Encounter

## 2018-03-11 ENCOUNTER — Ambulatory Visit: Payer: Self-pay | Admitting: Internal Medicine

## 2018-03-11 DIAGNOSIS — Z0289 Encounter for other administrative examinations: Secondary | ICD-10-CM

## 2018-04-23 ENCOUNTER — Ambulatory Visit: Payer: Self-pay | Admitting: Nutrition

## 2018-05-26 ENCOUNTER — Ambulatory Visit: Payer: Self-pay | Admitting: Internal Medicine

## 2018-06-17 ENCOUNTER — Ambulatory Visit: Payer: Self-pay | Admitting: Nutrition

## 2019-12-14 ENCOUNTER — Ambulatory Visit: Payer: Self-pay

## 2020-01-22 ENCOUNTER — Ambulatory Visit: Payer: Self-pay | Attending: Internal Medicine

## 2020-01-22 DIAGNOSIS — Z23 Encounter for immunization: Secondary | ICD-10-CM

## 2020-01-22 NOTE — Progress Notes (Signed)
   Covid-19 Vaccination Clinic  Name:  Kali Ambler    MRN: [id number] DOB: 04-22-1966  01/22/2020  Mr. Ovitt was observed post Covid-19 immunization for 15 minutes without incident. He was provided with Vaccine Information Sheet and instruction to access the V-Safe system.   Mr. Treat was instructed to call 911 with any severe reactions post vaccine: Marland Kitchen Difficulty breathing  . Swelling of face and throat  . A fast heartbeat  . A bad rash all over body  . Dizziness and weakness   Immunizations Administered    Name Date Dose VIS Date Route   Moderna COVID-19 Vaccine 01/22/2020 11:32 AM 0.5 mL 09/14/2019 Intramuscular   Manufacturer: Moderna   Lot: [id number]   NDC: 98921-194-17

## 2020-02-19 ENCOUNTER — Ambulatory Visit: Payer: Self-pay | Attending: Internal Medicine

## 2020-02-19 DIAGNOSIS — Z23 Encounter for immunization: Secondary | ICD-10-CM

## 2020-02-19 NOTE — Progress Notes (Signed)
   Covid-19 Vaccination Clinic  Name:  [name]    MRN: [id number] DOB: 25-Feb-1966  02/19/2020  [name] was observed post Covid-19 immunization for 15 minutes without incident. He was provided with Vaccine Information Sheet and instruction to access the V-Safe system.   [name] was instructed to call 911 with any severe reactions post vaccine: Marland Kitchen Difficulty breathing  . Swelling of face and throat  . A fast heartbeat  . A bad rash all over body  . Dizziness and weakness   Immunizations Administered    Name Date Dose VIS Date Route   Moderna COVID-19 Vaccine 02/19/2020 10:25 AM 0.5 mL 09/2019 Intramuscular   Manufacturer: Moderna   Lot: [id number]   NDC: 88337-445-14

## 2021-12-04 ENCOUNTER — Emergency Department (HOSPITAL_COMMUNITY)
Admission: EM | Admit: 2021-12-04 | Discharge: 2021-12-04 | Disposition: A | Discharge status: Home / Self Care | Payer: 59 | Attending: Emergency Medicine | Admitting: Emergency Medicine

## 2021-12-04 ENCOUNTER — Other Ambulatory Visit: Payer: Self-pay

## 2021-12-04 ENCOUNTER — Encounter (HOSPITAL_COMMUNITY): Payer: Self-pay | Admitting: *Deleted

## 2021-12-04 DIAGNOSIS — Z23 Encounter for immunization: Secondary | ICD-10-CM | POA: Insufficient documentation

## 2021-12-04 DIAGNOSIS — Z794 Long term (current) use of insulin: Secondary | ICD-10-CM | POA: Diagnosis not present

## 2021-12-04 DIAGNOSIS — S41111A Laceration without foreign body of right upper arm, initial encounter: Secondary | ICD-10-CM

## 2021-12-04 DIAGNOSIS — W260XXA Contact with knife, initial encounter: Secondary | ICD-10-CM | POA: Insufficient documentation

## 2021-12-04 DIAGNOSIS — S56921A Laceration of unspecified muscles, fascia and tendons at forearm level, right arm, initial encounter: Secondary | ICD-10-CM | POA: Insufficient documentation

## 2021-12-04 DIAGNOSIS — S59911A Unspecified injury of right forearm, initial encounter: Secondary | ICD-10-CM | POA: Diagnosis present

## 2021-12-04 DIAGNOSIS — Z7982 Long term (current) use of aspirin: Secondary | ICD-10-CM | POA: Diagnosis not present

## 2021-12-04 MED ORDER — TETANUS-DIPHTH-ACELL PERTUSSIS 5-2.5-18.5 LF-MCG/0.5 IM SUSY
0.5000 mL | PREFILLED_SYRINGE | Freq: Once | INTRAMUSCULAR | Status: AC
  Administered 2021-12-04: 0.5 mL via INTRAMUSCULAR
  Filled 2021-12-04: qty 0.5

## 2021-12-04 MED ORDER — OXYCODONE-ACETAMINOPHEN 5-325 MG PO TABS
1.0000 | ORAL_TABLET | Freq: Once | ORAL | Status: AC
  Administered 2021-12-04: 1 via ORAL
  Filled 2021-12-04: qty 1

## 2021-12-04 MED ORDER — LIDOCAINE-EPINEPHRINE (PF) 2 %-1:200000 IJ SOLN
20.0000 mL | Freq: Once | INTRAMUSCULAR | Status: AC
  Administered 2021-12-04: 20 mL
  Filled 2021-12-04: qty 20

## 2021-12-04 NOTE — ED Provider Notes (Signed)
[facility] EMERGENCY DEPARTMENT Provider Note   CSN: [id number] Arrival date & time: 12/04/21  1802     History  Chief Complaint  Patient presents with   Laceration    [name] is a 56 y.o. male who presents to the ED today after sustaining a significant laceration to his right forearm that occurred about 1 hour prior to arrival.  Patient reports that he was reaching into the dishwasher and instead of pulling the drawer out he put his arm inside without looking.  He states that a large knife was sticking upright and sliced him on his forearm.  Bleeding currently controlled with pressure dressing.  Patient is not up-to-date on tetanus.  He complains of pain to the area. No other complaints at this time.   The history is provided by the patient and medical records.      Home Medications Prior to Admission medications   Medication Sig Start Date End Date Taking? Authorizing Provider  allopurinol (ZYLOPRIM) 100 MG tablet Take 1 tablet (100 mg total) by mouth daily. 02/12/17  Yes Etta Grandchild, MD  ALPRAZolam Prudy Feeler) 0.25 MG tablet Take 0.25 mg by mouth 2 (two) times daily as needed. 11/09/21  Yes [provider]  amphetamine-dextroamphetamine (ADDERALL) 20 MG tablet Take 20 mg by mouth daily. 11/09/21  Yes [provider]  amphetamine-dextroamphetamine (ADDERALL) 30 MG tablet Take 1 tablet by mouth daily. 11/09/21  Yes [provider]  colchicine 0.6 MG tablet Take 1 tablet (0.6 mg total) by mouth 2 (two) times daily. 11/14/16 12/04/21 Yes Etta Grandchild, MD  gabapentin (NEURONTIN) 600 MG tablet Take 600 mg by mouth at bedtime. 11/09/21  Yes [provider]  glipiZIDE (GLUCOTROL XL) 10 MG 24 hr tablet Take 20 mg by mouth daily. 11/01/21  Yes [provider]  metFORMIN (GLUCOPHAGE) 1000 MG tablet Take 1 tablet (1,000 mg total) by mouth 2 (two) times daily with a meal. 12/18/17  Yes Etta Grandchild, MD  metoprolol succinate (TOPROL-XL) 100 MG 24 hr  tablet TAKE TWO (2) TABLETS BY MOUTH DAILY WITHOR IMMEDIATELY FOLLOWING A MEAL Patient taking differently: Take 100 mg by mouth daily. 12/18/17  Yes Etta Grandchild, MD  naloxone [facility]) nasal spray 4 mg/0.1 mL SMARTSIG:In Nostril 07/11/21  Yes [provider]  omeprazole (PRILOSEC) 40 MG capsule Take 40 mg by mouth daily. 11/09/21  Yes [provider]  oxyCODONE-acetaminophen (PERCOCET) 10-325 MG tablet Take 1 tablet by mouth 4 (four) times daily as needed. 11/09/21  Yes [provider]  pravastatin (PRAVACHOL) 40 MG tablet Take 40 mg by mouth daily. 06/25/21  Yes [provider]  sertraline (ZOLOFT) 100 MG tablet Take 100 mg by mouth at bedtime. 11/09/21  Yes [provider]  sildenafil (VIAGRA) 25 MG tablet Take 25 mg by mouth as directed. 11/09/21  Yes [provider]  tiZANidine (ZANAFLEX) 4 MG tablet Take by mouth 2 (two) times daily as needed. 11/09/21  Yes [provider]  traZODone (DESYREL) 150 MG tablet Take 150-300 mg by mouth daily. 10/11/21  Yes [provider]  Vitamin D, Ergocalciferol, (DRISDOL) 1.25 MG (50000 UNIT) CAPS capsule Take 50,000 Units by mouth once a week. 11/09/21  Yes [provider]  aspirin 81 MG tablet Take 1 tablet (81 mg total) by mouth daily. Patient not taking: Reported on 12/04/2021 03/09/15   Etta Grandchild, MD  canagliflozin (INVOKANA) 300 MG TABS tablet Take 1 tablet (300 mg total) by mouth daily before breakfast.  Patient not taking: Reported on 12/04/2021 12/18/17   Etta Grandchild, MD  Exenatide ER (BYDUREON BCISE) 2 MG/0.85ML AUIJ Inject 1 Act into the skin once a week. Patient not taking: Reported on 12/04/2021 12/18/17   Etta Grandchild, MD  gabapentin (NEURONTIN) 100 MG capsule Take 1 capsule (100 mg total) by mouth 3 (three) times daily. Patient not taking: Reported on 12/04/2021 02/12/17   Etta Grandchild, MD  HYDROcodone-acetaminophen [place]) 10-325 MG tablet Take 1 tablet by mouth  every 8 (eight) hours as needed. Patient not taking: Reported on 12/04/2021 02/17/18   Etta Grandchild, MD  Insulin Glargine (TOUJEO SOLOSTAR) 300 UNIT/ML SOPN Inject 50 Units into the skin daily. Patient not taking: Reported on 12/04/2021 12/18/17   Etta Grandchild, MD  Insulin Pen Needle (NOVOFINE) 32G X 6 MM MISC 1 Act by Does not apply route daily. qd 05/13/17   Etta Grandchild, MD  omega-3 acid ethyl esters (LOVAZA) 1 g capsule Take 2 capsules (2 g total) by mouth 2 (two) times daily. Patient not taking: Reported on 12/04/2021 12/18/17   Etta Grandchild, MD  telmisartan (MICARDIS) 40 MG tablet Take 1 tablet (40 mg total) by mouth daily. Patient not taking: Reported on 12/04/2021 12/18/17   Etta Grandchild, MD      Allergies    Nsaids    Review of Systems   Review of Systems  Constitutional:  Negative for chills and fever.  Musculoskeletal:  Positive for arthralgias.  Skin:  Positive for wound.   Physical Exam Updated Vital Signs BP (!) 176/107    Pulse 90    Temp 97.9 F (36.6 C) (Oral)    Resp 18    SpO2 97%  Physical Exam Vitals and nursing note reviewed.  Constitutional:      Appearance: He is not ill-appearing.  HENT:     Head: Normocephalic and atraumatic.  Eyes:     Conjunctiva/sclera: Conjunctivae normal.  Cardiovascular:     Rate and Rhythm: Normal rate and regular rhythm.  Pulmonary:     Effort: Pulmonary effort is normal.     Breath sounds: Normal breath sounds.  Abdominal:     Palpations: Abdomen is soft.     Tenderness: There is no abdominal tenderness.  Musculoskeletal:     Cervical back: Neck supple.     Comments: See photo below. Approximately 12 cm deep laceration to right forearm along ulnar aspect. No evidence of muscle or tendon damage. ROM intact to elbow, wrist, and all digits. Able to make fist and open hand without difficulty. Cap refill < 2 seconds to all digits. 2+ radial pulse.   Skin:    General: Skin is warm and dry.     Coloration: Skin is not  jaundiced.  Neurological:     Mental Status: He is alert.      ED Results / Procedures / Treatments   Labs (all labs ordered are listed, but only abnormal results are displayed) Labs Reviewed - No data to display  EKG None  Radiology No results found.  Procedures .Marland KitchenLaceration Repair  Date/Time: 12/04/2021 9:03 PM Performed by: Tanda Rockers, PA-C Authorized by: Tanda Rockers, PA-C   Consent:    Consent obtained:  Verbal   Consent given by:  Patient   Risks discussed:  Infection, pain, poor cosmetic result, need for additional repair, nerve damage and poor wound healing Universal protocol:    Patient identity confirmed:  Verbally with patient Laceration details:    Location:  Shoulder/arm   Shoulder/arm location:  R lower arm   Length (cm):  12   Depth (mm):  6 Pre-procedure details:    Preparation:  Patient was prepped and draped in usual sterile fashion Treatment:    Area cleansed with:  Povidone-iodine   Irrigation solution:  Sterile saline   Irrigation volume:  1L NS   Layers/structures repaired:  Muscle fascia Muscle fascia:    Suture size:  3-0   Suture material:  Vicryl   Suture technique:  Horizontal mattress   Number of sutures:  10 Skin repair:    Repair method:  Sutures   Suture size:  3-0   Suture material:  Prolene   Suture technique:  Running locked   Number of sutures:  19 Approximation:    Approximation:  Close Repair type:    Repair type:  Complex Post-procedure details:    Dressing:  Non-adherent dressing   Procedure completion:  Tolerated well, no immediate complications    Medications Ordered in ED Medications  Tdap (BOOSTRIX) injection 0.5 mL (0.5 mLs Intramuscular Given 12/04/21 1926)  oxyCODONE-acetaminophen (PERCOCET/ROXICET) 5-325 MG per tablet 1 tablet (1 tablet Oral Given 12/04/21 1927)  lidocaine-EPINEPHrine (XYLOCAINE W/EPI) 2 %-1:200000 (PF) injection 20 mL (20 mLs Infiltration Given 12/04/21 1928)    ED Course/  Medical Decision Making/ A&P                           Medical Decision Making 56 year old male who presents to the ED today after sustaining a 12 cm deep laceration to his right forearm after reaching into the dishwasher and slicing his arm on a knife.  On arrival to the ED today vitals are stable.  Patient has pressure dressing applied that was applied at home by his wife who is a Engineer, civil (consulting).  Bleeding controlled at this time.  Will require extensive suture repair.  His tetanus is not up-to-date.  Will provide tetanus and pain medication in the ED.  He is neurovascularly intact throughout.  He has strong 2+ radial pulse.  Not feel he requires imaging at this time.  Sitter he is neurovascular intact do not feel he requires Ortho involvement at this time.   Sutures used without difficulty.  Dressing applied.  Patient instructed to return in 2 weeks time for suture removal.  He is instructed to return sooner for any signs of infection.  He is advised to take ibuprofen and Tylenol as needed for pain.  He is in agreement with plan and stable for discharge.     Problems Addressed: Laceration of right upper extremity, initial encounter: acute illness or injury  Risk Prescription drug management.          Final Clinical Impression(s) / ED Diagnoses Final diagnoses:  Laceration of right upper extremity, initial encounter    Rx / DC Orders ED Discharge Orders     None        Discharge Instructions      Please return to the ED in 2 weeks time for suture removal. Return sooner for any signs of infection including redness/swelling around the wound, drainage of pus, fevers > 100.4, red streaking up arm, or any other new/concerning symptoms.   Take Ibuprofen and Tylenol as needed for pain. You can apply ice to the area to help with inflammation.         Tanda Rockers, PA-C 12/04/21 2108    Pricilla Loveless, MD 12/05/21 [phone]

## 2021-12-04 NOTE — ED Notes (Signed)
Suture cart with suture tray and lidocaine with epi to bedside.

## 2021-12-04 NOTE — ED Notes (Signed)
Non-adhering dressing applied to right forearm wound covered with abd and kling and secured with tape.

## 2021-12-04 NOTE — Discharge Instructions (Signed)
Please return to the ED in 2 weeks time for suture removal. Return sooner for any signs of infection including redness/swelling around the wound, drainage of pus, fevers > 100.4, red streaking up arm, or any other new/concerning symptoms.   Take Ibuprofen and Tylenol as needed for pain. You can apply ice to the area to help with inflammation.

## 2021-12-04 NOTE — ED Triage Notes (Signed)
Laceration to right arm, cut with a knife
